# Patient Record
Sex: Female | Born: 1980
Health system: Southern US, Community
[De-identification: ages and names within clinical notes are randomized; demographics above are authoritative.]

## PROBLEM LIST (undated history)

## (undated) DIAGNOSIS — M549 Dorsalgia, unspecified: Secondary | ICD-10-CM

## (undated) DIAGNOSIS — Z87442 Personal history of urinary calculi: Secondary | ICD-10-CM

## (undated) DIAGNOSIS — Z789 Other specified health status: Secondary | ICD-10-CM

## (undated) DIAGNOSIS — F329 Major depressive disorder, single episode, unspecified: Secondary | ICD-10-CM

## (undated) DIAGNOSIS — D649 Anemia, unspecified: Secondary | ICD-10-CM

## (undated) DIAGNOSIS — F319 Bipolar disorder, unspecified: Secondary | ICD-10-CM

## (undated) DIAGNOSIS — D509 Iron deficiency anemia, unspecified: Secondary | ICD-10-CM

## (undated) DIAGNOSIS — K219 Gastro-esophageal reflux disease without esophagitis: Secondary | ICD-10-CM

## (undated) DIAGNOSIS — E282 Polycystic ovarian syndrome: Secondary | ICD-10-CM

## (undated) DIAGNOSIS — G8929 Other chronic pain: Secondary | ICD-10-CM

## (undated) DIAGNOSIS — T7840XA Allergy, unspecified, initial encounter: Secondary | ICD-10-CM

## (undated) DIAGNOSIS — M797 Fibromyalgia: Secondary | ICD-10-CM

## (undated) DIAGNOSIS — K56609 Unspecified intestinal obstruction, unspecified as to partial versus complete obstruction: Secondary | ICD-10-CM

## (undated) DIAGNOSIS — R51 Headache: Secondary | ICD-10-CM

## (undated) DIAGNOSIS — O24419 Gestational diabetes mellitus in pregnancy, unspecified control: Secondary | ICD-10-CM

## (undated) DIAGNOSIS — F32A Depression, unspecified: Secondary | ICD-10-CM

## (undated) DIAGNOSIS — E785 Hyperlipidemia, unspecified: Secondary | ICD-10-CM

## (undated) DIAGNOSIS — E079 Disorder of thyroid, unspecified: Secondary | ICD-10-CM

## (undated) DIAGNOSIS — E039 Hypothyroidism, unspecified: Secondary | ICD-10-CM

## (undated) DIAGNOSIS — E559 Vitamin D deficiency, unspecified: Secondary | ICD-10-CM

## (undated) DIAGNOSIS — R519 Headache, unspecified: Secondary | ICD-10-CM

## (undated) DIAGNOSIS — F909 Attention-deficit hyperactivity disorder, unspecified type: Secondary | ICD-10-CM

## (undated) DIAGNOSIS — M47816 Spondylosis without myelopathy or radiculopathy, lumbar region: Secondary | ICD-10-CM

## (undated) DIAGNOSIS — N3010 Interstitial cystitis (chronic) without hematuria: Secondary | ICD-10-CM

## (undated) DIAGNOSIS — F419 Anxiety disorder, unspecified: Secondary | ICD-10-CM

## (undated) DIAGNOSIS — Z9289 Personal history of other medical treatment: Secondary | ICD-10-CM

## (undated) DIAGNOSIS — K589 Irritable bowel syndrome without diarrhea: Secondary | ICD-10-CM

## (undated) HISTORY — DX: Polycystic ovarian syndrome: E28.2

## (undated) HISTORY — DX: Spondylosis without myelopathy or radiculopathy, lumbar region: M47.816

## (undated) HISTORY — DX: Vitamin D deficiency, unspecified: E55.9

## (undated) HISTORY — PX: SPINAL CORD STIMULATOR IMPLANT: SHX2422

## (undated) HISTORY — PX: COLONOSCOPY: SHX174

## (undated) HISTORY — PX: WISDOM TOOTH EXTRACTION: SHX21

## (undated) HISTORY — DX: Gastro-esophageal reflux disease without esophagitis: K21.9

## (undated) HISTORY — DX: Allergy, unspecified, initial encounter: T78.40XA

## (undated) HISTORY — PX: UPPER GI ENDOSCOPY: SHX6162

## (undated) HISTORY — DX: Interstitial cystitis (chronic) without hematuria: N30.10

## (undated) HISTORY — PX: SPINAL CORD STIMULATOR REMOVAL: SHX2423

## (undated) HISTORY — DX: Iron deficiency anemia, unspecified: D50.9

## (undated) HISTORY — PX: OTHER SURGICAL HISTORY: SHX169

## (undated) HISTORY — DX: Irritable bowel syndrome, unspecified: K58.9

---

## 2001-12-09 ENCOUNTER — Other Ambulatory Visit: Admission: RE | Admit: 2001-12-09 | Discharge: 2001-12-09 | Payer: Self-pay | Admitting: Obstetrics & Gynecology

## 2002-08-31 ENCOUNTER — Ambulatory Visit (HOSPITAL_BASED_OUTPATIENT_CLINIC_OR_DEPARTMENT_OTHER): Admission: RE | Admit: 2002-08-31 | Discharge: 2002-08-31 | Payer: Self-pay | Admitting: Urology

## 2002-12-25 ENCOUNTER — Ambulatory Visit (HOSPITAL_COMMUNITY): Admission: RE | Admit: 2002-12-25 | Discharge: 2002-12-25 | Payer: Self-pay | Admitting: Family Medicine

## 2002-12-25 ENCOUNTER — Other Ambulatory Visit: Admission: RE | Admit: 2002-12-25 | Discharge: 2002-12-25 | Payer: Self-pay | Admitting: Obstetrics & Gynecology

## 2002-12-25 ENCOUNTER — Encounter: Payer: Self-pay | Admitting: Family Medicine

## 2003-03-13 ENCOUNTER — Emergency Department (HOSPITAL_COMMUNITY): Admission: EM | Admit: 2003-03-13 | Discharge: 2003-03-13 | Payer: Self-pay | Admitting: Emergency Medicine

## 2003-06-21 ENCOUNTER — Ambulatory Visit (HOSPITAL_COMMUNITY): Admission: RE | Admit: 2003-06-21 | Discharge: 2003-06-21 | Payer: Self-pay | Admitting: Urology

## 2003-06-21 ENCOUNTER — Ambulatory Visit (HOSPITAL_BASED_OUTPATIENT_CLINIC_OR_DEPARTMENT_OTHER): Admission: RE | Admit: 2003-06-21 | Discharge: 2003-06-21 | Payer: Self-pay | Admitting: Urology

## 2003-12-21 ENCOUNTER — Other Ambulatory Visit: Admission: RE | Admit: 2003-12-21 | Discharge: 2003-12-21 | Payer: Self-pay | Admitting: Obstetrics & Gynecology

## 2004-01-19 ENCOUNTER — Ambulatory Visit (HOSPITAL_BASED_OUTPATIENT_CLINIC_OR_DEPARTMENT_OTHER): Admission: RE | Admit: 2004-01-19 | Discharge: 2004-01-19 | Payer: Self-pay | Admitting: Urology

## 2004-08-02 ENCOUNTER — Ambulatory Visit (HOSPITAL_COMMUNITY): Admission: RE | Admit: 2004-08-02 | Discharge: 2004-08-02 | Payer: Self-pay | Admitting: Urology

## 2004-08-02 ENCOUNTER — Ambulatory Visit (HOSPITAL_BASED_OUTPATIENT_CLINIC_OR_DEPARTMENT_OTHER): Admission: RE | Admit: 2004-08-02 | Discharge: 2004-08-02 | Payer: Self-pay | Admitting: Urology

## 2004-12-27 ENCOUNTER — Other Ambulatory Visit: Admission: RE | Admit: 2004-12-27 | Discharge: 2004-12-27 | Payer: Self-pay | Admitting: Obstetrics & Gynecology

## 2006-01-23 ENCOUNTER — Ambulatory Visit (HOSPITAL_COMMUNITY): Admission: RE | Admit: 2006-01-23 | Discharge: 2006-01-23 | Payer: Self-pay | Admitting: Obstetrics & Gynecology

## 2007-03-06 DIAGNOSIS — O1490 Unspecified pre-eclampsia, unspecified trimester: Secondary | ICD-10-CM

## 2007-03-06 HISTORY — DX: Unspecified pre-eclampsia, unspecified trimester: O14.90

## 2007-04-08 ENCOUNTER — Encounter: Admission: RE | Admit: 2007-04-08 | Discharge: 2007-04-14 | Payer: Self-pay | Admitting: Obstetrics and Gynecology

## 2007-04-16 ENCOUNTER — Inpatient Hospital Stay (HOSPITAL_COMMUNITY): Admission: AD | Admit: 2007-04-16 | Discharge: 2007-04-16 | Payer: Self-pay | Admitting: Obstetrics & Gynecology

## 2007-04-17 ENCOUNTER — Inpatient Hospital Stay (HOSPITAL_COMMUNITY): Admission: AD | Admit: 2007-04-17 | Discharge: 2007-04-17 | Payer: Self-pay | Admitting: Obstetrics & Gynecology

## 2007-04-18 ENCOUNTER — Inpatient Hospital Stay (HOSPITAL_COMMUNITY): Admission: AD | Admit: 2007-04-18 | Discharge: 2007-04-23 | Payer: Self-pay | Admitting: Obstetrics and Gynecology

## 2007-04-20 ENCOUNTER — Encounter (INDEPENDENT_AMBULATORY_CARE_PROVIDER_SITE_OTHER): Payer: Self-pay | Admitting: Obstetrics and Gynecology

## 2007-04-24 ENCOUNTER — Encounter: Admission: RE | Admit: 2007-04-24 | Discharge: 2007-05-21 | Payer: Self-pay | Admitting: Urology

## 2007-05-22 ENCOUNTER — Encounter: Admission: RE | Admit: 2007-05-22 | Discharge: 2007-06-21 | Payer: Self-pay | Admitting: Obstetrics & Gynecology

## 2007-07-17 ENCOUNTER — Encounter: Admission: RE | Admit: 2007-07-17 | Discharge: 2007-07-17 | Payer: Self-pay | Admitting: Rheumatology

## 2008-06-03 DIAGNOSIS — N3010 Interstitial cystitis (chronic) without hematuria: Secondary | ICD-10-CM

## 2008-06-03 HISTORY — DX: Interstitial cystitis (chronic) without hematuria: N30.10

## 2009-02-17 ENCOUNTER — Emergency Department (HOSPITAL_COMMUNITY): Admission: EM | Admit: 2009-02-17 | Discharge: 2009-02-17 | Payer: Self-pay | Admitting: Emergency Medicine

## 2010-02-02 DIAGNOSIS — Z9289 Personal history of other medical treatment: Secondary | ICD-10-CM

## 2010-02-02 HISTORY — PX: ABDOMINAL HYSTERECTOMY: SHX81

## 2010-02-02 HISTORY — DX: Personal history of other medical treatment: Z92.89

## 2010-02-02 HISTORY — PX: APPENDECTOMY: SHX54

## 2010-02-02 HISTORY — PX: BLADDER REMOVAL: SHX567

## 2010-03-26 ENCOUNTER — Encounter: Payer: Self-pay | Admitting: Internal Medicine

## 2010-06-05 LAB — CBC
HCT: 42 % (ref 36.0–46.0)
MCV: 84.1 fL (ref 78.0–100.0)
Platelets: 291 10*3/uL (ref 150–400)
RBC: 5 MIL/uL (ref 3.87–5.11)
WBC: 10.3 10*3/uL (ref 4.0–10.5)

## 2010-06-05 LAB — COMPREHENSIVE METABOLIC PANEL
ALT: 22 U/L (ref 0–35)
AST: 23 U/L (ref 0–37)
Albumin: 3.7 g/dL (ref 3.5–5.2)
Alkaline Phosphatase: 84 U/L (ref 39–117)
Chloride: 103 mEq/L (ref 96–112)
Potassium: 3.7 mEq/L (ref 3.5–5.1)
Sodium: 134 mEq/L — ABNORMAL LOW (ref 135–145)
Total Bilirubin: 0.3 mg/dL (ref 0.3–1.2)
Total Protein: 7.1 g/dL (ref 6.0–8.3)

## 2010-06-05 LAB — DIFFERENTIAL
Eosinophils Absolute: 0.2 10*3/uL (ref 0.0–0.7)
Eosinophils Relative: 2 % (ref 0–5)
Lymphocytes Relative: 37 % (ref 12–46)
Lymphs Abs: 3.8 10*3/uL (ref 0.7–4.0)
Monocytes Relative: 8 % (ref 3–12)
Neutrophils Relative %: 52 % (ref 43–77)

## 2010-06-05 LAB — URINALYSIS, ROUTINE W REFLEX MICROSCOPIC
Glucose, UA: NEGATIVE mg/dL
Nitrite: NEGATIVE
Specific Gravity, Urine: 1.035 — ABNORMAL HIGH (ref 1.005–1.030)
pH: 7.5 (ref 5.0–8.0)

## 2010-06-05 LAB — PREGNANCY, URINE: Preg Test, Ur: NEGATIVE

## 2010-07-18 NOTE — Discharge Summary (Signed)
NAMEJORDIE, [name]            ACCOUNT NO.:  [id number]   MEDICAL RECORD NO.:  [id number]          PATIENT TYPE:  INP   LOCATION:  9306                          FACILITY:  [facility]   PHYSICIAN:  Carrington Clamp, M.D. DATE OF BIRTH:  [birth year]   DATE OF ADMISSION:  04/18/2007  DATE OF DISCHARGE:  04/23/2007                               DISCHARGE SUMMARY   FINAL DIAGNOSES:  1. Intrauterine gestation at 30 weeks.  2. Severe preeclampsia.  3. Unfavorable cervix.   PROCEDURE:  Primary low transverse cesarean section.  Surgeon Dr. Malva Limes.   COMPLICATIONS:  None.   HISTORY OF PRESENT ILLNESS:  This 30 year old G2, P 0-0-1-0 presents at  [redacted] weeks gestation and is admitted for preeclampsia.  The patient had  some elevated blood pressures in the office and had a 24-hour urine with  over close to 1680 grams of protein, pH labs at this point were okay.  The patient does have a history of hypothyroidism, depression,  fibromyalgia and gestational diabetes which is diet controlled at this  point.  The patient was admitted, started on Procardia, bed rest and  betamethasone protocol and to be monitored closely.  Left fetal maternal  medicine consult was obtained and ultrasound was performed.  Checked  with an AFI.  The patient was started on mag sulfate for prophylaxis.  She did have some elevated uric acid starting the patient on her PIH  labs.  The patient now starts having symptoms of headache and visual  changes at this point.  Because of this, will proceed with a cesarean  section.  The patient was taken to the operating room on April 20, 2007 by Dr. Malva Limes where a primary low transverse cesarean  section was performed with the delivery of a 3 pounds 2 ounces female  infant with Apgars of 7 and 9.  The NICU was awaiting and took the baby.  The patient was in stable condition.  The patient's postoperative course  was complicated by continued lab changes and elevated  blood pressure.  The patient was kept on mag sulfate and by postoperative day #2 the  patient was feeling okay.  She had good urine output and blood pressures  looked stable.  Mag sulfate was stopped.  The patient was started on  oral Procardia XL 60 mg daily and transferred to the Mother/Baby unit.  Postoperative day #3, the patient was still voiding well.  Liver  function tests and uric acid had returned to normal.  The patient was  felt ready for discharge.  She was sent home on a regular diet, told to  decrease activities, told to continue her Procardia XL 60 mg to take  daily.  Was given Percocet 1-2 every 4-6 hours as needed for her pain.  Follow-up in our office in 1-2 weeks for blood pressure check.  Instructions and precautions were reviewed with the patient as well.   LABORATORIES ON DISCHARGE:  The patient had a hemoglobin of 11.9, white  blood cell count of 15.6, platelets 264,000.  Her final labs on February  17 showed continued normal liver function  tests, normal LDH as well as a  normalizing uric acid level.      Leilani Able, P.A.-C.      Carrington Clamp, M.D.  Electronically Signed    MB/MEDQ  D:  05/13/2007  T:  05/14/2007  Job:  [id number]

## 2010-07-18 NOTE — Op Note (Signed)
[name], [name]            ACCOUNT NO.:  [id number]   MEDICAL RECORD NO.:  [id number]          PATIENT TYPE:  INP   LOCATION:  9371                          FACILITY:  [facility]   PHYSICIAN:  Malva Limes, M.D.    DATE OF BIRTH:  1980/06/04   DATE OF PROCEDURE:  04/20/2007  DATE OF DISCHARGE:                               OPERATIVE REPORT   PREOPERATIVE DIAGNOSES:  1. Intrauterine pregnancy at 30 weeks estimated gestational age.  2. Severe pre-eclampsia.  3. Unfavorable cervix.   POSTOPERATIVE DIAGNOSES:  1. Intrauterine pregnancy at 30 weeks estimated gestational age.  2. Severe pre-eclampsia.  3. Unfavorable cervix.   PROCEDURE:  Primary low transverse Cesarean section.   SURGEON:  Malva Limes, MD   ANESTHESIA:  Spinal.   ANTIBIOTICS:  Clindamycin 900 mg IV x1.   DRAINS:  Foley to bedside drainage.   ESTIMATED BLOOD LOSS:  900 mL.   COMPLICATIONS:  None.   FINDINGS:  The patient had normal Fallopian tubes and ovaries  bilaterally.  The uterus appeared to be normal.  The patient delivered 1  live viable white female infant in the vertex presentation.  Amniotic  fluid was noted to be clear.   PROCEDURE IN DETAIL:  The patient was taken to the operating room where  a spinal anesthetic was administered without difficulty.  She was then  placed in dorsal supine position.  Once an adequate level was reached,  she was prepped and draped in the usual fashion for this procedure.  A  Foley catheter was placed.  A Pfannenstiel incision was then made.  On  entering the peritoneal cavity the bladder flap was taken down with  sharp dissection.  A low transverse uterine incision was made in the  midline and extended laterally with blunt dissection.  Amniotic fluid  was noted to be clear.  The infant was delivered in the vertex  presentation.  The cords doubly clamped and cut, and the infant handed  to the awaiting NICU team.   The placenta was then manually removed.  The  uterus was exteriorized.  The uterine cavity was cleaned with a wet lap.  The uterine incision was  closed in a single layer of 0 Monocryl suture in a running, locking  fashion.  The bladder flap was closed using 0 Monocryl suture in a  running fashion.  The uterus was placed back in the abdominal cavity.  Hemostasis was checked and felt to be adequate.  The parietoperitoneum  was then closed using 2-0 Monocryl in a running fashion.  The rectus  muscles were reapproximated in the midline using 2-0 Monocryl in a  running fashion.  The fascia was then closed using 0 Monocryl suture in  a running fashion.  The subcuticular tissue was made hemostatic with a  Bovie.  The subcuticular tissue was closed with interrupted 0 plain gut  suture.  Stainless steel clips were used to close the skin.  The patient  tolerated the procedure well.  She was taken to the recovery room in  stable condition.  Instrument and lap counts are correct x2.  ______________________________  Malva Limes, M.D.     MA/MEDQ  D:  04/20/2007  T:  04/22/2007  Job:  [id number]

## 2010-11-24 LAB — CBC
HCT: 37.4
HCT: 32.6 — ABNORMAL LOW
Hemoglobin: 12.7
MCHC: 33.9
MCHC: 34.2
MCHC: 34.6
MCHC: 34.7
MCHC: 35
MCV: 83.1
MCV: 82.6
MCV: 83
Platelets: 256
Platelets: 291
Platelets: 306
Platelets: 328
RBC: 4.53
RBC: 3.94
RBC: 4.53
RDW: 13.7
RDW: 13.7
RDW: 13.7
WBC: 20.6 — ABNORMAL HIGH

## 2010-11-24 LAB — COMPREHENSIVE METABOLIC PANEL
ALT: 23
ALT: 16
ALT: 19
ALT: 21
AST: 20
AST: 26
AST: 28
Albumin: 2.6 — ABNORMAL LOW
Albumin: 2.1 — ABNORMAL LOW
Albumin: 2.6 — ABNORMAL LOW
Alkaline Phosphatase: 67
Alkaline Phosphatase: 64
BUN: 17
CO2: 22
CO2: 24
CO2: 23
Calcium: 9.5
Calcium: 8.2 — ABNORMAL LOW
Calcium: 8.7
Chloride: 102
Chloride: 103
Creatinine, Ser: 0.71
Creatinine, Ser: 0.7
Creatinine, Ser: 0.82
GFR calc Af Amer: >60
GFR calc Af Amer: >60
GFR calc Af Amer: >60
GFR calc non Af Amer: >60
GFR calc non Af Amer: >60
GFR calc non Af Amer: >60
Glucose, Bld: 76
Potassium: 4
Potassium: 4.1
Sodium: 136
Sodium: 132 — ABNORMAL LOW
Sodium: 133 — ABNORMAL LOW
Sodium: 137
Total Bilirubin: 0.3
Total Protein: 5.7 — ABNORMAL LOW
Total Protein: 4.5 — ABNORMAL LOW
Total Protein: 5.5 — ABNORMAL LOW

## 2010-11-24 LAB — PROTEIN, URINE, 24 HOUR
Collection Interval-UPROT: 24
Collection Interval-UPROT: 24
Protein, Urine: 48
Protein, Urine: 223
Urine Total Volume-UPROT: 3500

## 2010-11-24 LAB — GLUCOSE, RANDOM: Glucose, Bld: 85

## 2010-11-24 LAB — LACTATE DEHYDROGENASE
LDH: 180
LDH: 148
LDH: 223

## 2010-11-24 LAB — MAGNESIUM: Magnesium: 5.7 — ABNORMAL HIGH

## 2010-11-24 LAB — URIC ACID: Uric Acid, Serum: 7.2 — ABNORMAL HIGH

## 2010-11-24 LAB — CREATININE CLEARANCE, URINE, 24 HOUR
Collection Interval-CRCL: 24
Creatinine Clearance: 137 — ABNORMAL HIGH
Creatinine Clearance: 141 — ABNORMAL HIGH
Creatinine, 24H Ur: 1404
Urine Total Volume-CRCL: 3500
Urine Total Volume-CRCL: 4300

## 2010-12-22 ENCOUNTER — Emergency Department (HOSPITAL_COMMUNITY)
Admission: EM | Admit: 2010-12-22 | Discharge: 2010-12-22 | Disposition: A | Discharge status: Home / Self Care | Payer: 59 | Attending: Emergency Medicine | Admitting: Emergency Medicine

## 2010-12-22 ENCOUNTER — Emergency Department (HOSPITAL_COMMUNITY): Payer: 59

## 2010-12-22 DIAGNOSIS — R109 Unspecified abdominal pain: Secondary | ICD-10-CM | POA: Insufficient documentation

## 2010-12-22 DIAGNOSIS — IMO0001 Reserved for inherently not codable concepts without codable children: Secondary | ICD-10-CM | POA: Insufficient documentation

## 2010-12-22 DIAGNOSIS — E039 Hypothyroidism, unspecified: Secondary | ICD-10-CM | POA: Insufficient documentation

## 2010-12-22 DIAGNOSIS — N760 Acute vaginitis: Secondary | ICD-10-CM | POA: Insufficient documentation

## 2010-12-22 DIAGNOSIS — F329 Major depressive disorder, single episode, unspecified: Secondary | ICD-10-CM | POA: Insufficient documentation

## 2010-12-22 DIAGNOSIS — R6889 Other general symptoms and signs: Secondary | ICD-10-CM | POA: Insufficient documentation

## 2010-12-22 DIAGNOSIS — F3289 Other specified depressive episodes: Secondary | ICD-10-CM | POA: Insufficient documentation

## 2010-12-22 DIAGNOSIS — Z79899 Other long term (current) drug therapy: Secondary | ICD-10-CM | POA: Insufficient documentation

## 2010-12-22 LAB — WET PREP, GENITAL: Trich, Wet Prep: NONE SEEN

## 2010-12-22 LAB — CBC
MCV: 85.4 fL (ref 78.0–100.0)
Platelets: 289 10*3/uL (ref 150–400)
RBC: 4.72 MIL/uL (ref 3.87–5.11)
WBC: 8.9 10*3/uL (ref 4.0–10.5)

## 2010-12-22 LAB — BASIC METABOLIC PANEL
CO2: 22 mEq/L (ref 19–32)
Chloride: 108 mEq/L (ref 96–112)
Creatinine, Ser: 0.8 mg/dL (ref 0.50–1.10)
Potassium: 4.2 mEq/L (ref 3.5–5.1)
Sodium: 138 mEq/L (ref 135–145)

## 2010-12-22 LAB — DIFFERENTIAL
Eosinophils Absolute: 0.1 10*3/uL (ref 0.0–0.7)
Lymphocytes Relative: 27 % (ref 12–46)
Lymphs Abs: 2.4 10*3/uL (ref 0.7–4.0)
Neutrophils Relative %: 65 % (ref 43–77)

## 2010-12-23 LAB — GC/CHLAMYDIA PROBE AMP, GENITAL: GC Probe Amp, Genital: NEGATIVE

## 2011-02-21 DIAGNOSIS — M791 Myalgia, unspecified site: Secondary | ICD-10-CM | POA: Insufficient documentation

## 2011-02-21 DIAGNOSIS — Z906 Acquired absence of other parts of urinary tract: Secondary | ICD-10-CM | POA: Insufficient documentation

## 2011-03-27 DIAGNOSIS — F319 Bipolar disorder, unspecified: Secondary | ICD-10-CM | POA: Insufficient documentation

## 2013-04-07 ENCOUNTER — Other Ambulatory Visit: Payer: Self-pay | Admitting: Dermatology

## 2013-10-06 DIAGNOSIS — M25511 Pain in right shoulder: Secondary | ICD-10-CM | POA: Insufficient documentation

## 2013-10-06 DIAGNOSIS — R1013 Epigastric pain: Secondary | ICD-10-CM | POA: Insufficient documentation

## 2014-02-16 ENCOUNTER — Emergency Department (HOSPITAL_COMMUNITY): Payer: No Typology Code available for payment source

## 2014-02-16 ENCOUNTER — Encounter (HOSPITAL_COMMUNITY): Payer: Self-pay

## 2014-02-16 ENCOUNTER — Emergency Department (HOSPITAL_COMMUNITY)
Admission: EM | Admit: 2014-02-16 | Discharge: 2014-02-16 | Disposition: A | Discharge status: Home / Self Care | Payer: No Typology Code available for payment source | Attending: Emergency Medicine | Admitting: Emergency Medicine

## 2014-02-16 DIAGNOSIS — R1031 Right lower quadrant pain: Secondary | ICD-10-CM

## 2014-02-16 DIAGNOSIS — Z8739 Personal history of other diseases of the musculoskeletal system and connective tissue: Secondary | ICD-10-CM | POA: Diagnosis not present

## 2014-02-16 DIAGNOSIS — Z880 Allergy status to penicillin: Secondary | ICD-10-CM | POA: Insufficient documentation

## 2014-02-16 DIAGNOSIS — R52 Pain, unspecified: Secondary | ICD-10-CM

## 2014-02-16 DIAGNOSIS — Y92410 Unspecified street and highway as the place of occurrence of the external cause: Secondary | ICD-10-CM | POA: Diagnosis not present

## 2014-02-16 DIAGNOSIS — Y9389 Activity, other specified: Secondary | ICD-10-CM | POA: Diagnosis not present

## 2014-02-16 DIAGNOSIS — S50811A Abrasion of right forearm, initial encounter: Secondary | ICD-10-CM | POA: Diagnosis not present

## 2014-02-16 DIAGNOSIS — S3991XA Unspecified injury of abdomen, initial encounter: Secondary | ICD-10-CM | POA: Diagnosis not present

## 2014-02-16 DIAGNOSIS — S299XXA Unspecified injury of thorax, initial encounter: Secondary | ICD-10-CM | POA: Diagnosis present

## 2014-02-16 DIAGNOSIS — S20211A Contusion of right front wall of thorax, initial encounter: Secondary | ICD-10-CM | POA: Diagnosis not present

## 2014-02-16 DIAGNOSIS — Y998 Other external cause status: Secondary | ICD-10-CM | POA: Insufficient documentation

## 2014-02-16 HISTORY — DX: Fibromyalgia: M79.7

## 2014-02-16 LAB — COMPREHENSIVE METABOLIC PANEL
ALK PHOS: 82 U/L (ref 39–117)
ALT: 39 U/L — AB (ref 0–35)
AST: 33 U/L (ref 0–37)
Albumin: 4.3 g/dL (ref 3.5–5.2)
Anion gap: 14 (ref 5–15)
BUN: 18 mg/dL (ref 6–23)
CO2: 19 mEq/L (ref 19–32)
Calcium: 9.3 mg/dL (ref 8.4–10.5)
Chloride: 104 mEq/L (ref 96–112)
Creatinine, Ser: 0.78 mg/dL (ref 0.50–1.10)
GFR calc Af Amer: >90 mL/min (ref >90)
GFR calc non Af Amer: >90 mL/min (ref >90)
Glucose, Bld: 101 mg/dL — ABNORMAL HIGH (ref 70–99)
Potassium: 4.4 mEq/L (ref 3.7–5.3)
SODIUM: 137 meq/L (ref 137–147)
TOTAL PROTEIN: 8.4 g/dL — AB (ref 6.0–8.3)
Total Bilirubin: <0.2 mg/dL — ABNORMAL LOW (ref 0.3–1.2)

## 2014-02-16 LAB — CBC WITH DIFFERENTIAL/PLATELET
Basophils Absolute: 0 10*3/uL (ref 0.0–0.1)
Basophils Relative: 0 % (ref 0–1)
EOS ABS: 0 10*3/uL (ref 0.0–0.7)
Eosinophils Relative: 0 % (ref 0–5)
HCT: 40.9 % (ref 36.0–46.0)
Hemoglobin: 13.2 g/dL (ref 12.0–15.0)
Lymphocytes Relative: 16 % (ref 12–46)
Lymphs Abs: 2 10*3/uL (ref 0.7–4.0)
MCH: 27.4 pg (ref 26.0–34.0)
MCHC: 32.3 g/dL (ref 30.0–36.0)
MCV: 85 fL (ref 78.0–100.0)
Monocytes Absolute: 1 10*3/uL (ref 0.1–1.0)
Monocytes Relative: 8 % (ref 3–12)
NEUTROS PCT: 76 % (ref 43–77)
Neutro Abs: 9.9 10*3/uL — ABNORMAL HIGH (ref 1.7–7.7)
PLATELETS: 358 10*3/uL (ref 150–400)
RBC: 4.81 MIL/uL (ref 3.87–5.11)
RDW: 13 % (ref 11.5–15.5)
WBC: 12.9 10*3/uL — ABNORMAL HIGH (ref 4.0–10.5)

## 2014-02-16 MED ORDER — IOHEXOL 300 MG/ML  SOLN
100.0000 mL | Freq: Once | INTRAMUSCULAR | Status: AC | PRN
  Administered 2014-02-16: 100 mL via INTRAVENOUS

## 2014-02-16 MED ORDER — OXYCODONE-ACETAMINOPHEN 5-325 MG PO TABS: 2.0000 | ORAL_TABLET | ORAL | Status: DC | PRN

## 2014-02-16 MED ORDER — HYDROMORPHONE HCL 1 MG/ML IJ SOLN
1.0000 mg | Freq: Once | INTRAMUSCULAR | Status: AC
  Administered 2014-02-16: 1 mg via INTRAVENOUS
  Filled 2014-02-16: qty 1
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
## 2014-02-16 NOTE — ED Notes (Signed)
CT notified pt had hysterectomy, will not need urine preg.
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
## 2014-02-16 NOTE — ED Notes (Signed)
Pt a/o x 4 on d/c with steady gait. 

## 2014-10-07 ENCOUNTER — Other Ambulatory Visit: Payer: Self-pay | Admitting: Obstetrics & Gynecology

## 2014-10-07 DIAGNOSIS — N644 Mastodynia: Secondary | ICD-10-CM

## 2014-10-18 DIAGNOSIS — R319 Hematuria, unspecified: Secondary | ICD-10-CM | POA: Insufficient documentation

## 2014-10-27 ENCOUNTER — Ambulatory Visit
Admission: RE | Admit: 2014-10-27 | Discharge: 2014-10-27 | Disposition: A | Discharge status: Home / Self Care | Payer: BLUE CROSS/BLUE SHIELD | Source: Ambulatory Visit | Attending: Obstetrics & Gynecology | Admitting: Obstetrics & Gynecology

## 2014-10-27 DIAGNOSIS — N644 Mastodynia: Secondary | ICD-10-CM

## 2015-03-06 HISTORY — PX: SMALL INTESTINE SURGERY: SHX150

## 2015-06-06 DIAGNOSIS — G8929 Other chronic pain: Secondary | ICD-10-CM | POA: Diagnosis not present

## 2015-06-06 DIAGNOSIS — G894 Chronic pain syndrome: Secondary | ICD-10-CM | POA: Diagnosis not present

## 2015-06-06 DIAGNOSIS — M25511 Pain in right shoulder: Secondary | ICD-10-CM | POA: Diagnosis not present

## 2015-06-06 DIAGNOSIS — R102 Pelvic and perineal pain: Secondary | ICD-10-CM | POA: Diagnosis not present

## 2015-06-24 DIAGNOSIS — E781 Pure hyperglyceridemia: Secondary | ICD-10-CM | POA: Diagnosis not present

## 2015-06-24 DIAGNOSIS — E039 Hypothyroidism, unspecified: Secondary | ICD-10-CM | POA: Diagnosis not present

## 2015-06-24 DIAGNOSIS — R339 Retention of urine, unspecified: Secondary | ICD-10-CM | POA: Diagnosis not present

## 2015-06-27 DIAGNOSIS — R945 Abnormal results of liver function studies: Secondary | ICD-10-CM | POA: Diagnosis not present

## 2015-06-27 DIAGNOSIS — E781 Pure hyperglyceridemia: Secondary | ICD-10-CM | POA: Diagnosis not present

## 2015-06-27 DIAGNOSIS — E039 Hypothyroidism, unspecified: Secondary | ICD-10-CM | POA: Diagnosis not present

## 2015-06-27 DIAGNOSIS — E119 Type 2 diabetes mellitus without complications: Secondary | ICD-10-CM | POA: Diagnosis not present

## 2015-06-30 DIAGNOSIS — Z5181 Encounter for therapeutic drug level monitoring: Secondary | ICD-10-CM | POA: Diagnosis not present

## 2015-06-30 DIAGNOSIS — G894 Chronic pain syndrome: Secondary | ICD-10-CM | POA: Diagnosis not present

## 2015-06-30 DIAGNOSIS — Z79899 Other long term (current) drug therapy: Secondary | ICD-10-CM | POA: Diagnosis not present

## 2015-06-30 DIAGNOSIS — M25511 Pain in right shoulder: Secondary | ICD-10-CM | POA: Diagnosis not present

## 2015-06-30 DIAGNOSIS — G8929 Other chronic pain: Secondary | ICD-10-CM | POA: Diagnosis not present

## 2015-06-30 DIAGNOSIS — R102 Pelvic and perineal pain: Secondary | ICD-10-CM | POA: Diagnosis not present

## 2015-07-25 DIAGNOSIS — R339 Retention of urine, unspecified: Secondary | ICD-10-CM | POA: Diagnosis not present

## 2015-07-28 DIAGNOSIS — G894 Chronic pain syndrome: Secondary | ICD-10-CM | POA: Diagnosis not present

## 2015-07-28 DIAGNOSIS — M25511 Pain in right shoulder: Secondary | ICD-10-CM | POA: Diagnosis not present

## 2015-07-28 DIAGNOSIS — G8929 Other chronic pain: Secondary | ICD-10-CM | POA: Diagnosis not present

## 2015-07-28 DIAGNOSIS — R102 Pelvic and perineal pain: Secondary | ICD-10-CM | POA: Diagnosis not present

## 2015-08-03 ENCOUNTER — Observation Stay (HOSPITAL_COMMUNITY): Payer: BLUE CROSS/BLUE SHIELD

## 2015-08-03 ENCOUNTER — Inpatient Hospital Stay (HOSPITAL_COMMUNITY)
Admission: EM | Admit: 2015-08-03 | Discharge: 2015-08-05 | DRG: 390 | Disposition: A | Discharge status: Home / Self Care | Payer: BLUE CROSS/BLUE SHIELD | Attending: Internal Medicine | Admitting: Internal Medicine

## 2015-08-03 ENCOUNTER — Encounter (HOSPITAL_COMMUNITY): Payer: Self-pay | Admitting: *Deleted

## 2015-08-03 ENCOUNTER — Emergency Department (HOSPITAL_COMMUNITY): Payer: BLUE CROSS/BLUE SHIELD

## 2015-08-03 DIAGNOSIS — Z906 Acquired absence of other parts of urinary tract: Secondary | ICD-10-CM | POA: Diagnosis not present

## 2015-08-03 DIAGNOSIS — F909 Attention-deficit hyperactivity disorder, unspecified type: Secondary | ICD-10-CM

## 2015-08-03 DIAGNOSIS — G4700 Insomnia, unspecified: Secondary | ICD-10-CM | POA: Diagnosis present

## 2015-08-03 DIAGNOSIS — F419 Anxiety disorder, unspecified: Secondary | ICD-10-CM | POA: Diagnosis present

## 2015-08-03 DIAGNOSIS — K56609 Unspecified intestinal obstruction, unspecified as to partial versus complete obstruction: Secondary | ICD-10-CM | POA: Diagnosis present

## 2015-08-03 DIAGNOSIS — E669 Obesity, unspecified: Secondary | ICD-10-CM | POA: Diagnosis present

## 2015-08-03 DIAGNOSIS — R109 Unspecified abdominal pain: Secondary | ICD-10-CM

## 2015-08-03 DIAGNOSIS — K5660 Unspecified intestinal obstruction: Secondary | ICD-10-CM | POA: Diagnosis not present

## 2015-08-03 DIAGNOSIS — E039 Hypothyroidism, unspecified: Secondary | ICD-10-CM

## 2015-08-03 DIAGNOSIS — R933 Abnormal findings on diagnostic imaging of other parts of digestive tract: Secondary | ICD-10-CM | POA: Diagnosis not present

## 2015-08-03 DIAGNOSIS — M797 Fibromyalgia: Secondary | ICD-10-CM | POA: Diagnosis not present

## 2015-08-03 DIAGNOSIS — Z0189 Encounter for other specified special examinations: Secondary | ICD-10-CM

## 2015-08-03 DIAGNOSIS — E785 Hyperlipidemia, unspecified: Secondary | ICD-10-CM | POA: Diagnosis present

## 2015-08-03 DIAGNOSIS — L680 Hirsutism: Secondary | ICD-10-CM | POA: Diagnosis not present

## 2015-08-03 DIAGNOSIS — F329 Major depressive disorder, single episode, unspecified: Secondary | ICD-10-CM | POA: Diagnosis present

## 2015-08-03 DIAGNOSIS — Z6837 Body mass index (BMI) 37.0-37.9, adult: Secondary | ICD-10-CM

## 2015-08-03 DIAGNOSIS — K5669 Other intestinal obstruction: Secondary | ICD-10-CM | POA: Diagnosis not present

## 2015-08-03 DIAGNOSIS — K567 Ileus, unspecified: Principal | ICD-10-CM | POA: Diagnosis present

## 2015-08-03 DIAGNOSIS — Z79899 Other long term (current) drug therapy: Secondary | ICD-10-CM

## 2015-08-03 DIAGNOSIS — R140 Abdominal distension (gaseous): Secondary | ICD-10-CM | POA: Diagnosis not present

## 2015-08-03 DIAGNOSIS — F32A Depression, unspecified: Secondary | ICD-10-CM

## 2015-08-03 DIAGNOSIS — K651 Peritoneal abscess: Secondary | ICD-10-CM | POA: Diagnosis not present

## 2015-08-03 DIAGNOSIS — R1084 Generalized abdominal pain: Secondary | ICD-10-CM | POA: Diagnosis not present

## 2015-08-03 DIAGNOSIS — K562 Volvulus: Secondary | ICD-10-CM | POA: Diagnosis not present

## 2015-08-03 HISTORY — DX: Unspecified intestinal obstruction, unspecified as to partial versus complete obstruction: K56.609

## 2015-08-03 HISTORY — DX: Depression, unspecified: F32.A

## 2015-08-03 HISTORY — DX: Anxiety disorder, unspecified: F41.9

## 2015-08-03 HISTORY — DX: Headache: R51

## 2015-08-03 HISTORY — DX: Disorder of thyroid, unspecified: E07.9

## 2015-08-03 HISTORY — DX: Attention-deficit hyperactivity disorder, unspecified type: F90.9

## 2015-08-03 HISTORY — DX: Major depressive disorder, single episode, unspecified: F32.9

## 2015-08-03 HISTORY — DX: Personal history of other medical treatment: Z92.89

## 2015-08-03 HISTORY — DX: Hypothyroidism, unspecified: E03.9

## 2015-08-03 HISTORY — DX: Bipolar disorder, unspecified: F31.9

## 2015-08-03 HISTORY — DX: Other chronic pain: G89.29

## 2015-08-03 HISTORY — DX: Interstitial cystitis (chronic) without hematuria: N30.10

## 2015-08-03 HISTORY — DX: Hyperlipidemia, unspecified: E78.5

## 2015-08-03 HISTORY — DX: Other specified health status: Z78.9

## 2015-08-03 HISTORY — DX: Dorsalgia, unspecified: M54.9

## 2015-08-03 HISTORY — DX: Headache, unspecified: R51.9

## 2015-08-03 LAB — COMPREHENSIVE METABOLIC PANEL
ALT: 26 U/L (ref 14–54)
AST: 22 U/L (ref 15–41)
Albumin: 3.1 g/dL — ABNORMAL LOW (ref 3.5–5.0)
Alkaline Phosphatase: 61 U/L (ref 38–126)
Anion gap: 7 (ref 5–15)
BUN: 9 mg/dL (ref 6–20)
CHLORIDE: 108 mmol/L (ref 101–111)
CO2: 19 mmol/L — AB (ref 22–32)
Calcium: 8.6 mg/dL — ABNORMAL LOW (ref 8.9–10.3)
Creatinine, Ser: 0.88 mg/dL (ref 0.44–1.00)
GFR calc Af Amer: >60 mL/min (ref >60)
Glucose, Bld: 122 mg/dL — ABNORMAL HIGH (ref 65–99)
POTASSIUM: 3.8 mmol/L (ref 3.5–5.1)
Sodium: 134 mmol/L — ABNORMAL LOW (ref 135–145)
Total Bilirubin: 0.5 mg/dL (ref 0.3–1.2)
Total Protein: 6.8 g/dL (ref 6.5–8.1)

## 2015-08-03 LAB — LIPASE, BLOOD: LIPASE: 18 U/L (ref 11–51)

## 2015-08-03 LAB — CBC WITH DIFFERENTIAL/PLATELET
Basophils Absolute: 0 10*3/uL (ref 0.0–0.1)
Basophils Relative: 0 %
Eosinophils Absolute: 0.1 10*3/uL (ref 0.0–0.7)
Eosinophils Relative: 1 %
HCT: 36 % (ref 36.0–46.0)
Hemoglobin: 11.3 g/dL — ABNORMAL LOW (ref 12.0–15.0)
Lymphocytes Relative: 13 %
Lymphs Abs: 1.8 10*3/uL (ref 0.7–4.0)
MCH: 27.6 pg (ref 26.0–34.0)
MCHC: 31.4 g/dL (ref 30.0–36.0)
MCV: 88 fL (ref 78.0–100.0)
Monocytes Absolute: 1.3 10*3/uL — ABNORMAL HIGH (ref 0.1–1.0)
Monocytes Relative: 9 %
Neutro Abs: 10.6 10*3/uL — ABNORMAL HIGH (ref 1.7–7.7)
Neutrophils Relative %: 77 %
Platelets: 292 10*3/uL (ref 150–400)
RBC: 4.09 MIL/uL (ref 3.87–5.11)
RDW: 13.2 % (ref 11.5–15.5)
WBC: 13.8 10*3/uL — ABNORMAL HIGH (ref 4.0–10.5)

## 2015-08-03 LAB — URINE MICROSCOPIC-ADD ON: Squamous Epithelial / LPF: NONE SEEN

## 2015-08-03 LAB — URINALYSIS, ROUTINE W REFLEX MICROSCOPIC
Bilirubin Urine: NEGATIVE
Glucose, UA: NEGATIVE mg/dL
Ketones, ur: NEGATIVE mg/dL
NITRITE: POSITIVE — AB
PH: 7.5 (ref 5.0–8.0)
Protein, ur: 30 mg/dL — AB
SPECIFIC GRAVITY, URINE: 1.011 (ref 1.005–1.030)

## 2015-08-03 MED ORDER — ONDANSETRON HCL 4 MG/2ML IJ SOLN: 4.0000 mg | Freq: Four times a day (QID) | INTRAMUSCULAR | Status: DC | PRN

## 2015-08-03 MED ORDER — CIPROFLOXACIN IN D5W 400 MG/200ML IV SOLN
400.0000 mg | Freq: Two times a day (BID) | INTRAVENOUS | Status: DC
  Administered 2015-08-03 – 2015-08-05 (×4): 400 mg via INTRAVENOUS
  Filled 2015-08-03 (×5): qty 200

## 2015-08-03 MED ORDER — CLONAZEPAM 1 MG PO TABS: 2.0000 mg | ORAL_TABLET | Freq: Every day | ORAL | Status: DC

## 2015-08-03 MED ORDER — TRAZODONE HCL 50 MG PO TABS
25.0000 mg | ORAL_TABLET | Freq: Every evening | ORAL | Status: DC | PRN
  Administered 2015-08-03: 25 mg via ORAL
  Filled 2015-08-03: qty 1

## 2015-08-03 MED ORDER — ACETAMINOPHEN 325 MG PO TABS
650.0000 mg | ORAL_TABLET | Freq: Four times a day (QID) | ORAL | Status: DC | PRN
  Administered 2015-08-03 – 2015-08-04 (×2): 650 mg via ORAL
  Filled 2015-08-03 (×2): qty 2

## 2015-08-03 MED ORDER — SODIUM CHLORIDE 0.9 % IV SOLN
INTRAVENOUS | Status: DC
  Administered 2015-08-03 – 2015-08-05 (×4): via INTRAVENOUS

## 2015-08-03 MED ORDER — MORPHINE SULFATE (PF) 4 MG/ML IV SOLN
4.0000 mg | Freq: Once | INTRAVENOUS | Status: AC
  Administered 2015-08-03: 4 mg via INTRAVENOUS
  Filled 2015-08-03: qty 1

## 2015-08-03 MED ORDER — FENTANYL CITRATE (PF) 100 MCG/2ML IJ SOLN
50.0000 ug | Freq: Once | INTRAMUSCULAR | Status: AC
  Administered 2015-08-03: 50 ug via INTRAVENOUS
  Filled 2015-08-03: qty 2

## 2015-08-03 MED ORDER — CIPROFLOXACIN IN D5W 400 MG/200ML IV SOLN
400.0000 mg | Freq: Once | INTRAVENOUS | Status: AC
  Administered 2015-08-03: 400 mg via INTRAVENOUS
  Filled 2015-08-03: qty 200

## 2015-08-03 MED ORDER — BISACODYL 10 MG RE SUPP: 10.0000 mg | Freq: Every day | RECTAL | Status: DC | PRN

## 2015-08-03 MED ORDER — ENOXAPARIN SODIUM 40 MG/0.4ML ~~LOC~~ SOLN
40.0000 mg | SUBCUTANEOUS | Status: DC
  Administered 2015-08-03 – 2015-08-04 (×2): 40 mg via SUBCUTANEOUS
  Filled 2015-08-03 (×2): qty 0.4

## 2015-08-03 MED ORDER — ONDANSETRON HCL 4 MG PO TABS: 4.0000 mg | ORAL_TABLET | Freq: Four times a day (QID) | ORAL | Status: DC | PRN

## 2015-08-03 MED ORDER — ACETAMINOPHEN 650 MG RE SUPP: 650.0000 mg | Freq: Four times a day (QID) | RECTAL | Status: DC | PRN

## 2015-08-03 MED ORDER — OLANZAPINE-FLUOXETINE HCL 12-50 MG PO CAPS: 1.0000 | ORAL_CAPSULE | Freq: Every day | ORAL | Status: DC

## 2015-08-03 MED ORDER — OLANZAPINE-FLUOXETINE HCL 12-50 MG PO CAPS
1.0000 | ORAL_CAPSULE | Freq: Every day | ORAL | Status: DC
  Administered 2015-08-03 – 2015-08-04 (×2): 1 via ORAL
  Filled 2015-08-03 (×2): qty 1

## 2015-08-03 MED ORDER — HYDROMORPHONE HCL 1 MG/ML IJ SOLN
1.0000 mg | INTRAMUSCULAR | Status: DC | PRN
  Administered 2015-08-03 – 2015-08-05 (×10): 1 mg via INTRAVENOUS
  Filled 2015-08-03 (×10): qty 1

## 2015-08-03 MED ORDER — DIATRIZOATE MEGLUMINE & SODIUM 66-10 % PO SOLN
90.0000 mL | Freq: Once | ORAL | Status: DC
  Filled 2015-08-03: qty 90

## 2015-08-03 MED ORDER — LEVOTHYROXINE SODIUM 100 MCG IV SOLR
68.5000 ug | Freq: Every day | INTRAVENOUS | Status: DC
  Administered 2015-08-04 – 2015-08-05 (×2): 68.5 ug via INTRAVENOUS
  Filled 2015-08-03 (×2): qty 5

## 2015-08-03 MED ORDER — SENNOSIDES-DOCUSATE SODIUM 8.6-50 MG PO TABS: 1.0000 | ORAL_TABLET | Freq: Every evening | ORAL | Status: DC | PRN

## 2015-08-03 MED ORDER — AMPHETAMINE-DEXTROAMPHETAMINE 10 MG PO TABS
30.0000 mg | ORAL_TABLET | Freq: Every day | ORAL | Status: DC
  Administered 2015-08-04 – 2015-08-05 (×2): 30 mg via ORAL
  Filled 2015-08-03 (×2): qty 3

## 2015-08-03 MED ORDER — IOPAMIDOL (ISOVUE-300) INJECTION 61%
INTRAVENOUS | Status: AC
  Administered 2015-08-03: 100 mL
  Filled 2015-08-03: qty 100

## 2015-08-03 MED ORDER — LORAZEPAM 2 MG/ML IJ SOLN: 0.5000 mg | Freq: Four times a day (QID) | INTRAMUSCULAR | Status: DC | PRN

## 2015-08-03 MED ORDER — HYDROMORPHONE HCL 1 MG/ML IJ SOLN
1.0000 mg | Freq: Once | INTRAMUSCULAR | Status: AC
  Administered 2015-08-03: 1 mg via INTRAVENOUS
  Filled 2015-08-03: qty 1

## 2015-08-03 MED ORDER — MAGNESIUM CITRATE PO SOLN
1.0000 | Freq: Once | ORAL | Status: AC | PRN
  Administered 2015-08-04: 1 via ORAL
  Filled 2015-08-03: qty 296

## 2015-08-03 NOTE — Progress Notes (Signed)
MEDICATION RELATED CONSULT NOTE - INITIAL   Pharmacy Consult for consult to transform po meds to IV  Indication:  as patient is NPO and has NGT due to SBO.   Allergies  Allergen Reactions  . Keflex [Cephalexin]   . Penicillins Hives    Has patient had a PCN reaction causing immediate rash, facial/tongue/throat swelling, SOB or lightheadedness with hypotension: Yes Has patient had a PCN reaction causing severe rash involving mucus membranes or skin necrosis: No Has patient had a PCN reaction that required hospitalization No Has patient had a PCN reaction occurring within the last 10 years: No If all of the above answers are "NO", then may proceed with Cephalosporin use.   . Sulfa Antibiotics     Patient Measurements: Height: 5\' 6"  (167.6 cm) Weight: 229 lb 8 oz (104.1 kg) IBW/kg (Calculated) : 59.3   Vital Signs: Temp: 98.5 F (36.9 C) (05/31 1550) Temp Source: Oral (05/31 1550) BP: 118/64 mmHg (05/31 1550) Pulse Rate: 100 (05/31 1550) Intake/Output from previous day:   Intake/Output from this shift:    Labs:  Recent Labs  08/03/15 0930  WBC 13.8*  HGB 11.3*  HCT 36.0  PLT 292  CREATININE 0.88  ALBUMIN 3.1*  PROT 6.8  AST 22  ALT 26  ALKPHOS 61  BILITOT 0.5   Estimated Creatinine Clearance: 108.7 mL/min (by C-G formula based on Cr of 0.88).   Microbiology: No results found for this or any previous visit (from the past 720 hour(s)).  Medical History: Past Medical History  Diagnosis Date  . Fibromyalgia   . Chronic interstitial cystitis     "removed my bladder when I went to end stage IC"  . Thyroid disease   . Hypothyroid   . Anxiety   . Depression   . Chronic headaches   . HLD (hyperlipidemia)   . ADHD (attention deficit hyperactivity disorder)   . History of blood transfusion 02/2010    "related to OR"  . Chronic back pain   . Bipolar disorder ([place])   . Self-catheterizes urinary bladder     "I have an [place] pouch; made a bladder using part  of my small intestines; cath out of stoma in my belly button" (08/02/2015)  . Small bowel obstruction ([place]) 08/03/2015    Medications:  Prescriptions prior to admission  Medication Sig Dispense Refill Last Dose  . amphetamine-dextroamphetamine (ADDERALL) 30 MG tablet Take 30 mg by mouth daily.   08/03/2015 at Unknown time  . Buprenorphine HCl (BELBUCA) 300 MCG FILM Place 300 mcg inside cheek 2 (two) times daily.   08/03/2015 at Unknown time  . cetirizine (ZYRTEC) 10 MG tablet Take 10 mg by mouth daily.   08/03/2015 at Unknown time  . clonazePAM (KLONOPIN) 1 MG tablet Take 2 tablets by mouth at bedtime.   08/02/2015 at Unknown time  . fenofibrate (TRICOR) 48 MG tablet Take 48 mg by mouth daily.   08/03/2015 at Unknown time  . ibuprofen (ADVIL,MOTRIN) 200 MG tablet Take 400 mg by mouth every 6 (six) hours as needed for mild pain.   08/02/2015 at Unknown time  . levothyroxine (SYNTHROID, LEVOTHROID) 137 MCG tablet Take 137 mcg by mouth daily.   08/03/2015 at Unknown time  . Multiple Vitamin (MULTIVITAMIN) capsule Take 1 capsule by mouth daily.   08/03/2015 at Unknown time  . nitrofurantoin (MACRODANTIN) 50 MG capsule Take 50 mg by mouth at bedtime.   08/02/2015 at Unknown time  . OLANZapine-FLUoxetine (SYMBYAX) 12-50 MG capsule Take 1 capsule  by mouth at bedtime.   08/02/2015 at Unknown time  . spironolactone (ALDACTONE) 100 MG tablet Take 100 mg by mouth at bedtime.   08/02/2015 at Unknown time  . tiZANidine (ZANAFLEX) 4 MG capsule Take 4 mg by mouth at bedtime.   08/02/2015 at Unknown time  . topiramate (TOPAMAX) 100 MG tablet Take 100 mg by mouth 2 (two) times daily.   08/03/2015 at Unknown time  . traZODone (DESYREL) 50 MG tablet Take 150 mg by mouth at bedtime.   08/02/2015 at Unknown time  . Vitamin D, Ergocalciferol, (DRISDOL) 50000 units CAPS capsule Take 50,000 Units by mouth 2 (two) times a week. Sunday and Wednesday   07/31/2015   Scheduled:  . [START ON 08/04/2015] amphetamine-dextroamphetamine  30 mg  Oral Daily  . diatrizoate meglumine-sodium  90 mL Per NG tube Once  . enoxaparin (LOVENOX) injection  40 mg Subcutaneous Q24H  . [START ON 08/04/2015] levothyroxine  68.5 mcg Intravenous QAC breakfast  . OLANZapine-FLUoxetine  1 capsule Oral QHS    Assessment:  35yo female with SBO.  Pharmacy consulted for  need to transform po to IV meds as patient is NPO and has NGT due to SBO.  The PA, Sharene Butters has already appropriately ordered medications IV that are available as IV formulation.  PTA po levothyroxine 137 mcg po daily has been changed to IV 1/2 the oral dose.  PA noted plan to convert oral Klonopin to IV ativan for insomnia/ anxiety (done) and plans to  change Zanaflex to IV Robaxin for fibromyalgia (not ordered yet).   Adderall, olanzapine-fluoxetine and trazodone are not available IV.   Plan:  Medications that can be given as IV have already been ordered appropriately as IV.  Other PO meds not available as IV.  Pharmacy will sign off.  Nicole Cella, RPh Clinical Pharmacist Pager: [phone] 08/03/2015,5:45 PM

## 2015-08-03 NOTE — ED Notes (Signed)
Admitting at bedside 

## 2015-08-03 NOTE — ED Notes (Signed)
MD Wilson at bedside.

## 2015-08-03 NOTE — ED Notes (Signed)
Attempted report 

## 2015-08-03 NOTE — H&P (Signed)
History and Physical    [name] [id number] DOB: Dec 09, 1980 DOA: 08/03/2015   PCP: Tivis Ringer, MD   Patient coming from:  Home  Chief Complaint: Lower abdominal pain   HPI: [name] is a 35 y.o. female with medical history significant for fibromyalgia, Hypothyroidism, depressiion, ADHD, HLDchronic interstitial cystitis s/p cystectomy and [place] pouch, presenting with abdominal pain. The symptoms began on Saturday evening, after arriving to [place]. During that time, she had been avoiding urinating in view of her long-distance trip. Usually she urinates via cath 3x daily, but she did so after 12 hrs. On her right bowel they are, she felt constant stabbing to her suprapubic area, with some diffuse abdominal pain. He had some lightheadedness, without nausea or vomiting, and she was a stable at the time. Thus, she did not seek medical attention, concerned about insurance issues. She returned yesterday from her trip, at which time, her pain was worse, 10 out of 10, with intermittent fever and chills, up to 100 F. She also has intermittent hematuria and decreased UO  since Saturday. She took that revealed for pain without relief.the patient feels weak, has not been able to take by mouth's due to these symptoms He will be admitted for observation and management of her symptoms.   ED Course:  BP 118/64 mmHg  Pulse 100  Temp(Src) 98.5 F (36.9 C) (Oral)  Resp 16  Ht 5\' 6"  (1.676 m)  Wt 104.1 kg (229 lb 8 oz)  BMI 37.06 kg/m2  SpO2 96% WBC 13.8. UA +nitrite and large WBC.    CT A/P Postsurgical changes compatible previous cystectomy with ureteral diversion and neobladder within the right lower quadrant. There are several dilated small bowel loops leading into the region of the neobladder in the right lower quadrant with mild adjacent inflammatory change and minimal associated free fluid. Findings likely represent early/ partial small bowel obstruction likely due to  adhesions. 1.5 x 1.9 cm oval hypodensity over the anterior pelvis just below the dilated small bowel loops. Cannot exclude a small abscess. Recommend attention on follow-up. Horseshoe kidney. She received MSO4  4 mg without significant control of her symptoms, now changed to Dilaudid IV.   Review of Systems: As per HPI otherwise 10 point review of systems negative.   Past Medical History  Diagnosis Date  . Fibromyalgia   . Cystitis   . Thyroid disease   . Hypothyroid   . Anxiety   . Depression   . Chronic headaches   . HLD (hyperlipidemia)   . ADHD (attention deficit hyperactivity disorder)     Past Surgical History  Procedure Laterality Date  . Appendectomy    . Bladder removal  2011  . Abdominal hysterectomy    . Spinal cord stimulator implant       reports that she has never smoked. She does not have any smokeless tobacco history on file. She reports that she does not drink alcohol or use illicit drugs. Walks unassisted  with cane  with walker Patient is on  wheelchair  Allergies  Allergen Reactions  . Keflex [Cephalexin]   . Penicillins Hives    Has patient had a PCN reaction causing immediate rash, facial/tongue/throat swelling, SOB or lightheadedness with hypotension: Yes Has patient had a PCN reaction causing severe rash involving mucus membranes or skin necrosis: No Has patient had a PCN reaction that required hospitalization No Has patient had a PCN reaction occurring within the last 10 years: No If all of the above  answers are "NO", then may proceed with Cephalosporin use.   . Sulfa Antibiotics     No family history on file.    Prior to Admission medications   Medication Sig Start Date End Date Taking? Authorizing Provider  amphetamine-dextroamphetamine (ADDERALL) 30 MG tablet Take 30 mg by mouth daily. 08/20/14  Yes Historical Provider, MD  Buprenorphine HCl (BELBUCA) 300 MCG FILM Place 300 mcg inside cheek 2 (two) times daily.   Yes Historical Provider, MD    cetirizine (ZYRTEC) 10 MG tablet Take 10 mg by mouth daily.   Yes Historical Provider, MD  clonazePAM (KLONOPIN) 1 MG tablet Take 2 tablets by mouth at bedtime. 08/20/14  Yes Historical Provider, MD  fenofibrate (TRICOR) 48 MG tablet Take 48 mg by mouth daily.   Yes Historical Provider, MD  ibuprofen (ADVIL,MOTRIN) 200 MG tablet Take 400 mg by mouth every 6 (six) hours as needed for mild pain.   Yes Historical Provider, MD  levothyroxine (SYNTHROID, LEVOTHROID) 137 MCG tablet Take 137 mcg by mouth daily. 12/07/14  Yes Historical Provider, MD  Multiple Vitamin (MULTIVITAMIN) capsule Take 1 capsule by mouth daily.   Yes Historical Provider, MD  nitrofurantoin (MACRODANTIN) 50 MG capsule Take 50 mg by mouth at bedtime. 09/01/14  Yes Historical Provider, MD  OLANZapine-FLUoxetine (SYMBYAX) 12-50 MG capsule Take 1 capsule by mouth at bedtime. 08/30/14  Yes Historical Provider, MD  spironolactone (ALDACTONE) 100 MG tablet Take 100 mg by mouth at bedtime. 08/30/14  Yes Historical Provider, MD  tiZANidine (ZANAFLEX) 4 MG capsule Take 4 mg by mouth at bedtime. 06/30/15  Yes Historical Provider, MD  topiramate (TOPAMAX) 100 MG tablet Take 100 mg by mouth 2 (two) times daily. 05/19/15  Yes Historical Provider, MD  traZODone (DESYREL) 50 MG tablet Take 150 mg by mouth at bedtime. 08/30/14  Yes Historical Provider, MD  Vitamin D, Ergocalciferol, (DRISDOL) 50000 units CAPS capsule Take 50,000 Units by mouth 2 (two) times a week. Sunday and Wednesday 08/18/14  Yes Historical Provider, MD    Physical Exam:    Filed Vitals:   08/03/15 1430 08/03/15 1445 08/03/15 1500 08/03/15 1550  BP: 116/81   118/64  Pulse:  100  100  Temp:    98.5 F (36.9 C)  TempSrc:    Oral  Resp:      Height:   5\' 6"  (1.676 m)   Weight:   104.1 kg (229 lb 8 oz)   SpO2: 100%   96%      Constitutional: uncomfortable, tearful due to the pain  Filed Vitals:   08/03/15 1430 08/03/15 1445 08/03/15 1500 08/03/15 1550  BP: 116/81    118/64  Pulse:  100  100  Temp:    98.5 F (36.9 C)  TempSrc:    Oral  Resp:      Height:   5\' 6"  (1.676 m)   Weight:   104.1 kg (229 lb 8 oz)   SpO2: 100%   96%   Eyes: PERRL, lids and conjunctivae normal ENMT: Mucous membranes are moist. Posterior pharynx clear of any exudate or lesions.Normal dentition.  Neck: normal, supple, no masses, no thyromegaly Respiratory: clear to auscultation bilaterally, no wheezing, no crackles. Normal respiratory effort. No accessory muscle use.  Cardiovascular: Regular rate and rhythm, no murmurs / rubs / gallops. No extremity edema. 2+ pedal pulses. No carotid bruits.  Abdomen: diffuse, exquisite  Tenderness with minimal palpation, some urine noted at the umbilicus, non odorous , no masses palpated. No hepatosplenomegaly. Bowel sounds  positive.  Musculoskeletal: no clubbing / cyanosis. No joint deformity upper and lower extremities. Good ROM, no contractures. Normal muscle tone.  Skin: no rashes, lesions, ulcers. No induration Neurologic: CN 2-12 grossly intact. Sensation intact, DTR normal. Strength 5/5 in all 4.  Psychiatric: Normal judgment and insight. Alert and oriented x 3. Tearful.     Labs on Admission: I have personally reviewed following labs and imaging studies  CBC:  Recent Labs Lab 08/03/15 0930  WBC 13.8*  NEUTROABS 10.6*  HGB 11.3*  HCT 36.0  MCV 88.0  PLT 123456    Basic Metabolic Panel:  Recent Labs Lab 08/03/15 0930  NA 134*  K 3.8  CL 108  CO2 19*  GLUCOSE 122*  BUN 9  CREATININE 0.88  CALCIUM 8.6*    GFR: Estimated Creatinine Clearance: 108.7 mL/min (by C-G formula based on Cr of 0.88).  Liver Function Tests:  Recent Labs Lab 08/03/15 0930  AST 22  ALT 26  ALKPHOS 61  BILITOT 0.5  PROT 6.8  ALBUMIN 3.1*    Recent Labs Lab 08/03/15 0930  LIPASE 18   No results for input(s): AMMONIA in the last 168 hours.  Coagulation Profile: No results for input(s): INR, PROTIME in the last 168  hours.  Cardiac Enzymes: No results for input(s): CKTOTAL, CKMB, CKMBINDEX, TROPONINI in the last 168 hours.  BNP (last 3 results) No results for input(s): PROBNP in the last 8760 hours.  HbA1C: No results for input(s): HGBA1C in the last 72 hours.  CBG: No results for input(s): GLUCAP in the last 168 hours.  Lipid Profile: No results for input(s): CHOL, HDL, LDLCALC, TRIG, CHOLHDL, LDLDIRECT in the last 72 hours.  Thyroid Function Tests: No results for input(s): TSH, T4TOTAL, FREET4, T3FREE, THYROIDAB in the last 72 hours.  Anemia Panel: No results for input(s): VITAMINB12, FOLATE, FERRITIN, TIBC, IRON, RETICCTPCT in the last 72 hours.  Urine analysis:    Component Value Date/Time   COLORURINE YELLOW 08/03/2015 1038   APPEARANCEUR CLOUDY* 08/03/2015 1038   LABSPEC 1.011 08/03/2015 1038   PHURINE 7.5 08/03/2015 1038   GLUCOSEU NEGATIVE 08/03/2015 1038   HGBUR TRACE* 08/03/2015 1038   [place] 08/03/2015 [place] 08/03/2015 1038   PROTEINUR 30* 08/03/2015 1038   UROBILINOGEN 0.2 02/17/2009 1445   NITRITE POSITIVE* 08/03/2015 1038   LEUKOCYTESUR LARGE* 08/03/2015 1038    Sepsis Labs: @LABRCNTIP (procalcitonin:4,lacticidven:4) )No results found for this or any previous visit (from the past 240 hour(s)).   Radiological Exams on Admission: Ct Abdomen Pelvis W Contrast  08/03/2015  CLINICAL DATA:  Lower abdominal pain five days. History of previous cystectomy with ileal diversion and hysterectomy. EXAM: CT ABDOMEN AND PELVIS WITH CONTRAST TECHNIQUE: Multidetector CT imaging of the abdomen and pelvis was performed using the standard protocol following bolus administration of intravenous contrast. CONTRAST:  172mL ISOVUE-300 IOPAMIDOL (ISOVUE-300) INJECTION 61% COMPARISON:  02/16/2014 and 12/22/2010 FINDINGS: Lung bases demonstrate mild right basilar atelectasis. Tiny amount of right pleural fluid. Abdominal images demonstrate a normal liver, spleen,  pancreas, gallbladder and adrenal glands. Horseshoe kidney is unchanged without hydronephrosis or nephrolithiasis. There are postsurgical changes compatible patient's prior cystectomy with ureteral diversion and creation of neobladder over the right lower quadrant. There are few mildly dilated small bowel loops just proximal to the neobladder in the right lower quadrant as this may be early/partial obstruction. There is mild stranding of the adjacent fat and minimal fluid in the right pericolic gutter. There is a 1.5 x 1.9 cm  rounded hypodensity over the anterior pelvis just below the dilated small bowel loops of uncertain clinical significance, although cannot exclude a small abscess. There is a surgical suture line over the colon in the right lower quadrant. Remaining pelvic images demonstrate a small amount free fluid as well as several surgical clips. Rectosigmoid colon is within normal. Neural stimulator device is present over the left gluteal region with leads extending into the mid thoracic canal at the T8 level. Minimal degenerative change of the hips. IMPRESSION: Postsurgical changes compatible previous cystectomy with ureteral diversion and neobladder within the right lower quadrant. There are several dilated small bowel loops leading into the region of the neobladder in the right lower quadrant with mild adjacent inflammatory change and minimal associated free fluid. Findings likely represent early/ partial small bowel obstruction likely due to adhesions. 1.5 x 1.9 cm oval hypodensity over the anterior pelvis just below the dilated small bowel loops. Cannot exclude a small abscess. Recommend attention on follow-up. Horseshoe kidney. Electronically Signed   By: Marin Olp M.D.   On: 08/03/2015 11:27    EKG: Independently reviewed.  Assessment/Plan Principal Problem:   SBO (small bowel obstruction) (HCC) Active Problems:   Hypothyroidism   ADHD (attention deficit hyperactivity disorder)    Depression   Fibromyalgia   Acute, diffuse pain with SBO in a patient with interstitial cystitis and s/p neobladder in 2011, and with +UA suspicious for UTI (+ Nitrites and WBC)  CT abdomen and pelvis possible early/ partial small bowel obstruction likely due to adhesions. 1.5 x 1.9 cm oval hypodensity suspicious small abscess. WBC 13.8  Afebrile. Cultures pending Admit for med surg obs. SBO protocol Currently patient able to manage oral secretions. No NGT is indicated unless clinical status worsens; at that  cnsider transfer to [facility] as discussed with Surgery Patient urological care has been at that facility. This was discussed with [facility]  bowel rest , IVF If symptoms not improving in am, will consult surgery  Pain control with IV Dilaudid  Abx with Cipro. If condition worsens consult IR for abscess drainage.   Hypothyroidism:  -Continue home Synthroid as IV  Hirsutism Will hold spironolactone (used for hair growth)  at this time  ADHD Continue Adderal   Depression Will continue meds after discussion with Pharmacy  Insomnia/ Anxiety Will convert oral Klonopin  to IV ativan    Fibromyalgia Will change Zanaflex to IV Robaxin   Deconditioning Consult PT/OT/Nutrition  DVT prophylaxis: Lovenox  Code Status:   Full     Family Communication:  Discussed with  husband  Disposition Plan: Expect patient to be discharged to home after condition improves Consults called:    None Admission status: Obs Medical floor    [facility] E, PA-C [facility]   If 7PM-7AM, please contact night-coverage [email or web address] Password [email or web address]  08/03/2015, 4:00 PM

## 2015-08-03 NOTE — ED Notes (Signed)
Pt presents via POV c/o lower abdominal pain since Saturday.  Pt has artificial bladder-[place] Pouch.  Reports long trips in the car this weekend without emptying her bladder, also possible stones?  Pt a x 4, NAD.  Reports abdomen also feels swollen.  Denies N/V, reports fever and chills.

## 2015-08-03 NOTE — Consult Note (Signed)
Reason for Consult:PSBO, questionable intra-abdominal abscess Referring Physician: Dr. Tyron Russell   HPI: [name] is a 35 year old female with interstitial cystitis s/p cystectomy, hysterectomy, appendectomy and creation of [place] Pouch in 2011 by Dr. Amalia Hailey at [facility], chronic pain s/p spinal cord stimulator, chronic narcotic and followed [place] at [facility].  She reports on Saturday she drove back from [place] and developed abdominal pain which is different from her usual chronic pain.  It was lower pelvic region which radiated throughout.  Symptoms were constant.  Denies fever, chills or sweats.  Denies flank pain.  Denies constipation, had a BM yesterday and is passing flatus.  She has intermittent hematuria and takes chronic antibiotics since her surgery in 2011.  She has also had a c section prior to surgery.  Denies a history of bowel obstructions.  ED work up reveals, WBC 13.8k.  Normal renal function, hepatic function, UA significant for leukocytes and nitrites.  CT of abdomen and pelvis significant for PSBO, also notes a hypodensity anterior pelvis 1.5x1.9cm, cannot rule out an abscess.  We have therefore been asked to evaluate.   Past Medical History  Diagnosis Date  . Fibromyalgia   . Cystitis   . Thyroid disease     Past Surgical History  Procedure Laterality Date  . Appendectomy    . Bladder removal  2011  . Abdominal hysterectomy    . Spinal cord stimulator implant      No family history on file.  Social History:  reports that she has never smoked. She does not have any smokeless tobacco history on file. She reports that she does not drink alcohol or use illicit drugs.  Allergies:  Allergies  Allergen Reactions  . Keflex [Cephalexin]   . Penicillins Hives    Has patient had a PCN reaction causing immediate rash, facial/tongue/throat swelling, SOB or lightheadedness with hypotension: Yes Has patient had a PCN reaction causing severe rash  involving mucus membranes or skin necrosis: No Has patient had a PCN reaction that required hospitalization No Has patient had a PCN reaction occurring within the last 10 years: No If all of the above answers are "NO", then may proceed with Cephalosporin use.   . Sulfa Antibiotics     Medications:  Scheduled Meds: .  HYDROmorphone (DILAUDID) injection  1 mg Intravenous Once   Continuous Infusions: . ciprofloxacin     PRN Meds:.   Results for orders placed or performed during the hospital encounter of 08/03/15 (from the past 48 hour(s))  Lipase, blood     Status: None   Collection Time: 08/03/15  9:30 AM  Result Value Ref Range   Lipase 18 11 - 51 U/L  Comprehensive metabolic panel     Status: Abnormal   Collection Time: 08/03/15  9:30 AM  Result Value Ref Range   Sodium 134 (L) 135 - 145 mmol/L   Potassium 3.8 3.5 - 5.1 mmol/L   Chloride 108 101 - 111 mmol/L   CO2 19 (L) 22 - 32 mmol/L   Glucose, Bld 122 (H) 65 - 99 mg/dL   BUN 9 6 - 20 mg/dL   Creatinine, Ser 0.88 0.44 - 1.00 mg/dL   Calcium 8.6 (L) 8.9 - 10.3 mg/dL   Total Protein 6.8 6.5 - 8.1 g/dL   Albumin 3.1 (L) 3.5 - 5.0 g/dL   AST 22 15 - 41 U/L   ALT 26 14 - 54 U/L   Alkaline Phosphatase 61 38 - 126 U/L  Total Bilirubin 0.5 0.3 - 1.2 mg/dL   GFR calc non Af Amer >60 >60 mL/min   GFR calc Af Amer >60 >60 mL/min    Comment: (NOTE) The eGFR has been calculated using the CKD EPI equation. This calculation has not been validated in all clinical situations. eGFR's persistently <60 mL/min signify possible Chronic Kidney Disease.    Anion gap 7 5 - 15  CBC with Differential     Status: Abnormal   Collection Time: 08/03/15  9:30 AM  Result Value Ref Range   WBC 13.8 (H) 4.0 - 10.5 K/uL   RBC 4.09 3.87 - 5.11 MIL/uL   Hemoglobin 11.3 (L) 12.0 - 15.0 g/dL   HCT 36.0 36.0 - 46.0 %   MCV 88.0 78.0 - 100.0 fL   MCH 27.6 26.0 - 34.0 pg   MCHC 31.4 30.0 - 36.0 g/dL   RDW 13.2 11.5 - 15.5 %   Platelets 292 150 -  400 K/uL   Neutrophils Relative % 77 %   Neutro Abs 10.6 (H) 1.7 - 7.7 K/uL   Lymphocytes Relative 13 %   Lymphs Abs 1.8 0.7 - 4.0 K/uL   Monocytes Relative 9 %   Monocytes Absolute 1.3 (H) 0.1 - 1.0 K/uL   Eosinophils Relative 1 %   Eosinophils Absolute 0.1 0.0 - 0.7 K/uL   Basophils Relative 0 %   Basophils Absolute 0.0 0.0 - 0.1 K/uL  Urinalysis, Routine w reflex microscopic     Status: Abnormal   Collection Time: 08/03/15 10:38 AM  Result Value Ref Range   Color, Urine YELLOW YELLOW   APPearance CLOUDY (A) CLEAR   Specific Gravity, Urine 1.011 1.005 - 1.030   pH 7.5 5.0 - 8.0   Glucose, UA NEGATIVE NEGATIVE mg/dL   Hgb urine dipstick TRACE (A) NEGATIVE   Bilirubin Urine NEGATIVE NEGATIVE   Ketones, ur NEGATIVE NEGATIVE mg/dL   Protein, ur 30 (A) NEGATIVE mg/dL   Nitrite POSITIVE (A) NEGATIVE   Leukocytes, UA LARGE (A) NEGATIVE  Urine microscopic-add on     Status: Abnormal   Collection Time: 08/03/15 10:38 AM  Result Value Ref Range   Squamous Epithelial / LPF NONE SEEN NONE SEEN   WBC, UA 6-30 0 - 5 WBC/hpf   RBC / HPF 0-5 0 - 5 RBC/hpf   Bacteria, UA FEW (A) NONE SEEN   Urine-Other AMORPHOUS URATES/PHOSPHATES     Ct Abdomen Pelvis W Contrast  08/03/2015  CLINICAL DATA:  Lower abdominal pain five days. History of previous cystectomy with ileal diversion and hysterectomy. EXAM: CT ABDOMEN AND PELVIS WITH CONTRAST TECHNIQUE: Multidetector CT imaging of the abdomen and pelvis was performed using the standard protocol following bolus administration of intravenous contrast. CONTRAST:  128m ISOVUE-300 IOPAMIDOL (ISOVUE-300) INJECTION 61% COMPARISON:  02/16/2014 and 12/22/2010 FINDINGS: Lung bases demonstrate mild right basilar atelectasis. Tiny amount of right pleural fluid. Abdominal images demonstrate a normal liver, spleen, pancreas, gallbladder and adrenal glands. Horseshoe kidney is unchanged without hydronephrosis or nephrolithiasis. There are postsurgical changes compatible  patient's prior cystectomy with ureteral diversion and creation of neobladder over the right lower quadrant. There are few mildly dilated small bowel loops just proximal to the neobladder in the right lower quadrant as this may be early/partial obstruction. There is mild stranding of the adjacent fat and minimal fluid in the right pericolic gutter. There is a 1.5 x 1.9 cm rounded hypodensity over the anterior pelvis just below the dilated small bowel loops of uncertain clinical significance, although  cannot exclude a small abscess. There is a surgical suture line over the colon in the right lower quadrant. Remaining pelvic images demonstrate a small amount free fluid as well as several surgical clips. Rectosigmoid colon is within normal. Neural stimulator device is present over the left gluteal region with leads extending into the mid thoracic canal at the T8 level. Minimal degenerative change of the hips. IMPRESSION: Postsurgical changes compatible previous cystectomy with ureteral diversion and neobladder within the right lower quadrant. There are several dilated small bowel loops leading into the region of the neobladder in the right lower quadrant with mild adjacent inflammatory change and minimal associated free fluid. Findings likely represent early/ partial small bowel obstruction likely due to adhesions. 1.5 x 1.9 cm oval hypodensity over the anterior pelvis just below the dilated small bowel loops. Cannot exclude a small abscess. Recommend attention on follow-up. Horseshoe kidney. Electronically Signed   By: Marin Olp M.D.   On: 08/03/2015 11:27    Review of Systems  Constitutional: Negative for fever, chills, malaise/fatigue and diaphoresis.  Respiratory: Negative for cough, hemoptysis, sputum production, shortness of breath and wheezing.   Cardiovascular: Negative for chest pain, palpitations, orthopnea, claudication, leg swelling and PND.  Gastrointestinal: Positive for abdominal pain.  Negative for nausea, vomiting, diarrhea, constipation, blood in stool and melena.  Genitourinary: Negative for hematuria and flank pain.  Neurological: Negative for dizziness, tingling, tremors, sensory change, speech change, focal weakness, seizures, loss of consciousness and weakness.   Blood pressure 111/70, pulse 87, temperature 98 F (36.7 C), temperature source Oral, resp. rate 16, SpO2 97 %. Physical Exam  Constitutional: She is oriented to person, place, and time. She appears well-developed and well-nourished. No distress.  Cardiovascular: Normal rate, normal heart sounds and intact distal pulses.  Exam reveals no gallop.   No murmur heard. Respiratory: Effort normal and breath sounds normal. No respiratory distress. She has no wheezes. She has no rales. She exhibits no tenderness.  GI: Soft. Bowel sounds are normal.  Diffuse tenderness with voluntary guarding, most appreciated suprapubic region.   Musculoskeletal: She exhibits no edema or tenderness.  Neurological: She is alert and oriented to person, place, and time.  Skin: Skin is warm and dry. No rash noted. She is not diaphoretic. No erythema. No pallor.  Psychiatric: She has a normal mood and affect. Her behavior is normal. Judgment and thought content normal.    Assessment/Plan: interstitial cystitis s/p cystectomy, hysterectomy, appendectomy and creation of [place] Pouch in 2011 by Dr. Amalia Hailey at [facility],  chronic pain s/p spinal cord stimulator, chronic narcotic and followed [place] at [facility].  PSBO-she is not vomiting, I think we can hold off on NGT placement until Dr. Redmond Pulling evaluates the patient.  Admit for IV hydration, bowel rest and pain control Questionable pelvic abscess-not quite sure whether its an abscess.  Was not seen on imaging from [facility] in 2014.  Will discuss with Dr. Redmond Pulling if antibiotics are indicated. UTI-send for culture    Buffey Zabinski ANP-BC 08/03/2015, 1:01 PM

## 2015-08-03 NOTE — Progress Notes (Signed)
Had a working theory that pt's neobladder may have become overdistended this weekend and had a small leak which would explain inflammation and fluid collection. The SB could be reactive ileus.   Discussed ct with Dr Lawerance Bach at [facility] who is on call dr Amalia Hailey this evening. Agreed with my assessment.  rec keeping neobladder decompressed with foley.  They would be happy to accept pt in transfer if felt needed.  Told him we would see how pt did overnight and talk with dr Amalia Hailey tomorrow and go from there.  Informed pt via phone Discussed with floor nurse  Leighton Ruff. Redmond Pulling, MD, FACS General, Bariatric, & Minimally Invasive Surgery [facility], [place]

## 2015-08-03 NOTE — ED Provider Notes (Signed)
CSN: [id number]     Arrival date & time 08/03/15  0820 History   First MD Initiated Contact with Patient 08/03/15 [phone]     Chief Complaint  Patient presents with  . Abdominal Pain     (Consider location/radiation/quality/duration/timing/severity/associated sxs/prior Treatment) HPI Comments: Patient is a 35 year old female with history of interstitial cystitis with cystectomy and [place] pouch who presents with abdominal pain. Patient reports her abdominal pain began Saturday evening while she was traveling. Patient describes her pain as a constant stabbing to her suprapubic area with some more mild abdominal pain throughout the rest of her abdomen. Patient states her pain is worse with walking. Patient has had some associated lightheadedness when she stands up. Patient also states she has had a tactile fever and chills, last occurring yesterday. Patient has been taking Advil for the pain without relief. Patient no longer menstruates, and denies vaginal discharge. Patient states she was in the car for 12 hours on Saturday began having pain Saturday evening. Patient states she normally empties her artificial bladder 3 times daily, however she did not empty it in 12 hours on Saturday. Patient states she is emptying a little less than normal now, but has not been drinking as much water. Patient has been seen some blood in her urine, which she does see occasionally over the past year. Patient also states she has baseline neuropathic abdominal pain, but this feels much worse. Patient sees Dr. Amalia Hailey at [facility] for following of her artificial bladder and interstitial cystitis. Patient denies chest pain, shortness of breath, nausea, vomiting.  Patient is a 35 y.o. female presenting with abdominal pain. The history is provided by the patient.  Abdominal Pain Associated symptoms: hematuria   Associated symptoms: no chest pain, no chills, no fever, no nausea, no shortness of breath, no sore throat and no  vomiting     Past Medical History  Diagnosis Date  . Fibromyalgia   . Cystitis   . Thyroid disease   . Hypothyroid   . Anxiety   . Depression   . Chronic headaches   . HLD (hyperlipidemia)   . ADHD (attention deficit hyperactivity disorder)    Past Surgical History  Procedure Laterality Date  . Appendectomy    . Bladder removal  2011  . Abdominal hysterectomy    . Spinal cord stimulator implant     No family history on file. Social History  Substance Use Topics  . Smoking status: Never Smoker   . Smokeless tobacco: None  . Alcohol Use: No   OB History    No data available     Review of Systems  Constitutional: Negative for fever, chills and appetite change.  HENT: Negative for facial swelling and sore throat.   Respiratory: Negative for shortness of breath.   Cardiovascular: Negative for chest pain.  Gastrointestinal: Positive for abdominal pain. Negative for nausea and vomiting.  Genitourinary: Positive for hematuria and decreased urine volume.  Musculoskeletal: Negative for back pain.  Skin: Negative for rash and wound.  Neurological: Negative for headaches.  Psychiatric/Behavioral: The patient is not nervous/anxious.       Allergies  Keflex; Penicillins; and Sulfa antibiotics  Home Medications   Prior to Admission medications   Medication Sig Start Date End Date Taking? Authorizing Provider  amphetamine-dextroamphetamine (ADDERALL) 30 MG tablet Take 30 mg by mouth daily. 08/20/14  Yes Historical Provider, MD  Buprenorphine HCl (BELBUCA) 300 MCG FILM Place 300 mcg inside cheek 2 (two) times daily.   Yes Historical  Provider, MD  cetirizine (ZYRTEC) 10 MG tablet Take 10 mg by mouth daily.   Yes Historical Provider, MD  clonazePAM (KLONOPIN) 1 MG tablet Take 2 tablets by mouth at bedtime. 08/20/14  Yes Historical Provider, MD  fenofibrate (TRICOR) 48 MG tablet Take 48 mg by mouth daily.   Yes Historical Provider, MD  ibuprofen (ADVIL,MOTRIN) 200 MG tablet Take  400 mg by mouth every 6 (six) hours as needed for mild pain.   Yes Historical Provider, MD  levothyroxine (SYNTHROID, LEVOTHROID) 137 MCG tablet Take 137 mcg by mouth daily. 12/07/14  Yes Historical Provider, MD  Multiple Vitamin (MULTIVITAMIN) capsule Take 1 capsule by mouth daily.   Yes Historical Provider, MD  nitrofurantoin (MACRODANTIN) 50 MG capsule Take 50 mg by mouth at bedtime. 09/01/14  Yes Historical Provider, MD  OLANZapine-FLUoxetine (SYMBYAX) 12-50 MG capsule Take 1 capsule by mouth at bedtime. 08/30/14  Yes Historical Provider, MD  spironolactone (ALDACTONE) 100 MG tablet Take 100 mg by mouth at bedtime. 08/30/14  Yes Historical Provider, MD  tiZANidine (ZANAFLEX) 4 MG capsule Take 4 mg by mouth at bedtime. 06/30/15  Yes Historical Provider, MD  topiramate (TOPAMAX) 100 MG tablet Take 100 mg by mouth 2 (two) times daily. 05/19/15  Yes Historical Provider, MD  traZODone (DESYREL) 50 MG tablet Take 150 mg by mouth at bedtime. 08/30/14  Yes Historical Provider, MD  Vitamin D, Ergocalciferol, (DRISDOL) 50000 units CAPS capsule Take 50,000 Units by mouth 2 (two) times a week. Sunday and Wednesday 08/18/14  Yes Historical Provider, MD   BP 114/76 mmHg  Pulse 93  Temp(Src) 98 F (36.7 C) (Oral)  Resp 16  SpO2 100% Physical Exam  Constitutional: She appears well-developed and well-nourished. No distress.  HENT:  Head: Normocephalic and atraumatic.  Mouth/Throat: Mucous membranes are dry. No oropharyngeal exudate.  Eyes: Conjunctivae are normal. Pupils are equal, round, and reactive to light. Right eye exhibits no discharge. Left eye exhibits no discharge. No scleral icterus.  Neck: Normal range of motion. Neck supple. No thyromegaly present.  Cardiovascular: Normal rate, regular rhythm, normal heart sounds and intact distal pulses.  Exam reveals no gallop and no friction rub.   No murmur heard. Pulmonary/Chest: Effort normal and breath sounds normal. No stridor. No respiratory distress. She  has no wheezes. She has no rales.  Abdominal: Soft. Bowel sounds are normal. She exhibits no distension. There is generalized tenderness. There is no rebound and no guarding.    Musculoskeletal: She exhibits no edema.  Lymphadenopathy:    She has no cervical adenopathy.  Neurological: She is alert. Coordination normal.  Skin: Skin is warm and dry. No rash noted. She is not diaphoretic. No pallor.  Psychiatric: She has a normal mood and affect.  Nursing note and vitals reviewed.   ED Course  Procedures (including critical care time) Labs Review Labs Reviewed  COMPREHENSIVE METABOLIC PANEL - Abnormal; Notable for the following:    Sodium 134 (*)    CO2 19 (*)    Glucose, Bld 122 (*)    Calcium 8.6 (*)    Albumin 3.1 (*)    All other components within normal limits  URINALYSIS, ROUTINE W REFLEX MICROSCOPIC (NOT AT [facility]) - Abnormal; Notable for the following:    APPearance CLOUDY (*)    Hgb urine dipstick TRACE (*)    Protein, ur 30 (*)    Nitrite POSITIVE (*)    Leukocytes, UA LARGE (*)    All other components within normal limits  CBC WITH  DIFFERENTIAL/PLATELET - Abnormal; Notable for the following:    WBC 13.8 (*)    Hemoglobin 11.3 (*)    Neutro Abs 10.6 (*)    Monocytes Absolute 1.3 (*)    All other components within normal limits  URINE MICROSCOPIC-ADD ON - Abnormal; Notable for the following:    Bacteria, UA FEW (*)    All other components within normal limits  URINE CULTURE  URINE CULTURE  LIPASE, BLOOD    Imaging Review Ct Abdomen Pelvis W Contrast  08/03/2015  CLINICAL DATA:  Lower abdominal pain five days. History of previous cystectomy with ileal diversion and hysterectomy. EXAM: CT ABDOMEN AND PELVIS WITH CONTRAST TECHNIQUE: Multidetector CT imaging of the abdomen and pelvis was performed using the standard protocol following bolus administration of intravenous contrast. CONTRAST:  185mL ISOVUE-300 IOPAMIDOL (ISOVUE-300) INJECTION 61% COMPARISON:  02/16/2014  and 12/22/2010 FINDINGS: Lung bases demonstrate mild right basilar atelectasis. Tiny amount of right pleural fluid. Abdominal images demonstrate a normal liver, spleen, pancreas, gallbladder and adrenal glands. Horseshoe kidney is unchanged without hydronephrosis or nephrolithiasis. There are postsurgical changes compatible patient's prior cystectomy with ureteral diversion and creation of neobladder over the right lower quadrant. There are few mildly dilated small bowel loops just proximal to the neobladder in the right lower quadrant as this may be early/partial obstruction. There is mild stranding of the adjacent fat and minimal fluid in the right pericolic gutter. There is a 1.5 x 1.9 cm rounded hypodensity over the anterior pelvis just below the dilated small bowel loops of uncertain clinical significance, although cannot exclude a small abscess. There is a surgical suture line over the colon in the right lower quadrant. Remaining pelvic images demonstrate a small amount free fluid as well as several surgical clips. Rectosigmoid colon is within normal. Neural stimulator device is present over the left gluteal region with leads extending into the mid thoracic canal at the T8 level. Minimal degenerative change of the hips. IMPRESSION: Postsurgical changes compatible previous cystectomy with ureteral diversion and neobladder within the right lower quadrant. There are several dilated small bowel loops leading into the region of the neobladder in the right lower quadrant with mild adjacent inflammatory change and minimal associated free fluid. Findings likely represent early/ partial small bowel obstruction likely due to adhesions. 1.5 x 1.9 cm oval hypodensity over the anterior pelvis just below the dilated small bowel loops. Cannot exclude a small abscess. Recommend attention on follow-up. Horseshoe kidney. Electronically Signed   By: Marin Olp M.D.   On: 08/03/2015 11:27   I have personally reviewed and  evaluated these images and lab results as part of my medical decision-making.   EKG Interpretation None      MDM   CBC shows WBC 13.8, hemoglobin 11.3. CMP shows sodium 134, CO2 19, glucose 122, calcium 8.6, albumin 3.1. Lipase 18. UA shows large leukocytes, positive nitrites, trace hematuria, few bacteria. CT abdomen pelvis shows early/partial small bowel obstruction; and oval hypodensity over the anterior pelvis just below the dilated small bowel loops, cannot exclude a small abscess; horseshoe kidney; post surgical changes compatible previous cystectomy with ureteral divergent and neobladder. Patient given Cipro in ED for treatment of UTI, pain medication.I consulted general surgery who would like medicine to admit the patient for further evaluation and treatment. Surgery will see the patient once admitted. Patient discussed with Dr. Regenia Skeeter who is in agreement with plan.  Final diagnoses:  Encounter for imaging study to confirm nasogastric (NG) tube placement  Small bowel obstruction (  [place])        Frederica Kuster, PA-C 08/03/15 1421  Sherwood Gambler, MD 08/04/15 [phone]

## 2015-08-03 NOTE — ED Notes (Signed)
Per MD Merrell-Hold NG tube at this time.  No N/V.

## 2015-08-03 NOTE — ED Notes (Signed)
Patient transported to CT 

## 2015-08-04 ENCOUNTER — Inpatient Hospital Stay (HOSPITAL_COMMUNITY): Payer: BLUE CROSS/BLUE SHIELD

## 2015-08-04 DIAGNOSIS — K5669 Other intestinal obstruction: Secondary | ICD-10-CM

## 2015-08-04 DIAGNOSIS — M797 Fibromyalgia: Secondary | ICD-10-CM

## 2015-08-04 DIAGNOSIS — E039 Hypothyroidism, unspecified: Secondary | ICD-10-CM

## 2015-08-04 DIAGNOSIS — F329 Major depressive disorder, single episode, unspecified: Secondary | ICD-10-CM

## 2015-08-04 DIAGNOSIS — K651 Peritoneal abscess: Secondary | ICD-10-CM

## 2015-08-04 DIAGNOSIS — R1084 Generalized abdominal pain: Secondary | ICD-10-CM | POA: Diagnosis not present

## 2015-08-04 DIAGNOSIS — F909 Attention-deficit hyperactivity disorder, unspecified type: Secondary | ICD-10-CM

## 2015-08-04 DIAGNOSIS — R933 Abnormal findings on diagnostic imaging of other parts of digestive tract: Secondary | ICD-10-CM | POA: Diagnosis not present

## 2015-08-04 LAB — COMPREHENSIVE METABOLIC PANEL
ALBUMIN: 2.8 g/dL — AB (ref 3.5–5.0)
ALT: 25 U/L (ref 14–54)
ANION GAP: 8 (ref 5–15)
AST: 19 U/L (ref 15–41)
Alkaline Phosphatase: 60 U/L (ref 38–126)
BUN: 9 mg/dL (ref 6–20)
CHLORIDE: 109 mmol/L (ref 101–111)
CO2: 20 mmol/L — AB (ref 22–32)
Calcium: 8.2 mg/dL — ABNORMAL LOW (ref 8.9–10.3)
Creatinine, Ser: 0.82 mg/dL (ref 0.44–1.00)
GFR calc non Af Amer: >60 mL/min (ref >60)
GLUCOSE: 113 mg/dL — AB (ref 65–99)
Potassium: 3.4 mmol/L — ABNORMAL LOW (ref 3.5–5.1)
SODIUM: 137 mmol/L (ref 135–145)
Total Bilirubin: 0.6 mg/dL (ref 0.3–1.2)
Total Protein: 6.3 g/dL — ABNORMAL LOW (ref 6.5–8.1)

## 2015-08-04 LAB — CBC
HEMATOCRIT: 36.6 % (ref 36.0–46.0)
HEMOGLOBIN: 11.5 g/dL — AB (ref 12.0–15.0)
MCH: 27.7 pg (ref 26.0–34.0)
MCHC: 31.4 g/dL (ref 30.0–36.0)
MCV: 88.2 fL (ref 78.0–100.0)
Platelets: 331 10*3/uL (ref 150–400)
RBC: 4.15 MIL/uL (ref 3.87–5.11)
RDW: 13.2 % (ref 11.5–15.5)
WBC: 12 10*3/uL — ABNORMAL HIGH (ref 4.0–10.5)

## 2015-08-04 LAB — URINE CULTURE

## 2015-08-04 MED ORDER — POTASSIUM CHLORIDE 10 MEQ/100ML IV SOLN
10.0000 meq | INTRAVENOUS | Status: AC
  Administered 2015-08-04 (×4): 10 meq via INTRAVENOUS
  Filled 2015-08-04 (×3): qty 100

## 2015-08-04 NOTE — Progress Notes (Signed)
PROGRESS NOTE    CLEDITH MARSICANO  [id number] DOB: Oct 20, 1980 DOA: 08/03/2015 PCP: Tivis Ringer, MD   Brief Narrative:  HPI on 08/03/2015 by Ms. Sharene Butters, PA DELINAH HUMBERT is a 35 y.o. female with medical history significant for fibromyalgia, Hypothyroidism, depressiion, ADHD, HLDchronic interstitial cystitis s/p cystectomy and [place] pouch, presenting with abdominal pain. The symptoms began on Saturday evening, after arriving to [place]. During that time, she had been avoiding urinating in view of her long-distance trip. Usually she urinates via cath 3x daily, but she did so after 12 hrs. On her right bowel they are, she felt constant stabbing to her suprapubic area, with some diffuse abdominal pain. He had some lightheadedness, without nausea or vomiting, and she was a stable at the time. Thus, she did not seek medical attention, concerned about insurance issues. She returned yesterday from her trip, at which time, her pain was worse, 10 out of 10, with intermittent fever and chills, up to 100 F. She also has intermittent hematuria and decreased UO since Saturday. She took that revealed for pain without relief.the patient feels weak, has not been able to take by mouth's due to these symptoms He will be admitted for observation and management of her symptoms.   Assessment & Plan   Acute abdominal pain/?SBO with interstitial cystitis, neobladder -UA suspicious for UTI, urine culture pending -Gen. surgery consulted and appreciated -CT abdomen pelvis shows early/partial small bowel obstruction likely due to adhesions, 1.5 x 1.9 oval hypodensity suspicious small abscess -Patient currently has no nausea or vomiting, NG tube not place -Gen. surgery did speak with surgery at [facility], continued to decompress bladder. Patient has been self cathing.  -Continue pain control -Continue ciprofloxacin -It does not seem in patient's symptoms have worsened since yesterday. Awaiting  further recommendations from general surgery. Patient may need IR consult for questionable abscess  Hypothyroidism:  -Continue home Synthroid as IV  Hirsutism -hold spironolactone (used for hair growth)  ADHD -Continue Adderal   Depression -Continue Symbyax  Insomnia/ Anxiety -continue PRN ativan  Fibromyalgia -Continue IV Robaxin  DVT Prophylaxis  Lovenox  Code Status: Full  Family Communication: None at bedside  Disposition Plan: Admitted  Consultants General surgery  Procedures  None  Antibiotics   Anti-infectives    Start     Dose/Rate Route Frequency Ordered Stop   08/03/15 2300  ciprofloxacin (CIPRO) IVPB 400 mg     400 mg 200 mL/hr over 60 Minutes Intravenous Every 12 hours 08/03/15 1859     08/03/15 1215  ciprofloxacin (CIPRO) IVPB 400 mg     400 mg 200 mL/hr over 60 Minutes Intravenous  Once 08/03/15 1212 08/03/15 1421      Subjective:   Sanjuana Mae seen and examined today. Continues complaining of abdominal pain. Denies any nausea or vomiting. Patient states her pain has not worsened since admission. She does state that she has been self cathing herself is unable to pass Foley catheter yesterday evening. Patient denies any chest pain, shortness of breath, dizziness or headache at this time.  Objective:   Filed Vitals:   08/03/15 1500 08/03/15 1550 08/03/15 2208 08/04/15 0458  BP:  118/64 111/63 131/70  Pulse:  100 93 85  Temp:  98.5 F (36.9 C) 98.8 F (37.1 C) 98.8 F (37.1 C)  TempSrc:  Oral Axillary Oral  Resp:   18 18  Height: 5\' 6"  (1.676 m)     Weight: 104.1 kg (229 lb 8 oz)  SpO2:  96% 97% 98%    Intake/Output Summary (Last 24 hours) at 08/04/15 1244 Last data filed at 08/04/15 0901  Gross per 24 hour  Intake   1795 ml  Output   1685 ml  Net    110 ml   Filed Weights   08/03/15 1500  Weight: 104.1 kg (229 lb 8 oz)    Exam  General: Well developed, well nourished, NAD, appears stated age  HEENT: NCAT,  mucous  membranes moist.   Cardiovascular: S1 S2 auscultated, no rubs, murmurs or gallops. Regular rate and rhythm.  Respiratory: Clear to auscultation bilaterally   Abdomen: Soft, Obese, diffuse tenderness more so in the suprapubic region, small ostomy noted at the umbilicus.  Extremities: warm dry without cyanosis clubbing or edema  Neuro: AAOx3, nonfocal  Psych: Normal affect and demeanor with intact judgement and insight   Data Reviewed: I have personally reviewed following labs and imaging studies  CBC:  Recent Labs Lab 08/03/15 0930 08/04/15 0320  WBC 13.8* 12.0*  NEUTROABS 10.6*  --   HGB 11.3* 11.5*  HCT 36.0 36.6  MCV 88.0 88.2  PLT 292 [id number]   Basic Metabolic Panel:  Recent Labs Lab 08/03/15 0930 08/04/15 0320  NA 134* 137  K 3.8 3.4*  CL 108 109  CO2 19* 20*  GLUCOSE 122* 113*  BUN 9 9  CREATININE 0.88 0.82  CALCIUM 8.6* 8.2*   GFR: Estimated Creatinine Clearance: 116.7 mL/min (by C-G formula based on Cr of 0.82). Liver Function Tests:  Recent Labs Lab 08/03/15 0930 08/04/15 0320  AST 22 19  ALT 26 25  ALKPHOS 61 60  BILITOT 0.5 0.6  PROT 6.8 6.3*  ALBUMIN 3.1* 2.8*    Recent Labs Lab 08/03/15 0930  LIPASE 18   No results for input(s): AMMONIA in the last 168 hours. Coagulation Profile: No results for input(s): INR, PROTIME in the last 168 hours. Cardiac Enzymes: No results for input(s): CKTOTAL, CKMB, CKMBINDEX, TROPONINI in the last 168 hours. BNP (last 3 results) No results for input(s): PROBNP in the last 8760 hours. HbA1C: No results for input(s): HGBA1C in the last 72 hours. CBG: No results for input(s): GLUCAP in the last 168 hours. Lipid Profile: No results for input(s): CHOL, HDL, LDLCALC, TRIG, CHOLHDL, LDLDIRECT in the last 72 hours. Thyroid Function Tests: No results for input(s): TSH, T4TOTAL, FREET4, T3FREE, THYROIDAB in the last 72 hours. Anemia Panel: No results for input(s): VITAMINB12, FOLATE, FERRITIN, TIBC, IRON,  RETICCTPCT in the last 72 hours. Urine analysis:    Component Value Date/Time   COLORURINE YELLOW 08/03/2015 1038   APPEARANCEUR CLOUDY* 08/03/2015 1038   LABSPEC 1.011 08/03/2015 1038   PHURINE 7.5 08/03/2015 1038   GLUCOSEU NEGATIVE 08/03/2015 1038   HGBUR TRACE* 08/03/2015 1038   [place] 08/03/2015 [place] 08/03/2015 1038   PROTEINUR 30* 08/03/2015 1038   UROBILINOGEN 0.2 02/17/2009 1445   NITRITE POSITIVE* 08/03/2015 1038   LEUKOCYTESUR LARGE* 08/03/2015 1038   Sepsis Labs: @LABRCNTIP (procalcitonin:4,lacticidven:4)  )No results found for this or any previous visit (from the past 240 hour(s)).    Radiology Studies: Ct Abdomen Pelvis W Contrast  08/03/2015  CLINICAL DATA:  Lower abdominal pain five days. History of previous cystectomy with ileal diversion and hysterectomy. EXAM: CT ABDOMEN AND PELVIS WITH CONTRAST TECHNIQUE: Multidetector CT imaging of the abdomen and pelvis was performed using the standard protocol following bolus administration of intravenous contrast. CONTRAST:  160mL ISOVUE-300 IOPAMIDOL (ISOVUE-300) INJECTION 61% COMPARISON:  02/16/2014 and 12/22/2010 FINDINGS: Lung bases demonstrate mild right basilar atelectasis. Tiny amount of right pleural fluid. Abdominal images demonstrate a normal liver, spleen, pancreas, gallbladder and adrenal glands. Horseshoe kidney is unchanged without hydronephrosis or nephrolithiasis. There are postsurgical changes compatible patient's prior cystectomy with ureteral diversion and creation of neobladder over the right lower quadrant. There are few mildly dilated small bowel loops just proximal to the neobladder in the right lower quadrant as this may be early/partial obstruction. There is mild stranding of the adjacent fat and minimal fluid in the right pericolic gutter. There is a 1.5 x 1.9 cm rounded hypodensity over the anterior pelvis just below the dilated small bowel loops of uncertain clinical  significance, although cannot exclude a small abscess. There is a surgical suture line over the colon in the right lower quadrant. Remaining pelvic images demonstrate a small amount free fluid as well as several surgical clips. Rectosigmoid colon is within normal. Neural stimulator device is present over the left gluteal region with leads extending into the mid thoracic canal at the T8 level. Minimal degenerative change of the hips. IMPRESSION: Postsurgical changes compatible previous cystectomy with ureteral diversion and neobladder within the right lower quadrant. There are several dilated small bowel loops leading into the region of the neobladder in the right lower quadrant with mild adjacent inflammatory change and minimal associated free fluid. Findings likely represent early/ partial small bowel obstruction likely due to adhesions. 1.5 x 1.9 cm oval hypodensity over the anterior pelvis just below the dilated small bowel loops. Cannot exclude a small abscess. Recommend attention on follow-up. Horseshoe kidney. Electronically Signed   By: Marin Olp M.D.   On: 08/03/2015 11:27     Scheduled Meds: . amphetamine-dextroamphetamine  30 mg Oral Daily  . ciprofloxacin  400 mg Intravenous Q12H  . diatrizoate meglumine-sodium  90 mL Per NG tube Once  . enoxaparin (LOVENOX) injection  40 mg Subcutaneous Q24H  . levothyroxine  68.5 mcg Intravenous QAC breakfast  . OLANZapine-FLUoxetine  1 capsule Oral QHS   Continuous Infusions: . sodium chloride 100 mL/hr at 08/04/15 0239     LOS: 1 day   Time Spent in minutes   30 minutes  Jazmeen Axtell D.O. on 08/04/2015 at 12:44 PM  Between 7am to 7pm - Pager - [phone]  After 7pm go to [email or web address] - password [email or web address]  And look for the night coverage person covering for me after hours  [facility] Office  [phone]

## 2015-08-04 NOTE — Progress Notes (Signed)
Pt. Washed her arms and ace pt. Stated she will be taking a shower when she gets home tomorrow

## 2015-08-04 NOTE — Progress Notes (Addendum)
Subjective: Still with abd pain, not worse than yesterday but not significantly better. No n/v. No burping. No flatus. No BM. Foley in urostomy didn't drain initially last night so pt has been self cath frequently  Objective: Vital signs in last 24 hours: Temp:  [98.5 F (36.9 C)-98.8 F (37.1 C)] 98.8 F (37.1 C) (06/01 0458) Pulse Rate:  [85-109] 85 (06/01 0458) Resp:  [18] 18 (06/01 0458) BP: (111-131)/(63-85) 131/70 mmHg (06/01 0458) SpO2:  [96 %-100 %] 98 % (06/01 0458) Weight:  [104.1 kg (229 lb 8 oz)] 104.1 kg (229 lb 8 oz) (05/31 1500) Last BM Date: 08/02/15  Intake/Output from previous day: 05/31 0701 - 06/01 0700 In: 1795 [P.O.:180; I.V.:1415; IV Piggyback:200] Out: 1410 [Urine:1410] Intake/Output this shift: Total I/O In: -  Out: 275 [Urine:275]  Alert, nontoxic Obese, soft, mild TTP throughout more in Rt mid/LQ.  Patent [place] pouch ostomy  Lab Results:   Recent Labs  08/03/15 0930 08/04/15 0320  WBC 13.8* 12.0*  HGB 11.3* 11.5*  HCT 36.0 36.6  PLT 292 331   BMET  Recent Labs  08/03/15 0930 08/04/15 0320  NA 134* 137  K 3.8 3.4*  CL 108 109  CO2 19* 20*  GLUCOSE 122* 113*  BUN 9 9  CREATININE 0.88 0.82  CALCIUM 8.6* 8.2*   PT/INR No results for input(s): LABPROT, INR in the last 72 hours. ABG No results for input(s): PHART, HCO3 in the last 72 hours.  Invalid input(s): PCO2, PO2  Studies/Results: Ct Abdomen Pelvis W Contrast  08/03/2015  CLINICAL DATA:  Lower abdominal pain five days. History of previous cystectomy with ileal diversion and hysterectomy. EXAM: CT ABDOMEN AND PELVIS WITH CONTRAST TECHNIQUE: Multidetector CT imaging of the abdomen and pelvis was performed using the standard protocol following bolus administration of intravenous contrast. CONTRAST:  139mL ISOVUE-300 IOPAMIDOL (ISOVUE-300) INJECTION 61% COMPARISON:  02/16/2014 and 12/22/2010 FINDINGS: Lung bases demonstrate mild right basilar atelectasis. Tiny amount of  right pleural fluid. Abdominal images demonstrate a normal liver, spleen, pancreas, gallbladder and adrenal glands. Horseshoe kidney is unchanged without hydronephrosis or nephrolithiasis. There are postsurgical changes compatible patient's prior cystectomy with ureteral diversion and creation of neobladder over the right lower quadrant. There are few mildly dilated small bowel loops just proximal to the neobladder in the right lower quadrant as this may be early/partial obstruction. There is mild stranding of the adjacent fat and minimal fluid in the right pericolic gutter. There is a 1.5 x 1.9 cm rounded hypodensity over the anterior pelvis just below the dilated small bowel loops of uncertain clinical significance, although cannot exclude a small abscess. There is a surgical suture line over the colon in the right lower quadrant. Remaining pelvic images demonstrate a small amount free fluid as well as several surgical clips. Rectosigmoid colon is within normal. Neural stimulator device is present over the left gluteal region with leads extending into the mid thoracic canal at the T8 level. Minimal degenerative change of the hips. IMPRESSION: Postsurgical changes compatible previous cystectomy with ureteral diversion and neobladder within the right lower quadrant. There are several dilated small bowel loops leading into the region of the neobladder in the right lower quadrant with mild adjacent inflammatory change and minimal associated free fluid. Findings likely represent early/ partial small bowel obstruction likely due to adhesions. 1.5 x 1.9 cm oval hypodensity over the anterior pelvis just below the dilated small bowel loops. Cannot exclude a small abscess. Recommend attention on follow-up. Horseshoe kidney. Electronically Signed  By: Marin Olp M.D.   On: 08/03/2015 11:27    Anti-infectives: Anti-infectives    Start     Dose/Rate Route Frequency Ordered Stop   08/03/15 2300  ciprofloxacin (CIPRO)  IVPB 400 mg     400 mg 200 mL/hr over 60 Minutes Intravenous Every 12 hours 08/03/15 1859     08/03/15 1215  ciprofloxacin (CIPRO) IVPB 400 mg     400 mg 200 mL/hr over 60 Minutes Intravenous  Once 08/03/15 1212 08/03/15 1421      Assessment/Plan: H/o IC s/p cystectomy with [place] pouch creation several yrs ago [facility] by Dr Amalia Hailey Mesenteric inflammation Small Intra-abdominal fluid collection, possible abscess Ileus Possible UTI  No fever. No tachycardia. Wbc down some. No peritonitis.  Cont IV abx Pt to self cath very frequently to prevent distension of neobladder Check abd plain films.  Unclear etiology as to current abd issues; current thinking is neobladder got overdistended over weekend from not self cath regularly and developed microleak causing surrounding mesenteric inflammation, SB dilation, and fluid collection Follow urine culture Pt may have some sips of liquids  Trying to reach Dr Amalia Hailey at [facility] to discuss case.   Addendum-spoke with Dr. Amalia Hailey at [facility]. In describing patient's history as well as CT findings he completely agrees that she probably had a small leak from her neobladder. Since there is no signs of massive free air or systemic toxicity he recommended discharging the patient with outpatient follow-up at [facility]. I believe we should at least give the patient a diet and make sure she can tolerate it before we discharge her. He did notthink the patient needed to be on antibiotics per se. I talked with the patient via phone and relayed the recommendations from [facility] to her. Hopefully we can discharge her tomorrow as long as she tolerates liquids this evening.   Leighton Ruff. Redmond Pulling, MD, FACS General, Bariatric, & Minimally Invasive Surgery [facility] Surgery, [place]     LOS: 1 day    Gayland Curry 08/04/2015

## 2015-08-04 NOTE — Care Management Note (Signed)
Case Management Note  Patient Details  Name: [name] MRN: [id number] Date of Birth: January 06, 1981  Subjective/Objective:                    Action/Plan:   Expected Discharge Date:                  Expected Discharge Plan:  Home/Self Care  In-House Referral:     Discharge planning Services     Post Acute Care Choice:    Choice offered to:     DME Arranged:    DME Agency:     HH Arranged:    [place] Agency:     Status of Service:  In process, will continue to follow  Medicare Important Message Given:    Date Medicare IM Given:    Medicare IM give by:    Date Additional Medicare IM Given:    Additional Medicare Important Message give by:     If discussed at [place] of Stay Meetings, dates discussed:    Additional Comments:  Marilu Favre, RN 08/04/2015, 11:14 AM

## 2015-08-04 NOTE — Progress Notes (Signed)
Ice chips 

## 2015-08-04 NOTE — Progress Notes (Signed)
Pt's foley thru [place] pouch is not draining; Dc'd foley and put new one in but still no urine return.Pt stated that she feels full and needed to void. Made on call aware. Order for straight cath was put in and executed. Got 870ml out. Will continue to monitor.

## 2015-08-05 DIAGNOSIS — R933 Abnormal findings on diagnostic imaging of other parts of digestive tract: Secondary | ICD-10-CM | POA: Diagnosis not present

## 2015-08-05 DIAGNOSIS — K567 Ileus, unspecified: Principal | ICD-10-CM

## 2015-08-05 DIAGNOSIS — R1084 Generalized abdominal pain: Secondary | ICD-10-CM | POA: Diagnosis not present

## 2015-08-05 LAB — BASIC METABOLIC PANEL
Anion gap: 7 (ref 5–15)
BUN: 5 mg/dL — AB (ref 6–20)
CHLORIDE: 107 mmol/L (ref 101–111)
CO2: 23 mmol/L (ref 22–32)
Calcium: 8.4 mg/dL — ABNORMAL LOW (ref 8.9–10.3)
Creatinine, Ser: 0.93 mg/dL (ref 0.44–1.00)
GFR calc Af Amer: >60 mL/min (ref >60)
GLUCOSE: 119 mg/dL — AB (ref 65–99)
POTASSIUM: 3.5 mmol/L (ref 3.5–5.1)
Sodium: 137 mmol/L (ref 135–145)

## 2015-08-05 LAB — CBC
HCT: 35.4 % — ABNORMAL LOW (ref 36.0–46.0)
Hemoglobin: 11 g/dL — ABNORMAL LOW (ref 12.0–15.0)
MCH: 27 pg (ref 26.0–34.0)
MCHC: 31.1 g/dL (ref 30.0–36.0)
MCV: 86.8 fL (ref 78.0–100.0)
PLATELETS: 361 10*3/uL (ref 150–400)
RBC: 4.08 MIL/uL (ref 3.87–5.11)
RDW: 13 % (ref 11.5–15.5)
WBC: 14.2 10*3/uL — ABNORMAL HIGH (ref 4.0–10.5)

## 2015-08-05 MED ORDER — SENNOSIDES-DOCUSATE SODIUM 8.6-50 MG PO TABS: 1.0000 | ORAL_TABLET | Freq: Every evening | ORAL | Status: DC | PRN

## 2015-08-05 MED ORDER — BISACODYL 10 MG RE SUPP: 10.0000 mg | Freq: Every day | RECTAL | Status: DC | PRN

## 2015-08-05 MED ORDER — CIPROFLOXACIN HCL 500 MG PO TABS: 500.0000 mg | ORAL_TABLET | Freq: Two times a day (BID) | ORAL | Status: DC

## 2015-08-05 NOTE — Progress Notes (Signed)
Subjective: Pain is still there but better. No n/v. +flatus/bm. Tolerated liquids  Objective: Vital signs in last 24 hours: Temp:  [98.5 F (36.9 C)-99.4 F (37.4 C)] 98.5 F (36.9 C) (06/02 0427) Pulse Rate:  [90-100] 90 (06/02 0427) Resp:  [16-18] 16 (06/02 0427) BP: (124-130)/(68-78) 126/68 mmHg (06/02 0427) SpO2:  [97 %-98 %] 98 % (06/02 0427) Last BM Date: 08/05/15  Intake/Output from previous day: 06/01 0701 - 06/02 0700 In: 3206.7 [P.O.:900; I.V.:2106.7; IV Piggyback:200] Out: [id number] [Urine:3175] Intake/Output this shift:    Alert, not toxic Obese, soft, mild TTp  Lab Results:   Recent Labs  08/04/15 0320 08/05/15 0537  WBC 12.0* 14.2*  HGB 11.5* 11.0*  HCT 36.6 35.4*  PLT 331 361   BMET  Recent Labs  08/04/15 0320 08/05/15 0537  NA 137 137  K 3.4* 3.5  CL 109 107  CO2 20* 23  GLUCOSE 113* 119*  BUN 9 5*  CREATININE 0.82 0.93  CALCIUM 8.2* 8.4*   PT/INR No results for input(s): LABPROT, INR in the last 72 hours. ABG No results for input(s): PHART, HCO3 in the last 72 hours.  Invalid input(s): PCO2, PO2  Studies/Results: Ct Abdomen Pelvis W Contrast  08/03/2015  CLINICAL DATA:  Lower abdominal pain five days. History of previous cystectomy with ileal diversion and hysterectomy. EXAM: CT ABDOMEN AND PELVIS WITH CONTRAST TECHNIQUE: Multidetector CT imaging of the abdomen and pelvis was performed using the standard protocol following bolus administration of intravenous contrast. CONTRAST:  154mL ISOVUE-300 IOPAMIDOL (ISOVUE-300) INJECTION 61% COMPARISON:  02/16/2014 and 12/22/2010 FINDINGS: Lung bases demonstrate mild right basilar atelectasis. Tiny amount of right pleural fluid. Abdominal images demonstrate a normal liver, spleen, pancreas, gallbladder and adrenal glands. Horseshoe kidney is unchanged without hydronephrosis or nephrolithiasis. There are postsurgical changes compatible patient's prior cystectomy with ureteral diversion and creation of  neobladder over the right lower quadrant. There are few mildly dilated small bowel loops just proximal to the neobladder in the right lower quadrant as this may be early/partial obstruction. There is mild stranding of the adjacent fat and minimal fluid in the right pericolic gutter. There is a 1.5 x 1.9 cm rounded hypodensity over the anterior pelvis just below the dilated small bowel loops of uncertain clinical significance, although cannot exclude a small abscess. There is a surgical suture line over the colon in the right lower quadrant. Remaining pelvic images demonstrate a small amount free fluid as well as several surgical clips. Rectosigmoid colon is within normal. Neural stimulator device is present over the left gluteal region with leads extending into the mid thoracic canal at the T8 level. Minimal degenerative change of the hips. IMPRESSION: Postsurgical changes compatible previous cystectomy with ureteral diversion and neobladder within the right lower quadrant. There are several dilated small bowel loops leading into the region of the neobladder in the right lower quadrant with mild adjacent inflammatory change and minimal associated free fluid. Findings likely represent early/ partial small bowel obstruction likely due to adhesions. 1.5 x 1.9 cm oval hypodensity over the anterior pelvis just below the dilated small bowel loops. Cannot exclude a small abscess. Recommend attention on follow-up. Horseshoe kidney. Electronically Signed   By: Marin Olp M.D.   On: 08/03/2015 11:27   Dg Abd 2 Views  08/04/2015  CLINICAL DATA:  Abdominal pain.  History of small-bowel obstruction. EXAM: ABDOMEN - 2 VIEW COMPARISON:  CT of the abdomen 08/03/2015 FINDINGS: There is sub pathologic gaseous distention of small-bowel loops of up to 3  cm. There is no evidence of free air. No radio-opaque calculi or other significant radiographic abnormality is seen. Postsurgical changes are noted in the lower abdomen. Spinal  stimulator is in place. IMPRESSION: Mild sub pathologic gaseous small bowel distention. Electronically Signed   By: Fidela Salisbury M.D.   On: 08/04/2015 13:20    Anti-infectives: Anti-infectives    Start     Dose/Rate Route Frequency Ordered Stop   08/03/15 2300  ciprofloxacin (CIPRO) IVPB 400 mg     400 mg 200 mL/hr over 60 Minutes Intravenous Every 12 hours 08/03/15 1859     08/03/15 1215  ciprofloxacin (CIPRO) IVPB 400 mg     400 mg 200 mL/hr over 60 Minutes Intravenous  Once 08/03/15 1212 08/03/15 1421      Assessment/Plan: H/o IC s/p cystectomy with [place] pouch creation several yrs ago [facility] by Dr Amalia Hailey Mesenteric inflammation Small Intra-abdominal fluid collection, possible abscess - to small to drain.  Ileus -resolved  Possible UTI - cx essentially negative  Believe she is safe for dc. No fever/tachycardia. Tolerating diet. Having bowel function. Would send out on abx. Discussed bowel regimen with pt. Needs outpt follow up with Dr Amalia Hailey at [facility]. If has worsening pain, issues needs to go to [facility]. Discussed with [facility]  [name]. Redmond Pulling, MD, FACS General, Bariatric, & Minimally Invasive Surgery [facility] Surgery, [place]   LOS: 2 days    [name] 08/05/2015

## 2015-08-05 NOTE — Discharge Instructions (Signed)
Abdominal Pain, Adult Many things can cause abdominal pain. Usually, abdominal pain is not caused by a disease and will improve without treatment. It can often be observed and treated at home. Your health care provider will do a physical exam and possibly order blood tests and X-rays to help determine the seriousness of your pain. However, in many cases, more time must pass before a clear cause of the pain can be found. Before that point, your health care provider may not know if you need more testing or further treatment. HOME CARE INSTRUCTIONS Monitor your abdominal pain for any changes. The following actions may help to alleviate any discomfort you are experiencing:  Only take over-the-counter or prescription medicines as directed by your health care provider.  Do not take laxatives unless directed to do so by your health care provider.  Try a clear liquid diet (broth, tea, or water) as directed by your health care provider. Slowly move to a bland diet as tolerated. SEEK MEDICAL CARE IF:  You have unexplained abdominal pain.  You have abdominal pain associated with nausea or diarrhea.  You have pain when you urinate or have a bowel movement.  You experience abdominal pain that wakes you in the night.  You have abdominal pain that is worsened or improved by eating food.  You have abdominal pain that is worsened with eating fatty foods.  You have a fever. SEEK IMMEDIATE MEDICAL CARE IF:  Your pain does not go away within 2 hours.  You keep throwing up (vomiting).  Your pain is felt only in portions of the abdomen, such as the right side or the left lower portion of the abdomen.  You pass bloody or black tarry stools. MAKE SURE YOU:  Understand these instructions.  Will watch your condition.  Will get help right away if you are not doing well or get worse.   This information is not intended to replace advice given to you by your health care provider. Make sure you discuss  any questions you have with your health care provider.   Document Released: 11/29/2004 Document Revised: 11/10/2014 Document Reviewed: 10/29/2012 Elsevier Interactive Patient Education [employer]. Ileus  Ileus is a condition in which the intestines, also called the bowels, stop working and moving correctly. If the intestines stop working, food cannot pass through to get digested. The intestines are hollow organs that digest food after the food leaves the stomach. These organs are long, muscular tubes that connect the stomach to the rectum. When ileus occurs, the muscular contractions that cause food to move through the intestines stop happening as they normally would. Ileus can occur for various reasons. This condition is a serious problem that usually requires hospitalization. It can cause symptoms such as nausea, abdominal pain, and bloating. Ileus can last from a few hours to a few days. If the intestines stop working because of a blockage, that is a different condition that is called a bowel obstruction. CAUSES This condition may be caused by:  Surgery on the abdomen.  An infection or inflammation in the abdomen. This includes inflammation of the lining of the abdomen (peritonitis).  Infection or inflammation in other parts of the body, such as pneumonia or pancreatitis.  Passage of gallstones or kidney stones.  Damage to the nerves or blood vessels that go to the intestines.  A collection of blood within the abdominal cavity.  Imbalance in the salts in the blood (electrolytes).  Injury to the brain or spinal cord.  Medicines.  Many medicines, including strong pain medicines, can cause ileus or make it worse. SYMPTOMS Symptoms of this condition include:  Bloating of the abdomen.  Pain or discomfort in the abdomen.  Poor appetite.  Nausea and vomiting.  Lack of normal bowel sounds, such as "growling" in the stomach. DIAGNOSIS This condition may be diagnosed  with:  A physical exam and medical history.  X-rays or a CT scan of the abdomen. You may also have other tests to help find the cause of the condition. TREATMENT Treatment for this condition may include:  Resting the intestines until they start to work again. This is often done by:  Stopping oral intake of food and drink. You will be given fluid through an IV tube to prevent dehydration.  Placing a small tube (nasogastric tube or NG tube) that is passed through your nose and into your stomach. The tube is attached to a suction device and keeps the stomach emptied out. This allows the bowels to rest and also helps to reduce nausea and vomiting.  Correcting any electrolyte imbalance by giving supplements in the IV fluid.  Stopping any medicines that might make ileus worse.  Treating any condition that may have caused ileus. HOME CARE INSTRUCTIONS  Follow instructions from your health care provider about diet and fluid intake. Usually, you will be told to:  Drink plenty of clear fluids.  Avoid alcohol.  Avoid caffeine.  Eat a bland diet.  Get plenty of rest. Return to your normal activities as told by your health care provider.  Take over-the-counter and prescription medicines only as told by your health care provider.  Keep all follow-up visits as told by your health care provider. This is important. SEEK MEDICAL CARE IF:  You have nausea, vomiting, or abdominal discomfort.  You have a fever. SEEK IMMEDIATE MEDICAL CARE IF:  You have severe abdominal pain or bloating.  You cannot eat or drink without vomiting.   This information is not intended to replace advice given to you by your health care provider. Make sure you discuss any questions you have with your health care provider.   Document Released: 02/22/2003 Document Revised: 11/10/2014 Document Reviewed: 04/15/2014 Elsevier Interactive Patient Education [employer].

## 2015-08-05 NOTE — Discharge Summary (Addendum)
Physician Discharge Summary  [name] [id number] DOB: [birth year] DOA: 08/03/2015  PCP: Tivis Ringer, MD  Admit date: 08/03/2015 Discharge date: 08/05/2015  Time spent: 45 minutes  Recommendations for Outpatient Follow-up:  Patient will be discharged to home.  Patient will need to follow up with primary care provider within one week of discharge.  Follow up with Dr. Amalia Hailey at [facility].   Patient should continue medications as prescribed.  Patient should follow a liquid diet and advance to soft diet as tolerated.  Discharge Diagnoses:  Acute abdominal pain/?SBO vs ileus with interstitial cystitis, neobladder Hypothyroidism Hercules some ADHD Depression Insomnia/anxiety Fibromyalgia  Discharge Condition: Stable  Diet recommendation: Advance to soft diet as tolerated  Filed Weights   08/03/15 1500  Weight: 104.1 kg (229 lb 8 oz)    History of present illness:  on 08/03/2015 by Ms. Sharene Butters, PA [name] is a 35 y.o. female with medical history significant for fibromyalgia, Hypothyroidism, depressiion, ADHD, HLDchronic interstitial cystitis s/p cystectomy and [place] pouch, presenting with abdominal pain. The symptoms began on Saturday evening, after arriving to [place]. During that time, she had been avoiding urinating in view of her long-distance trip. Usually she urinates via cath 3x daily, but she did so after 12 hrs. On her right bowel they are, she felt constant stabbing to her suprapubic area, with some diffuse abdominal pain. He had some lightheadedness, without nausea or vomiting, and she was a stable at the time. Thus, she did not seek medical attention, concerned about insurance issues. She returned yesterday from her trip, at which time, her pain was worse, 10 out of 10, with intermittent fever and chills, up to 100 F. She also has intermittent hematuria and decreased UO since Saturday. She took that revealed for pain without relief.the patient feels  weak, has not been able to take by mouth's due to these symptoms He will be admitted for observation and management of her symptoms.   Hospital Course:  Acute abdominal pain/?SBO vs ileus with interstitial cystitis, neobladder -UA suspicious for UTI, urine culture showed multiple species -CT abdomen pelvis shows early/partial small bowel obstruction likely due to adhesions, 1.5 x 1.9 oval hypodensity suspicious small abscess -Patient currently has no nausea or vomiting, NG tube not place -Gen. surgery consulted and appreciated- spoke with Dr. Amalia Hailey at [facility] to discharge and follow up as an outpatient, patient needs to continue self cath. Feels abscess is too small for drainage by IR -Continue pain control -Continue ciprofloxacin -symptoms mildly improved.  -Placed on liquid diet, and tolerated well.  Positive bowel sounds, +flatus and bowel movement -Continue bowel regimen  Hypothyroidism:  -Continue synthroid  Hirsutism -hold spironolactone (used for hair growth)- continue at discharge  ADHD -Continue Adderal   Depression -Continue Symbyax  Insomnia/ Anxiety -continue home medication  Fibromyalgia -Continue home medication  Consultants General surgery  Procedures  None  Discharge Exam: Filed Vitals:   08/04/15 2030 08/05/15 0427  BP: 130/78 126/68  Pulse: 100 90  Temp: 99.4 F (37.4 C) 98.5 F (36.9 C)  Resp: 16 16    Exam  General: Well developed, well nourished, NAD  HEENT: NCAT, mucous membranes moist.   Cardiovascular: S1 S2 auscultated, no murmurs, RRR  Respiratory: Clear to auscultation bilaterally  Abdomen: Soft, Obese, mild diffuse TTP, nondistended, +BS  Extremities: warm dry without cyanosis clubbing or edema  Neuro: AAOx3, nonfocal  Psych: Normal affect and demeanor, pleasant  Discharge Instructions      Discharge Instructions  Discharge instructions    Complete by:  As directed   Patient will be discharged to home.   Patient will need to follow up with primary care provider within one week of discharge.  Follow up with Dr. Amalia Hailey at [facility].   Patient should continue medications as prescribed.  Patient should follow a liquid diet and advance to soft diet as tolerated.            Medication List    STOP taking these medications        nitrofurantoin 50 MG capsule  Commonly known as:  MACRODANTIN      TAKE these medications        amphetamine-dextroamphetamine 30 MG tablet  Commonly known as:  ADDERALL  Take 30 mg by mouth daily.     BELBUCA 300 MCG Film  Generic drug:  Buprenorphine HCl  Place 300 mcg inside cheek 2 (two) times daily.     bisacodyl 10 MG suppository  Commonly known as:  DULCOLAX  Place 1 suppository (10 mg total) rectally daily as needed for moderate constipation.     cetirizine 10 MG tablet  Commonly known as:  ZYRTEC  Take 10 mg by mouth daily.     ciprofloxacin 500 MG tablet  Commonly known as:  CIPRO  Take 1 tablet (500 mg total) by mouth 2 (two) times daily.     clonazePAM 1 MG tablet  Commonly known as:  KLONOPIN  Take 2 tablets by mouth at bedtime.     fenofibrate 48 MG tablet  Commonly known as:  TRICOR  Take 48 mg by mouth daily.     ibuprofen 200 MG tablet  Commonly known as:  ADVIL,MOTRIN  Take 400 mg by mouth every 6 (six) hours as needed for mild pain.     levothyroxine 137 MCG tablet  Commonly known as:  SYNTHROID, LEVOTHROID  Take 137 mcg by mouth daily.     multivitamin capsule  Take 1 capsule by mouth daily.     OLANZapine-FLUoxetine 12-50 MG capsule  Commonly known as:  SYMBYAX  Take 1 capsule by mouth at bedtime.     senna-docusate 8.6-50 MG tablet  Commonly known as:  Senokot-S  Take 1 tablet by mouth at bedtime as needed for mild constipation.     spironolactone 100 MG tablet  Commonly known as:  ALDACTONE  Take 100 mg by mouth at bedtime.     tiZANidine 4 MG capsule  Commonly known as:  ZANAFLEX  Take 4 mg by mouth at  bedtime.     topiramate 100 MG tablet  Commonly known as:  TOPAMAX  Take 100 mg by mouth 2 (two) times daily.     traZODone 50 MG tablet  Commonly known as:  DESYREL  Take 150 mg by mouth at bedtime.     Vitamin D (Ergocalciferol) 50000 units Caps capsule  Commonly known as:  DRISDOL  Take 50,000 Units by mouth 2 (two) times a week. Sunday and Wednesday       Allergies  Allergen Reactions  . Keflex [Cephalexin]   . Penicillins Hives    Has patient had a PCN reaction causing immediate rash, facial/tongue/throat swelling, SOB or lightheadedness with hypotension: Yes Has patient had a PCN reaction causing severe rash involving mucus membranes or skin necrosis: No Has patient had a PCN reaction that required hospitalization No Has patient had a PCN reaction occurring within the last 10 years: No If all of the above answers are "NO", then may  proceed with Cephalosporin use.   . Sulfa Antibiotics    Follow-up Information    Follow up with Tivis Ringer, MD. Schedule an appointment as soon as possible for a visit in 1 week.   Specialty:  Internal Medicine   Why:  Hospital follow up   Contact information:   [address] [place] [address] [phone]       Follow up with Domingo Pulse, MD. Schedule an appointment as soon as possible for a visit in 1 week.   Specialty:  Urology   Why:  Hospital follow up   Contact information:   [place] [id number] [phone]        The results of significant diagnostics from this hospitalization (including imaging, microbiology, ancillary and laboratory) are listed below for reference.    Significant Diagnostic Studies: Ct Abdomen Pelvis W Contrast  08/03/2015  CLINICAL DATA:  Lower abdominal pain five days. History of previous cystectomy with ileal diversion and hysterectomy. EXAM: CT ABDOMEN AND PELVIS WITH CONTRAST TECHNIQUE: Multidetector CT imaging of the abdomen and pelvis was performed using the  standard protocol following bolus administration of intravenous contrast. CONTRAST:  146mL ISOVUE-300 IOPAMIDOL (ISOVUE-300) INJECTION 61% COMPARISON:  02/16/2014 and 12/22/2010 FINDINGS: Lung bases demonstrate mild right basilar atelectasis. Tiny amount of right pleural fluid. Abdominal images demonstrate a normal liver, spleen, pancreas, gallbladder and adrenal glands. Horseshoe kidney is unchanged without hydronephrosis or nephrolithiasis. There are postsurgical changes compatible patient's prior cystectomy with ureteral diversion and creation of neobladder over the right lower quadrant. There are few mildly dilated small bowel loops just proximal to the neobladder in the right lower quadrant as this may be early/partial obstruction. There is mild stranding of the adjacent fat and minimal fluid in the right pericolic gutter. There is a 1.5 x 1.9 cm rounded hypodensity over the anterior pelvis just below the dilated small bowel loops of uncertain clinical significance, although cannot exclude a small abscess. There is a surgical suture line over the colon in the right lower quadrant. Remaining pelvic images demonstrate a small amount free fluid as well as several surgical clips. Rectosigmoid colon is within normal. Neural stimulator device is present over the left gluteal region with leads extending into the mid thoracic canal at the T8 level. Minimal degenerative change of the hips. IMPRESSION: Postsurgical changes compatible previous cystectomy with ureteral diversion and neobladder within the right lower quadrant. There are several dilated small bowel loops leading into the region of the neobladder in the right lower quadrant with mild adjacent inflammatory change and minimal associated free fluid. Findings likely represent early/ partial small bowel obstruction likely due to adhesions. 1.5 x 1.9 cm oval hypodensity over the anterior pelvis just below the dilated small bowel loops. Cannot exclude a small  abscess. Recommend attention on follow-up. Horseshoe kidney. Electronically Signed   By: Marin Olp M.D.   On: 08/03/2015 11:27   Dg Abd 2 Views  08/04/2015  CLINICAL DATA:  Abdominal pain.  History of small-bowel obstruction. EXAM: ABDOMEN - 2 VIEW COMPARISON:  CT of the abdomen 08/03/2015 FINDINGS: There is sub pathologic gaseous distention of small-bowel loops of up to 3 cm. There is no evidence of free air. No radio-opaque calculi or other significant radiographic abnormality is seen. Postsurgical changes are noted in the lower abdomen. Spinal stimulator is in place. IMPRESSION: Mild sub pathologic gaseous small bowel distention. Electronically Signed   By: Fidela Salisbury M.D.   On: 08/04/2015 13:20  Microbiology: Recent Results (from the past 240 hour(s))  Culture, Urine     Status: Abnormal   Collection Time: 08/03/15 10:38 AM  Result Value Ref Range Status   Specimen Description URINE, RANDOM  Final   Special Requests NONE  Final   Culture MULTIPLE SPECIES PRESENT, SUGGEST RECOLLECTION (A)  Final   Report Status 08/04/2015 FINAL  Final     Labs: Basic Metabolic Panel:  Recent Labs Lab 08/03/15 0930 08/04/15 0320 08/05/15 0537  NA 134* 137 137  K 3.8 3.4* 3.5  CL 108 109 107  CO2 19* 20* 23  GLUCOSE 122* 113* 119*  BUN 9 9 5*  CREATININE 0.88 0.82 0.93  CALCIUM 8.6* 8.2* 8.4*   Liver Function Tests:  Recent Labs Lab 08/03/15 0930 08/04/15 0320  AST 22 19  ALT 26 25  ALKPHOS 61 60  BILITOT 0.5 0.6  PROT 6.8 6.3*  ALBUMIN 3.1* 2.8*    Recent Labs Lab 08/03/15 0930  LIPASE 18   No results for input(s): AMMONIA in the last 168 hours. CBC:  Recent Labs Lab 08/03/15 0930 08/04/15 0320 08/05/15 0537  WBC 13.8* 12.0* 14.2*  NEUTROABS 10.6*  --   --   HGB 11.3* 11.5* 11.0*  HCT 36.0 36.6 35.4*  MCV 88.0 88.2 86.8  PLT 292 331 361   Cardiac Enzymes: No results for input(s): CKTOTAL, CKMB, CKMBINDEX, TROPONINI in the last 168 hours. BNP: BNP  (last 3 results) No results for input(s): BNP in the last 8760 hours.  ProBNP (last 3 results) No results for input(s): PROBNP in the last 8760 hours.  CBG: No results for input(s): GLUCAP in the last 168 hours.     SignedCristal Ford  [facility] 08/05/2015, 10:28 AM

## 2015-08-05 NOTE — Progress Notes (Signed)
Patient discharged to home with instructions. 

## 2015-08-09 DIAGNOSIS — R1033 Periumbilical pain: Secondary | ICD-10-CM | POA: Diagnosis not present

## 2015-08-09 DIAGNOSIS — Z9889 Other specified postprocedural states: Secondary | ICD-10-CM | POA: Diagnosis not present

## 2015-08-11 DIAGNOSIS — R1084 Generalized abdominal pain: Secondary | ICD-10-CM | POA: Diagnosis not present

## 2015-08-11 DIAGNOSIS — R0682 Tachypnea, not elsewhere classified: Secondary | ICD-10-CM | POA: Diagnosis not present

## 2015-08-11 DIAGNOSIS — R509 Fever, unspecified: Secondary | ICD-10-CM | POA: Diagnosis not present

## 2015-08-11 DIAGNOSIS — E872 Acidosis: Secondary | ICD-10-CM | POA: Diagnosis not present

## 2015-08-11 DIAGNOSIS — R000 Tachycardia, unspecified: Secondary | ICD-10-CM | POA: Diagnosis not present

## 2015-08-11 DIAGNOSIS — R188 Other ascites: Secondary | ICD-10-CM | POA: Diagnosis not present

## 2015-08-11 DIAGNOSIS — R198 Other specified symptoms and signs involving the digestive system and abdomen: Secondary | ICD-10-CM | POA: Diagnosis not present

## 2015-08-11 DIAGNOSIS — N289 Disorder of kidney and ureter, unspecified: Secondary | ICD-10-CM | POA: Diagnosis not present

## 2015-08-11 DIAGNOSIS — R109 Unspecified abdominal pain: Secondary | ICD-10-CM | POA: Diagnosis not present

## 2015-08-11 DIAGNOSIS — D72828 Other elevated white blood cell count: Secondary | ICD-10-CM | POA: Diagnosis not present

## 2015-08-11 DIAGNOSIS — Z936 Other artificial openings of urinary tract status: Secondary | ICD-10-CM | POA: Diagnosis not present

## 2015-08-12 DIAGNOSIS — R000 Tachycardia, unspecified: Secondary | ICD-10-CM | POA: Diagnosis not present

## 2015-08-12 DIAGNOSIS — R188 Other ascites: Secondary | ICD-10-CM | POA: Diagnosis not present

## 2015-08-12 DIAGNOSIS — R198 Other specified symptoms and signs involving the digestive system and abdomen: Secondary | ICD-10-CM | POA: Diagnosis not present

## 2015-08-12 DIAGNOSIS — M797 Fibromyalgia: Secondary | ICD-10-CM | POA: Diagnosis not present

## 2015-08-12 DIAGNOSIS — G8929 Other chronic pain: Secondary | ICD-10-CM | POA: Diagnosis not present

## 2015-08-12 DIAGNOSIS — E872 Acidosis: Secondary | ICD-10-CM | POA: Diagnosis not present

## 2015-08-12 DIAGNOSIS — G894 Chronic pain syndrome: Secondary | ICD-10-CM | POA: Diagnosis not present

## 2015-08-12 DIAGNOSIS — R109 Unspecified abdominal pain: Secondary | ICD-10-CM | POA: Diagnosis not present

## 2015-08-12 DIAGNOSIS — R509 Fever, unspecified: Secondary | ICD-10-CM | POA: Diagnosis not present

## 2015-08-12 DIAGNOSIS — N390 Urinary tract infection, site not specified: Secondary | ICD-10-CM | POA: Diagnosis not present

## 2015-08-13 DIAGNOSIS — R0989 Other specified symptoms and signs involving the circulatory and respiratory systems: Secondary | ICD-10-CM | POA: Diagnosis not present

## 2015-08-13 DIAGNOSIS — R198 Other specified symptoms and signs involving the digestive system and abdomen: Secondary | ICD-10-CM | POA: Diagnosis not present

## 2015-08-13 DIAGNOSIS — N390 Urinary tract infection, site not specified: Secondary | ICD-10-CM | POA: Diagnosis not present

## 2015-08-13 DIAGNOSIS — R102 Pelvic and perineal pain: Secondary | ICD-10-CM | POA: Diagnosis not present

## 2015-08-13 DIAGNOSIS — G894 Chronic pain syndrome: Secondary | ICD-10-CM | POA: Diagnosis not present

## 2015-08-13 DIAGNOSIS — R918 Other nonspecific abnormal finding of lung field: Secondary | ICD-10-CM | POA: Diagnosis not present

## 2015-08-14 DIAGNOSIS — R102 Pelvic and perineal pain: Secondary | ICD-10-CM | POA: Diagnosis not present

## 2015-08-14 DIAGNOSIS — E872 Acidosis: Secondary | ICD-10-CM | POA: Diagnosis not present

## 2015-08-14 DIAGNOSIS — K668 Other specified disorders of peritoneum: Secondary | ICD-10-CM | POA: Diagnosis not present

## 2015-08-14 DIAGNOSIS — N99538 Other complication of other stoma of urinary tract: Secondary | ICD-10-CM | POA: Diagnosis not present

## 2015-08-15 DIAGNOSIS — J9811 Atelectasis: Secondary | ICD-10-CM | POA: Diagnosis not present

## 2015-08-15 DIAGNOSIS — R109 Unspecified abdominal pain: Secondary | ICD-10-CM | POA: Diagnosis not present

## 2015-08-15 DIAGNOSIS — K659 Peritonitis, unspecified: Secondary | ICD-10-CM | POA: Diagnosis not present

## 2015-08-15 DIAGNOSIS — F909 Attention-deficit hyperactivity disorder, unspecified type: Secondary | ICD-10-CM | POA: Diagnosis not present

## 2015-08-15 DIAGNOSIS — R000 Tachycardia, unspecified: Secondary | ICD-10-CM | POA: Diagnosis not present

## 2015-08-15 DIAGNOSIS — F329 Major depressive disorder, single episode, unspecified: Secondary | ICD-10-CM | POA: Diagnosis not present

## 2015-08-15 DIAGNOSIS — R509 Fever, unspecified: Secondary | ICD-10-CM | POA: Diagnosis not present

## 2015-08-16 DIAGNOSIS — R1084 Generalized abdominal pain: Secondary | ICD-10-CM | POA: Diagnosis not present

## 2015-08-16 DIAGNOSIS — R109 Unspecified abdominal pain: Secondary | ICD-10-CM | POA: Diagnosis not present

## 2015-08-16 DIAGNOSIS — F909 Attention-deficit hyperactivity disorder, unspecified type: Secondary | ICD-10-CM | POA: Diagnosis not present

## 2015-08-16 DIAGNOSIS — R000 Tachycardia, unspecified: Secondary | ICD-10-CM | POA: Diagnosis not present

## 2015-08-16 DIAGNOSIS — K659 Peritonitis, unspecified: Secondary | ICD-10-CM | POA: Diagnosis not present

## 2015-08-16 DIAGNOSIS — F329 Major depressive disorder, single episode, unspecified: Secondary | ICD-10-CM | POA: Diagnosis not present

## 2015-08-17 DIAGNOSIS — F329 Major depressive disorder, single episode, unspecified: Secondary | ICD-10-CM | POA: Diagnosis not present

## 2015-08-17 DIAGNOSIS — R000 Tachycardia, unspecified: Secondary | ICD-10-CM | POA: Diagnosis not present

## 2015-08-17 DIAGNOSIS — K651 Peritoneal abscess: Secondary | ICD-10-CM | POA: Diagnosis not present

## 2015-08-17 DIAGNOSIS — K659 Peritonitis, unspecified: Secondary | ICD-10-CM | POA: Diagnosis not present

## 2015-08-17 DIAGNOSIS — F909 Attention-deficit hyperactivity disorder, unspecified type: Secondary | ICD-10-CM | POA: Diagnosis not present

## 2015-08-17 DIAGNOSIS — R109 Unspecified abdominal pain: Secondary | ICD-10-CM | POA: Diagnosis not present

## 2015-08-18 DIAGNOSIS — R109 Unspecified abdominal pain: Secondary | ICD-10-CM | POA: Diagnosis not present

## 2015-08-18 DIAGNOSIS — K659 Peritonitis, unspecified: Secondary | ICD-10-CM | POA: Diagnosis not present

## 2015-08-18 DIAGNOSIS — F329 Major depressive disorder, single episode, unspecified: Secondary | ICD-10-CM | POA: Diagnosis not present

## 2015-08-18 DIAGNOSIS — F909 Attention-deficit hyperactivity disorder, unspecified type: Secondary | ICD-10-CM | POA: Diagnosis not present

## 2015-08-18 DIAGNOSIS — R000 Tachycardia, unspecified: Secondary | ICD-10-CM | POA: Diagnosis not present

## 2015-08-19 DIAGNOSIS — K630 Abscess of intestine: Secondary | ICD-10-CM | POA: Diagnosis not present

## 2015-08-19 DIAGNOSIS — K651 Peritoneal abscess: Secondary | ICD-10-CM | POA: Diagnosis not present

## 2015-08-19 DIAGNOSIS — G8918 Other acute postprocedural pain: Secondary | ICD-10-CM | POA: Diagnosis not present

## 2015-08-24 DIAGNOSIS — T8189XA Other complications of procedures, not elsewhere classified, initial encounter: Secondary | ICD-10-CM | POA: Diagnosis not present

## 2015-08-25 DIAGNOSIS — R102 Pelvic and perineal pain: Secondary | ICD-10-CM | POA: Diagnosis not present

## 2015-08-25 DIAGNOSIS — G894 Chronic pain syndrome: Secondary | ICD-10-CM | POA: Diagnosis not present

## 2015-08-25 DIAGNOSIS — R198 Other specified symptoms and signs involving the digestive system and abdomen: Secondary | ICD-10-CM | POA: Diagnosis not present

## 2015-08-25 DIAGNOSIS — T83198A Other mechanical complication of other urinary devices and implants, initial encounter: Secondary | ICD-10-CM | POA: Diagnosis not present

## 2015-08-25 DIAGNOSIS — N390 Urinary tract infection, site not specified: Secondary | ICD-10-CM | POA: Diagnosis not present

## 2015-08-25 DIAGNOSIS — R339 Retention of urine, unspecified: Secondary | ICD-10-CM | POA: Diagnosis not present

## 2015-08-29 DIAGNOSIS — T83198A Other mechanical complication of other urinary devices and implants, initial encounter: Secondary | ICD-10-CM | POA: Diagnosis not present

## 2015-08-31 DIAGNOSIS — M791 Myalgia: Secondary | ICD-10-CM | POA: Diagnosis not present

## 2015-08-31 DIAGNOSIS — Z452 Encounter for adjustment and management of vascular access device: Secondary | ICD-10-CM | POA: Diagnosis not present

## 2015-08-31 DIAGNOSIS — T83198A Other mechanical complication of other urinary devices and implants, initial encounter: Secondary | ICD-10-CM | POA: Diagnosis not present

## 2015-08-31 DIAGNOSIS — M797 Fibromyalgia: Secondary | ICD-10-CM | POA: Diagnosis not present

## 2015-08-31 DIAGNOSIS — A419 Sepsis, unspecified organism: Secondary | ICD-10-CM | POA: Diagnosis not present

## 2015-08-31 DIAGNOSIS — G894 Chronic pain syndrome: Secondary | ICD-10-CM | POA: Diagnosis not present

## 2015-08-31 DIAGNOSIS — K659 Peritonitis, unspecified: Secondary | ICD-10-CM | POA: Diagnosis not present

## 2015-08-31 DIAGNOSIS — E039 Hypothyroidism, unspecified: Secondary | ICD-10-CM | POA: Diagnosis not present

## 2015-08-31 DIAGNOSIS — D638 Anemia in other chronic diseases classified elsewhere: Secondary | ICD-10-CM | POA: Diagnosis not present

## 2015-09-02 DIAGNOSIS — A419 Sepsis, unspecified organism: Secondary | ICD-10-CM | POA: Diagnosis not present

## 2015-09-02 DIAGNOSIS — M797 Fibromyalgia: Secondary | ICD-10-CM | POA: Diagnosis not present

## 2015-09-02 DIAGNOSIS — D638 Anemia in other chronic diseases classified elsewhere: Secondary | ICD-10-CM | POA: Diagnosis not present

## 2015-09-02 DIAGNOSIS — Z452 Encounter for adjustment and management of vascular access device: Secondary | ICD-10-CM | POA: Diagnosis not present

## 2015-09-02 DIAGNOSIS — T83198A Other mechanical complication of other urinary devices and implants, initial encounter: Secondary | ICD-10-CM | POA: Diagnosis not present

## 2015-09-02 DIAGNOSIS — M791 Myalgia: Secondary | ICD-10-CM | POA: Diagnosis not present

## 2015-09-02 DIAGNOSIS — E039 Hypothyroidism, unspecified: Secondary | ICD-10-CM | POA: Diagnosis not present

## 2015-09-02 DIAGNOSIS — G894 Chronic pain syndrome: Secondary | ICD-10-CM | POA: Diagnosis not present

## 2015-09-02 DIAGNOSIS — K659 Peritonitis, unspecified: Secondary | ICD-10-CM | POA: Diagnosis not present

## 2015-09-05 DIAGNOSIS — T83198A Other mechanical complication of other urinary devices and implants, initial encounter: Secondary | ICD-10-CM | POA: Diagnosis not present

## 2015-09-07 DIAGNOSIS — Z452 Encounter for adjustment and management of vascular access device: Secondary | ICD-10-CM | POA: Diagnosis not present

## 2015-09-07 DIAGNOSIS — E039 Hypothyroidism, unspecified: Secondary | ICD-10-CM | POA: Diagnosis not present

## 2015-09-07 DIAGNOSIS — D638 Anemia in other chronic diseases classified elsewhere: Secondary | ICD-10-CM | POA: Diagnosis not present

## 2015-09-07 DIAGNOSIS — G894 Chronic pain syndrome: Secondary | ICD-10-CM | POA: Diagnosis not present

## 2015-09-07 DIAGNOSIS — A419 Sepsis, unspecified organism: Secondary | ICD-10-CM | POA: Diagnosis not present

## 2015-09-07 DIAGNOSIS — M791 Myalgia: Secondary | ICD-10-CM | POA: Diagnosis not present

## 2015-09-07 DIAGNOSIS — K659 Peritonitis, unspecified: Secondary | ICD-10-CM | POA: Diagnosis not present

## 2015-09-07 DIAGNOSIS — T83198A Other mechanical complication of other urinary devices and implants, initial encounter: Secondary | ICD-10-CM | POA: Diagnosis not present

## 2015-09-07 DIAGNOSIS — M797 Fibromyalgia: Secondary | ICD-10-CM | POA: Diagnosis not present

## 2015-09-09 DIAGNOSIS — D638 Anemia in other chronic diseases classified elsewhere: Secondary | ICD-10-CM | POA: Diagnosis not present

## 2015-09-09 DIAGNOSIS — G894 Chronic pain syndrome: Secondary | ICD-10-CM | POA: Diagnosis not present

## 2015-09-09 DIAGNOSIS — E039 Hypothyroidism, unspecified: Secondary | ICD-10-CM | POA: Diagnosis not present

## 2015-09-09 DIAGNOSIS — A419 Sepsis, unspecified organism: Secondary | ICD-10-CM | POA: Diagnosis not present

## 2015-09-09 DIAGNOSIS — T83198A Other mechanical complication of other urinary devices and implants, initial encounter: Secondary | ICD-10-CM | POA: Diagnosis not present

## 2015-09-09 DIAGNOSIS — M797 Fibromyalgia: Secondary | ICD-10-CM | POA: Diagnosis not present

## 2015-09-09 DIAGNOSIS — K659 Peritonitis, unspecified: Secondary | ICD-10-CM | POA: Diagnosis not present

## 2015-09-09 DIAGNOSIS — M791 Myalgia: Secondary | ICD-10-CM | POA: Diagnosis not present

## 2015-09-09 DIAGNOSIS — Z452 Encounter for adjustment and management of vascular access device: Secondary | ICD-10-CM | POA: Diagnosis not present

## 2015-09-12 DIAGNOSIS — Z452 Encounter for adjustment and management of vascular access device: Secondary | ICD-10-CM | POA: Diagnosis not present

## 2015-09-12 DIAGNOSIS — A419 Sepsis, unspecified organism: Secondary | ICD-10-CM | POA: Diagnosis not present

## 2015-09-12 DIAGNOSIS — M797 Fibromyalgia: Secondary | ICD-10-CM | POA: Diagnosis not present

## 2015-09-12 DIAGNOSIS — M791 Myalgia: Secondary | ICD-10-CM | POA: Diagnosis not present

## 2015-09-12 DIAGNOSIS — G894 Chronic pain syndrome: Secondary | ICD-10-CM | POA: Diagnosis not present

## 2015-09-12 DIAGNOSIS — T83198A Other mechanical complication of other urinary devices and implants, initial encounter: Secondary | ICD-10-CM | POA: Diagnosis not present

## 2015-09-12 DIAGNOSIS — K659 Peritonitis, unspecified: Secondary | ICD-10-CM | POA: Diagnosis not present

## 2015-09-12 DIAGNOSIS — E039 Hypothyroidism, unspecified: Secondary | ICD-10-CM | POA: Diagnosis not present

## 2015-09-12 DIAGNOSIS — D638 Anemia in other chronic diseases classified elsewhere: Secondary | ICD-10-CM | POA: Diagnosis not present

## 2015-09-14 DIAGNOSIS — K659 Peritonitis, unspecified: Secondary | ICD-10-CM | POA: Diagnosis not present

## 2015-09-14 DIAGNOSIS — M797 Fibromyalgia: Secondary | ICD-10-CM | POA: Diagnosis not present

## 2015-09-14 DIAGNOSIS — Z452 Encounter for adjustment and management of vascular access device: Secondary | ICD-10-CM | POA: Diagnosis not present

## 2015-09-14 DIAGNOSIS — A419 Sepsis, unspecified organism: Secondary | ICD-10-CM | POA: Diagnosis not present

## 2015-09-14 DIAGNOSIS — E039 Hypothyroidism, unspecified: Secondary | ICD-10-CM | POA: Diagnosis not present

## 2015-09-14 DIAGNOSIS — G894 Chronic pain syndrome: Secondary | ICD-10-CM | POA: Diagnosis not present

## 2015-09-14 DIAGNOSIS — M791 Myalgia: Secondary | ICD-10-CM | POA: Diagnosis not present

## 2015-09-14 DIAGNOSIS — T83198A Other mechanical complication of other urinary devices and implants, initial encounter: Secondary | ICD-10-CM | POA: Diagnosis not present

## 2015-09-14 DIAGNOSIS — D638 Anemia in other chronic diseases classified elsewhere: Secondary | ICD-10-CM | POA: Diagnosis not present

## 2015-09-16 DIAGNOSIS — T83198A Other mechanical complication of other urinary devices and implants, initial encounter: Secondary | ICD-10-CM | POA: Diagnosis not present

## 2015-09-16 DIAGNOSIS — G894 Chronic pain syndrome: Secondary | ICD-10-CM | POA: Diagnosis not present

## 2015-09-16 DIAGNOSIS — M797 Fibromyalgia: Secondary | ICD-10-CM | POA: Diagnosis not present

## 2015-09-16 DIAGNOSIS — E039 Hypothyroidism, unspecified: Secondary | ICD-10-CM | POA: Diagnosis not present

## 2015-09-16 DIAGNOSIS — A419 Sepsis, unspecified organism: Secondary | ICD-10-CM | POA: Diagnosis not present

## 2015-09-16 DIAGNOSIS — M791 Myalgia: Secondary | ICD-10-CM | POA: Diagnosis not present

## 2015-09-16 DIAGNOSIS — Z452 Encounter for adjustment and management of vascular access device: Secondary | ICD-10-CM | POA: Diagnosis not present

## 2015-09-16 DIAGNOSIS — K659 Peritonitis, unspecified: Secondary | ICD-10-CM | POA: Diagnosis not present

## 2015-09-16 DIAGNOSIS — D638 Anemia in other chronic diseases classified elsewhere: Secondary | ICD-10-CM | POA: Diagnosis not present

## 2015-09-19 DIAGNOSIS — K659 Peritonitis, unspecified: Secondary | ICD-10-CM | POA: Diagnosis not present

## 2015-09-19 DIAGNOSIS — T8189XA Other complications of procedures, not elsewhere classified, initial encounter: Secondary | ICD-10-CM | POA: Diagnosis not present

## 2015-09-19 DIAGNOSIS — G894 Chronic pain syndrome: Secondary | ICD-10-CM | POA: Diagnosis not present

## 2015-09-19 DIAGNOSIS — E039 Hypothyroidism, unspecified: Secondary | ICD-10-CM | POA: Diagnosis not present

## 2015-09-19 DIAGNOSIS — M797 Fibromyalgia: Secondary | ICD-10-CM | POA: Diagnosis not present

## 2015-09-19 DIAGNOSIS — D638 Anemia in other chronic diseases classified elsewhere: Secondary | ICD-10-CM | POA: Diagnosis not present

## 2015-09-19 DIAGNOSIS — Z452 Encounter for adjustment and management of vascular access device: Secondary | ICD-10-CM | POA: Diagnosis not present

## 2015-09-19 DIAGNOSIS — M791 Myalgia: Secondary | ICD-10-CM | POA: Diagnosis not present

## 2015-09-19 DIAGNOSIS — T83198A Other mechanical complication of other urinary devices and implants, initial encounter: Secondary | ICD-10-CM | POA: Diagnosis not present

## 2015-09-19 DIAGNOSIS — A419 Sepsis, unspecified organism: Secondary | ICD-10-CM | POA: Diagnosis not present

## 2015-09-21 DIAGNOSIS — A419 Sepsis, unspecified organism: Secondary | ICD-10-CM | POA: Diagnosis not present

## 2015-09-21 DIAGNOSIS — K659 Peritonitis, unspecified: Secondary | ICD-10-CM | POA: Diagnosis not present

## 2015-09-21 DIAGNOSIS — Z452 Encounter for adjustment and management of vascular access device: Secondary | ICD-10-CM | POA: Diagnosis not present

## 2015-09-21 DIAGNOSIS — M791 Myalgia: Secondary | ICD-10-CM | POA: Diagnosis not present

## 2015-09-21 DIAGNOSIS — M797 Fibromyalgia: Secondary | ICD-10-CM | POA: Diagnosis not present

## 2015-09-21 DIAGNOSIS — G894 Chronic pain syndrome: Secondary | ICD-10-CM | POA: Diagnosis not present

## 2015-09-21 DIAGNOSIS — T83198A Other mechanical complication of other urinary devices and implants, initial encounter: Secondary | ICD-10-CM | POA: Diagnosis not present

## 2015-09-21 DIAGNOSIS — D638 Anemia in other chronic diseases classified elsewhere: Secondary | ICD-10-CM | POA: Diagnosis not present

## 2015-09-21 DIAGNOSIS — E039 Hypothyroidism, unspecified: Secondary | ICD-10-CM | POA: Diagnosis not present

## 2015-09-22 DIAGNOSIS — M797 Fibromyalgia: Secondary | ICD-10-CM | POA: Diagnosis not present

## 2015-09-22 DIAGNOSIS — D638 Anemia in other chronic diseases classified elsewhere: Secondary | ICD-10-CM | POA: Diagnosis not present

## 2015-09-22 DIAGNOSIS — G894 Chronic pain syndrome: Secondary | ICD-10-CM | POA: Diagnosis not present

## 2015-09-22 DIAGNOSIS — Z452 Encounter for adjustment and management of vascular access device: Secondary | ICD-10-CM | POA: Diagnosis not present

## 2015-09-22 DIAGNOSIS — A419 Sepsis, unspecified organism: Secondary | ICD-10-CM | POA: Diagnosis not present

## 2015-09-22 DIAGNOSIS — K659 Peritonitis, unspecified: Secondary | ICD-10-CM | POA: Diagnosis not present

## 2015-09-22 DIAGNOSIS — M791 Myalgia: Secondary | ICD-10-CM | POA: Diagnosis not present

## 2015-09-22 DIAGNOSIS — T83198A Other mechanical complication of other urinary devices and implants, initial encounter: Secondary | ICD-10-CM | POA: Diagnosis not present

## 2015-09-22 DIAGNOSIS — E039 Hypothyroidism, unspecified: Secondary | ICD-10-CM | POA: Diagnosis not present

## 2015-09-23 DIAGNOSIS — K659 Peritonitis, unspecified: Secondary | ICD-10-CM | POA: Diagnosis not present

## 2015-09-23 DIAGNOSIS — Z452 Encounter for adjustment and management of vascular access device: Secondary | ICD-10-CM | POA: Diagnosis not present

## 2015-09-23 DIAGNOSIS — T8189XA Other complications of procedures, not elsewhere classified, initial encounter: Secondary | ICD-10-CM | POA: Diagnosis not present

## 2015-09-23 DIAGNOSIS — E039 Hypothyroidism, unspecified: Secondary | ICD-10-CM | POA: Diagnosis not present

## 2015-09-23 DIAGNOSIS — G894 Chronic pain syndrome: Secondary | ICD-10-CM | POA: Diagnosis not present

## 2015-09-23 DIAGNOSIS — A419 Sepsis, unspecified organism: Secondary | ICD-10-CM | POA: Diagnosis not present

## 2015-09-23 DIAGNOSIS — M797 Fibromyalgia: Secondary | ICD-10-CM | POA: Diagnosis not present

## 2015-09-23 DIAGNOSIS — T83198A Other mechanical complication of other urinary devices and implants, initial encounter: Secondary | ICD-10-CM | POA: Diagnosis not present

## 2015-09-23 DIAGNOSIS — D638 Anemia in other chronic diseases classified elsewhere: Secondary | ICD-10-CM | POA: Diagnosis not present

## 2015-09-23 DIAGNOSIS — M791 Myalgia: Secondary | ICD-10-CM | POA: Diagnosis not present

## 2015-09-26 DIAGNOSIS — R339 Retention of urine, unspecified: Secondary | ICD-10-CM | POA: Diagnosis not present

## 2015-09-26 DIAGNOSIS — T83198A Other mechanical complication of other urinary devices and implants, initial encounter: Secondary | ICD-10-CM | POA: Diagnosis not present

## 2015-09-28 DIAGNOSIS — D638 Anemia in other chronic diseases classified elsewhere: Secondary | ICD-10-CM | POA: Diagnosis not present

## 2015-09-28 DIAGNOSIS — K659 Peritonitis, unspecified: Secondary | ICD-10-CM | POA: Diagnosis not present

## 2015-09-28 DIAGNOSIS — M791 Myalgia: Secondary | ICD-10-CM | POA: Diagnosis not present

## 2015-09-28 DIAGNOSIS — A419 Sepsis, unspecified organism: Secondary | ICD-10-CM | POA: Diagnosis not present

## 2015-09-28 DIAGNOSIS — T83198A Other mechanical complication of other urinary devices and implants, initial encounter: Secondary | ICD-10-CM | POA: Diagnosis not present

## 2015-09-28 DIAGNOSIS — Z452 Encounter for adjustment and management of vascular access device: Secondary | ICD-10-CM | POA: Diagnosis not present

## 2015-09-28 DIAGNOSIS — G894 Chronic pain syndrome: Secondary | ICD-10-CM | POA: Diagnosis not present

## 2015-09-28 DIAGNOSIS — E039 Hypothyroidism, unspecified: Secondary | ICD-10-CM | POA: Diagnosis not present

## 2015-09-28 DIAGNOSIS — M797 Fibromyalgia: Secondary | ICD-10-CM | POA: Diagnosis not present

## 2015-09-30 DIAGNOSIS — M791 Myalgia: Secondary | ICD-10-CM | POA: Diagnosis not present

## 2015-09-30 DIAGNOSIS — K659 Peritonitis, unspecified: Secondary | ICD-10-CM | POA: Diagnosis not present

## 2015-09-30 DIAGNOSIS — E039 Hypothyroidism, unspecified: Secondary | ICD-10-CM | POA: Diagnosis not present

## 2015-09-30 DIAGNOSIS — A419 Sepsis, unspecified organism: Secondary | ICD-10-CM | POA: Diagnosis not present

## 2015-09-30 DIAGNOSIS — G894 Chronic pain syndrome: Secondary | ICD-10-CM | POA: Diagnosis not present

## 2015-09-30 DIAGNOSIS — D638 Anemia in other chronic diseases classified elsewhere: Secondary | ICD-10-CM | POA: Diagnosis not present

## 2015-09-30 DIAGNOSIS — Z452 Encounter for adjustment and management of vascular access device: Secondary | ICD-10-CM | POA: Diagnosis not present

## 2015-09-30 DIAGNOSIS — T83198A Other mechanical complication of other urinary devices and implants, initial encounter: Secondary | ICD-10-CM | POA: Diagnosis not present

## 2015-09-30 DIAGNOSIS — M797 Fibromyalgia: Secondary | ICD-10-CM | POA: Diagnosis not present

## 2015-10-03 DIAGNOSIS — R102 Pelvic and perineal pain: Secondary | ICD-10-CM | POA: Diagnosis not present

## 2015-10-03 DIAGNOSIS — M791 Myalgia: Secondary | ICD-10-CM | POA: Diagnosis not present

## 2015-10-03 DIAGNOSIS — M25511 Pain in right shoulder: Secondary | ICD-10-CM | POA: Diagnosis not present

## 2015-10-03 DIAGNOSIS — G894 Chronic pain syndrome: Secondary | ICD-10-CM | POA: Diagnosis not present

## 2015-10-05 DIAGNOSIS — M797 Fibromyalgia: Secondary | ICD-10-CM | POA: Diagnosis not present

## 2015-10-05 DIAGNOSIS — E039 Hypothyroidism, unspecified: Secondary | ICD-10-CM | POA: Diagnosis not present

## 2015-10-05 DIAGNOSIS — K659 Peritonitis, unspecified: Secondary | ICD-10-CM | POA: Diagnosis not present

## 2015-10-05 DIAGNOSIS — G894 Chronic pain syndrome: Secondary | ICD-10-CM | POA: Diagnosis not present

## 2015-10-05 DIAGNOSIS — A419 Sepsis, unspecified organism: Secondary | ICD-10-CM | POA: Diagnosis not present

## 2015-10-05 DIAGNOSIS — Z452 Encounter for adjustment and management of vascular access device: Secondary | ICD-10-CM | POA: Diagnosis not present

## 2015-10-05 DIAGNOSIS — M791 Myalgia: Secondary | ICD-10-CM | POA: Diagnosis not present

## 2015-10-05 DIAGNOSIS — T83198A Other mechanical complication of other urinary devices and implants, initial encounter: Secondary | ICD-10-CM | POA: Diagnosis not present

## 2015-10-05 DIAGNOSIS — D638 Anemia in other chronic diseases classified elsewhere: Secondary | ICD-10-CM | POA: Diagnosis not present

## 2015-10-06 DIAGNOSIS — T83198A Other mechanical complication of other urinary devices and implants, initial encounter: Secondary | ICD-10-CM | POA: Diagnosis not present

## 2015-10-12 DIAGNOSIS — E039 Hypothyroidism, unspecified: Secondary | ICD-10-CM | POA: Diagnosis not present

## 2015-10-12 DIAGNOSIS — M797 Fibromyalgia: Secondary | ICD-10-CM | POA: Diagnosis not present

## 2015-10-12 DIAGNOSIS — K659 Peritonitis, unspecified: Secondary | ICD-10-CM | POA: Diagnosis not present

## 2015-10-12 DIAGNOSIS — T83198A Other mechanical complication of other urinary devices and implants, initial encounter: Secondary | ICD-10-CM | POA: Diagnosis not present

## 2015-10-12 DIAGNOSIS — G894 Chronic pain syndrome: Secondary | ICD-10-CM | POA: Diagnosis not present

## 2015-10-12 DIAGNOSIS — M791 Myalgia: Secondary | ICD-10-CM | POA: Diagnosis not present

## 2015-10-12 DIAGNOSIS — Z452 Encounter for adjustment and management of vascular access device: Secondary | ICD-10-CM | POA: Diagnosis not present

## 2015-10-12 DIAGNOSIS — A419 Sepsis, unspecified organism: Secondary | ICD-10-CM | POA: Diagnosis not present

## 2015-10-12 DIAGNOSIS — D638 Anemia in other chronic diseases classified elsewhere: Secondary | ICD-10-CM | POA: Diagnosis not present

## 2015-10-19 DIAGNOSIS — A419 Sepsis, unspecified organism: Secondary | ICD-10-CM | POA: Diagnosis not present

## 2015-10-19 DIAGNOSIS — Z452 Encounter for adjustment and management of vascular access device: Secondary | ICD-10-CM | POA: Diagnosis not present

## 2015-10-19 DIAGNOSIS — D638 Anemia in other chronic diseases classified elsewhere: Secondary | ICD-10-CM | POA: Diagnosis not present

## 2015-10-19 DIAGNOSIS — M791 Myalgia: Secondary | ICD-10-CM | POA: Diagnosis not present

## 2015-10-19 DIAGNOSIS — T83198A Other mechanical complication of other urinary devices and implants, initial encounter: Secondary | ICD-10-CM | POA: Diagnosis not present

## 2015-10-19 DIAGNOSIS — K659 Peritonitis, unspecified: Secondary | ICD-10-CM | POA: Diagnosis not present

## 2015-10-19 DIAGNOSIS — G894 Chronic pain syndrome: Secondary | ICD-10-CM | POA: Diagnosis not present

## 2015-10-19 DIAGNOSIS — M797 Fibromyalgia: Secondary | ICD-10-CM | POA: Diagnosis not present

## 2015-10-19 DIAGNOSIS — E039 Hypothyroidism, unspecified: Secondary | ICD-10-CM | POA: Diagnosis not present

## 2015-10-25 DIAGNOSIS — R339 Retention of urine, unspecified: Secondary | ICD-10-CM | POA: Diagnosis not present

## 2015-11-02 DIAGNOSIS — Z79899 Other long term (current) drug therapy: Secondary | ICD-10-CM | POA: Diagnosis not present

## 2015-11-02 DIAGNOSIS — R102 Pelvic and perineal pain: Secondary | ICD-10-CM | POA: Diagnosis not present

## 2015-11-02 DIAGNOSIS — R109 Unspecified abdominal pain: Secondary | ICD-10-CM | POA: Diagnosis not present

## 2015-11-02 DIAGNOSIS — G894 Chronic pain syndrome: Secondary | ICD-10-CM | POA: Diagnosis not present

## 2015-11-02 DIAGNOSIS — M545 Low back pain: Secondary | ICD-10-CM | POA: Diagnosis not present

## 2015-11-02 DIAGNOSIS — Z5181 Encounter for therapeutic drug level monitoring: Secondary | ICD-10-CM | POA: Diagnosis not present

## 2015-11-15 DIAGNOSIS — R339 Retention of urine, unspecified: Secondary | ICD-10-CM | POA: Diagnosis not present

## 2015-11-22 DIAGNOSIS — N390 Urinary tract infection, site not specified: Secondary | ICD-10-CM | POA: Diagnosis not present

## 2015-11-22 DIAGNOSIS — Z9889 Other specified postprocedural states: Secondary | ICD-10-CM | POA: Diagnosis not present

## 2015-11-23 DIAGNOSIS — R339 Retention of urine, unspecified: Secondary | ICD-10-CM | POA: Diagnosis not present

## 2015-12-13 DIAGNOSIS — R339 Retention of urine, unspecified: Secondary | ICD-10-CM | POA: Diagnosis not present

## 2015-12-15 DIAGNOSIS — F3174 Bipolar disorder, in full remission, most recent episode manic: Secondary | ICD-10-CM | POA: Diagnosis not present

## 2015-12-15 DIAGNOSIS — F3132 Bipolar disorder, current episode depressed, moderate: Secondary | ICD-10-CM | POA: Diagnosis not present

## 2015-12-15 DIAGNOSIS — F900 Attention-deficit hyperactivity disorder, predominantly inattentive type: Secondary | ICD-10-CM | POA: Diagnosis not present

## 2015-12-20 DIAGNOSIS — R102 Pelvic and perineal pain: Secondary | ICD-10-CM | POA: Diagnosis not present

## 2015-12-20 DIAGNOSIS — E559 Vitamin D deficiency, unspecified: Secondary | ICD-10-CM | POA: Diagnosis not present

## 2015-12-20 DIAGNOSIS — E038 Other specified hypothyroidism: Secondary | ICD-10-CM | POA: Diagnosis not present

## 2015-12-20 DIAGNOSIS — G8929 Other chronic pain: Secondary | ICD-10-CM | POA: Diagnosis not present

## 2015-12-20 DIAGNOSIS — Z0000 Encounter for general adult medical examination without abnormal findings: Secondary | ICD-10-CM | POA: Diagnosis not present

## 2015-12-20 DIAGNOSIS — E119 Type 2 diabetes mellitus without complications: Secondary | ICD-10-CM | POA: Diagnosis not present

## 2015-12-20 DIAGNOSIS — G894 Chronic pain syndrome: Secondary | ICD-10-CM | POA: Diagnosis not present

## 2015-12-20 DIAGNOSIS — M545 Low back pain: Secondary | ICD-10-CM | POA: Diagnosis not present

## 2016-01-12 DIAGNOSIS — R339 Retention of urine, unspecified: Secondary | ICD-10-CM | POA: Diagnosis not present

## 2016-01-17 DIAGNOSIS — E038 Other specified hypothyroidism: Secondary | ICD-10-CM | POA: Diagnosis not present

## 2016-01-17 DIAGNOSIS — Z0000 Encounter for general adult medical examination without abnormal findings: Secondary | ICD-10-CM | POA: Diagnosis not present

## 2016-01-17 DIAGNOSIS — Z1389 Encounter for screening for other disorder: Secondary | ICD-10-CM | POA: Diagnosis not present

## 2016-01-17 DIAGNOSIS — E119 Type 2 diabetes mellitus without complications: Secondary | ICD-10-CM | POA: Diagnosis not present

## 2016-01-17 DIAGNOSIS — E781 Pure hyperglyceridemia: Secondary | ICD-10-CM | POA: Diagnosis not present

## 2016-01-17 DIAGNOSIS — F329 Major depressive disorder, single episode, unspecified: Secondary | ICD-10-CM | POA: Diagnosis not present

## 2016-01-17 DIAGNOSIS — Z23 Encounter for immunization: Secondary | ICD-10-CM | POA: Diagnosis not present

## 2016-01-24 DIAGNOSIS — F3111 Bipolar disorder, current episode manic without psychotic features, mild: Secondary | ICD-10-CM | POA: Diagnosis not present

## 2016-01-24 DIAGNOSIS — F3131 Bipolar disorder, current episode depressed, mild: Secondary | ICD-10-CM | POA: Diagnosis not present

## 2016-01-24 DIAGNOSIS — F900 Attention-deficit hyperactivity disorder, predominantly inattentive type: Secondary | ICD-10-CM | POA: Diagnosis not present

## 2016-01-24 DIAGNOSIS — F410 Panic disorder [episodic paroxysmal anxiety] without agoraphobia: Secondary | ICD-10-CM | POA: Diagnosis not present

## 2016-02-02 DIAGNOSIS — Z5181 Encounter for therapeutic drug level monitoring: Secondary | ICD-10-CM | POA: Diagnosis not present

## 2016-02-02 DIAGNOSIS — G894 Chronic pain syndrome: Secondary | ICD-10-CM | POA: Diagnosis not present

## 2016-02-02 DIAGNOSIS — Z79899 Other long term (current) drug therapy: Secondary | ICD-10-CM | POA: Diagnosis not present

## 2016-02-02 DIAGNOSIS — G8929 Other chronic pain: Secondary | ICD-10-CM | POA: Diagnosis not present

## 2016-02-02 DIAGNOSIS — R102 Pelvic and perineal pain: Secondary | ICD-10-CM | POA: Diagnosis not present

## 2016-02-10 DIAGNOSIS — R339 Retention of urine, unspecified: Secondary | ICD-10-CM | POA: Diagnosis not present

## 2016-03-19 DIAGNOSIS — F3176 Bipolar disorder, in full remission, most recent episode depressed: Secondary | ICD-10-CM | POA: Diagnosis not present

## 2016-03-19 DIAGNOSIS — F3174 Bipolar disorder, in full remission, most recent episode manic: Secondary | ICD-10-CM | POA: Diagnosis not present

## 2016-03-28 DIAGNOSIS — R102 Pelvic and perineal pain: Secondary | ICD-10-CM | POA: Diagnosis not present

## 2016-03-28 DIAGNOSIS — M545 Low back pain: Secondary | ICD-10-CM | POA: Diagnosis not present

## 2016-03-28 DIAGNOSIS — G894 Chronic pain syndrome: Secondary | ICD-10-CM | POA: Diagnosis not present

## 2016-03-28 DIAGNOSIS — G8929 Other chronic pain: Secondary | ICD-10-CM | POA: Diagnosis not present

## 2016-03-28 DIAGNOSIS — M791 Myalgia: Secondary | ICD-10-CM | POA: Diagnosis not present

## 2016-04-19 DIAGNOSIS — M797 Fibromyalgia: Secondary | ICD-10-CM | POA: Diagnosis not present

## 2016-04-19 DIAGNOSIS — T8131XA Disruption of external operation (surgical) wound, not elsewhere classified, initial encounter: Secondary | ICD-10-CM | POA: Diagnosis not present

## 2016-05-12 ENCOUNTER — Emergency Department (HOSPITAL_COMMUNITY)
Admission: EM | Admit: 2016-05-12 | Discharge: 2016-05-12 | Disposition: A | Discharge status: Other Acute Inpatient Hospital | Payer: BLUE CROSS/BLUE SHIELD | Attending: Emergency Medicine | Admitting: Emergency Medicine

## 2016-05-12 ENCOUNTER — Emergency Department (HOSPITAL_COMMUNITY): Payer: BLUE CROSS/BLUE SHIELD

## 2016-05-12 ENCOUNTER — Encounter (HOSPITAL_COMMUNITY): Payer: Self-pay | Admitting: Emergency Medicine

## 2016-05-12 DIAGNOSIS — Z79899 Other long term (current) drug therapy: Secondary | ICD-10-CM | POA: Insufficient documentation

## 2016-05-12 DIAGNOSIS — N390 Urinary tract infection, site not specified: Secondary | ICD-10-CM

## 2016-05-12 DIAGNOSIS — R188 Other ascites: Secondary | ICD-10-CM

## 2016-05-12 DIAGNOSIS — R1031 Right lower quadrant pain: Secondary | ICD-10-CM | POA: Diagnosis present

## 2016-05-12 DIAGNOSIS — A419 Sepsis, unspecified organism: Secondary | ICD-10-CM

## 2016-05-12 DIAGNOSIS — R509 Fever, unspecified: Secondary | ICD-10-CM | POA: Diagnosis not present

## 2016-05-12 DIAGNOSIS — E039 Hypothyroidism, unspecified: Secondary | ICD-10-CM | POA: Diagnosis not present

## 2016-05-12 DIAGNOSIS — F909 Attention-deficit hyperactivity disorder, unspecified type: Secondary | ICD-10-CM | POA: Diagnosis not present

## 2016-05-12 DIAGNOSIS — R1909 Other intra-abdominal and pelvic swelling, mass and lump: Secondary | ICD-10-CM | POA: Insufficient documentation

## 2016-05-12 DIAGNOSIS — D72829 Elevated white blood cell count, unspecified: Secondary | ICD-10-CM | POA: Diagnosis not present

## 2016-05-12 LAB — COMPREHENSIVE METABOLIC PANEL
ALBUMIN: 4.4 g/dL (ref 3.5–5.0)
ALT: 16 U/L (ref 14–54)
ANION GAP: 10 (ref 5–15)
AST: 17 U/L (ref 15–41)
Alkaline Phosphatase: 38 U/L (ref 38–126)
BILIRUBIN TOTAL: 0.7 mg/dL (ref 0.3–1.2)
BUN: 20 mg/dL (ref 6–20)
CO2: 21 mmol/L — ABNORMAL LOW (ref 22–32)
Calcium: 9 mg/dL (ref 8.9–10.3)
Chloride: 104 mmol/L (ref 101–111)
Creatinine, Ser: 1.21 mg/dL — ABNORMAL HIGH (ref 0.44–1.00)
GFR calc Af Amer: >60 mL/min (ref >60)
GFR calc non Af Amer: 57 mL/min — ABNORMAL LOW (ref >60)
GLUCOSE: 124 mg/dL — AB (ref 65–99)
POTASSIUM: 3.1 mmol/L — AB (ref 3.5–5.1)
Sodium: 135 mmol/L (ref 135–145)
TOTAL PROTEIN: 7.9 g/dL (ref 6.5–8.1)

## 2016-05-12 LAB — CBC
HEMATOCRIT: 41.2 % (ref 36.0–46.0)
HEMOGLOBIN: 13.7 g/dL (ref 12.0–15.0)
MCH: 27.6 pg (ref 26.0–34.0)
MCHC: 33.3 g/dL (ref 30.0–36.0)
MCV: 82.9 fL (ref 78.0–100.0)
Platelets: 376 10*3/uL (ref 150–400)
RBC: 4.97 MIL/uL (ref 3.87–5.11)
RDW: 13.3 % (ref 11.5–15.5)
WBC: 19.9 10*3/uL — AB (ref 4.0–10.5)

## 2016-05-12 LAB — URINALYSIS, ROUTINE W REFLEX MICROSCOPIC
BILIRUBIN URINE: NEGATIVE
Glucose, UA: NEGATIVE mg/dL
Ketones, ur: NEGATIVE mg/dL
NITRITE: NEGATIVE
Protein, ur: 100 mg/dL — AB
Specific Gravity, Urine: 1.009 (ref 1.005–1.030)
Squamous Epithelial / LPF: NONE SEEN
pH: 7 (ref 5.0–8.0)

## 2016-05-12 LAB — LIPASE, BLOOD: Lipase: 23 U/L (ref 11–51)

## 2016-05-12 MED ORDER — POTASSIUM CHLORIDE CRYS ER 20 MEQ PO TBCR
40.0000 meq | EXTENDED_RELEASE_TABLET | Freq: Once | ORAL | Status: AC
  Administered 2016-05-12: 40 meq via ORAL
  Filled 2016-05-12: qty 2

## 2016-05-12 MED ORDER — CIPROFLOXACIN IN D5W 400 MG/200ML IV SOLN
400.0000 mg | Freq: Once | INTRAVENOUS | Status: AC
  Administered 2016-05-12: 400 mg via INTRAVENOUS
  Filled 2016-05-12: qty 200

## 2016-05-12 MED ORDER — SODIUM CHLORIDE 0.9 % IV BOLUS (SEPSIS)
1000.0000 mL | Freq: Once | INTRAVENOUS | Status: AC
  Administered 2016-05-12: 1000 mL via INTRAVENOUS

## 2016-05-12 MED ORDER — METRONIDAZOLE IN NACL 5-0.79 MG/ML-% IV SOLN
500.0000 mg | Freq: Once | INTRAVENOUS | Status: AC
  Administered 2016-05-12: 500 mg via INTRAVENOUS
  Filled 2016-05-12: qty 100

## 2016-05-12 MED ORDER — HYDROMORPHONE HCL 1 MG/ML IJ SOLN
1.0000 mg | Freq: Once | INTRAMUSCULAR | Status: AC
  Administered 2016-05-12: 1 mg via INTRAVENOUS
  Filled 2016-05-12: qty 1

## 2016-05-12 MED ORDER — HYDROMORPHONE HCL 1 MG/ML IJ SOLN
2.0000 mg | Freq: Once | INTRAMUSCULAR | Status: AC
  Administered 2016-05-12: 2 mg via INTRAVENOUS
  Filled 2016-05-12: qty 2

## 2016-05-12 MED ORDER — IOPAMIDOL (ISOVUE-300) INJECTION 61%
30.0000 mL | Freq: Once | INTRAVENOUS | Status: AC | PRN
  Administered 2016-05-12: 30 mL via ORAL

## 2016-05-12 MED ORDER — ONDANSETRON HCL 4 MG/2ML IJ SOLN
4.0000 mg | Freq: Once | INTRAMUSCULAR | Status: AC
  Administered 2016-05-12: 4 mg via INTRAVENOUS
  Filled 2016-05-12: qty 2

## 2016-05-12 MED ORDER — IOPAMIDOL (ISOVUE-300) INJECTION 61%
100.0000 mL | Freq: Once | INTRAVENOUS | Status: AC | PRN
  Administered 2016-05-12: 100 mL via INTRAVENOUS

## 2016-05-12 NOTE — ED Provider Notes (Signed)
[facility] DEPT Provider Note   CSN: [id number] Arrival date & time: 05/12/16  1408     History   Chief Complaint Chief Complaint  Patient presents with  . Abdominal Pain    HPI [name] is a 36 y.o. female.   Abdominal Pain   This is a new problem. The current episode started 12 to 24 hours ago. The problem occurs constantly. The problem has not changed since onset.The pain is associated with an unknown factor. The pain is located in the RLQ. The quality of the pain is sharp. The pain is mild. Associated symptoms include anorexia, fever and nausea. Pertinent negatives include melena, vomiting, constipation, dysuria and frequency. The symptoms are aggravated by certain positions. The symptoms are relieved by certain positions.    Past Medical History:  Diagnosis Date  . ADHD (attention deficit hyperactivity disorder)   . Anxiety   . Bipolar disorder ([place])   . Chronic back pain   . Chronic headaches   . Chronic interstitial cystitis    "removed my bladder when I went to end stage IC"  . Depression   . Fibromyalgia   . History of blood transfusion 02/2010   "related to OR"  . HLD (hyperlipidemia)   . Hypothyroid   . Self-catheterizes urinary bladder    "I have an [place] pouch; made a bladder using part of my small intestines; cath out of stoma in my belly button" (08/02/2015)  . Small bowel obstruction 08/03/2015  . Thyroid disease     Patient Active Problem List   Diagnosis Date Noted  . SBO (small bowel obstruction) 08/03/2015  . Hypothyroidism 08/03/2015  . ADHD (attention deficit hyperactivity disorder) 08/03/2015  . Depression 08/03/2015  . Fibromyalgia 08/03/2015  . Intra-abdominal abscess [facility])     Past Surgical History:  Procedure Laterality Date  . ABDOMINAL HYSTERECTOMY  02/2010   "left my ovaries"  . APPENDECTOMY  02/2010  . BLADDER REMOVAL  02/2010   and [place]  . CESAREAN SECTION  2009  . SPINAL CORD STIMULATOR IMPLANT   09/2009; ~ 04/2015  . SPINAL CORD STIMULATOR REMOVAL  ~ 04/2015    OB History    No data available       Home Medications    Prior to Admission medications   Medication Sig Start Date End Date Taking? Authorizing Provider  amphetamine-dextroamphetamine (ADDERALL) 30 MG tablet Take 30 mg by mouth daily. 08/20/14  Yes Historical Provider, MD  ARIPiprazole (ABILIFY) 10 MG tablet Take 10 mg by mouth daily.  04/18/16  Yes Historical Provider, MD  Buprenorphine HCl (BELBUCA) 300 MCG FILM Place 300 mcg inside cheek 2 (two) times daily.   Yes Historical Provider, MD  busPIRone (BUSPAR) 15 MG tablet Take 15 mg by mouth 2 (two) times daily.  04/14/16  Yes Historical Provider, MD  cetirizine (ZYRTEC) 10 MG tablet Take 10 mg by mouth daily.   Yes Historical Provider, MD  clonazePAM (KLONOPIN) 1 MG tablet Take 0.5 tablets by mouth at bedtime.  08/20/14  Yes Historical Provider, MD  fenofibrate (TRICOR) 48 MG tablet Take 48 mg by mouth daily.   Yes Historical Provider, MD  ibuprofen (ADVIL,MOTRIN) 200 MG tablet Take 800 mg by mouth every 6 (six) hours as needed for mild pain.    Yes Historical Provider, MD  lamoTRIgine (LAMICTAL) 200 MG tablet Take 200 mg by mouth daily.  04/18/16  Yes Historical Provider, MD  levothyroxine (SYNTHROID, LEVOTHROID) 137 MCG tablet Take 137 mcg by mouth daily.  12/07/14  Yes Historical Provider, MD  Multiple Vitamin (MULTIVITAMIN WITH MINERALS) TABS tablet Take 1 tablet by mouth daily.   Yes Historical Provider, MD  tiZANidine (ZANAFLEX) 4 MG capsule Take 4 mg by mouth at bedtime. 06/30/15  Yes Historical Provider, MD  topiramate (TOPAMAX) 100 MG tablet Take 100 mg by mouth 2 (two) times daily. 05/19/15  Yes Historical Provider, MD  traZODone (DESYREL) 50 MG tablet Take 100 mg by mouth at bedtime.  08/30/14  Yes Historical Provider, MD  triamterene-hydrochlorothiazide (MAXZIDE) 75-50 MG tablet Take 1 tablet by mouth daily.  04/18/16  Yes Historical Provider, MD  Vitamin D,  Ergocalciferol, (DRISDOL) 50000 units CAPS capsule Take 50,000 Units by mouth 2 (two) times a week. Sundays, Tuesdays and Wednesdays 08/18/14  Yes Historical Provider, MD    Family History History reviewed. No pertinent family history.  Social History Social History  Substance Use Topics  . Smoking status: Never Smoker  . Smokeless tobacco: Never Used  . Alcohol use Yes     Comment: 08/03/2015 "glass of wine maybe twice/month"     Allergies   Keflex [cephalexin]; Penicillins; and Sulfa antibiotics   Review of Systems Review of Systems  Constitutional: Positive for fever.  Gastrointestinal: Positive for abdominal pain, anorexia and nausea. Negative for constipation, melena and vomiting.  Genitourinary: Negative for dysuria and frequency.  All other systems reviewed and are negative.    Physical Exam Updated Vital Signs BP 109/76 (BP Location: Left Arm)   Pulse 95   Temp 99.6 F (37.6 C) (Oral)   Resp 18   SpO2 99%   Physical Exam  Constitutional: She is oriented to person, place, and time. She appears well-developed and well-nourished.  HENT:  Head: Normocephalic and atraumatic.  Eyes: Conjunctivae and EOM are normal.  Neck: Normal range of motion.  Cardiovascular: Normal rate and regular rhythm.   Pulmonary/Chest: Effort normal. No stridor. No respiratory distress.  Abdominal: Soft. She exhibits no distension. There is tenderness. There is rebound and guarding.  Musculoskeletal: Normal range of motion. She exhibits no edema or deformity.  Neurological: She is alert and oriented to person, place, and time.  Skin: Skin is warm and dry.  Nursing note and vitals reviewed.    ED Treatments / Results  Labs (all labs ordered are listed, but only abnormal results are displayed) Labs Reviewed  COMPREHENSIVE METABOLIC PANEL - Abnormal; Notable for the following:       Result Value   Potassium 3.1 (*)    CO2 21 (*)    Glucose, Bld 124 (*)    Creatinine, Ser 1.21 (*)     GFR calc non Af Amer 57 (*)    All other components within normal limits  CBC - Abnormal; Notable for the following:    WBC 19.9 (*)    All other components within normal limits  URINALYSIS, ROUTINE W REFLEX MICROSCOPIC - Abnormal; Notable for the following:    Color, Urine YELLOW (*)    APPearance HAZY (*)    Hgb urine dipstick SMALL (*)    Protein, ur 100 (*)    Leukocytes, UA MODERATE (*)    Bacteria, UA MANY (*)    All other components within normal limits  LIPASE, BLOOD    EKG  EKG Interpretation None       Radiology Ct Abdomen Pelvis W Contrast  Addendum Date: 05/12/2016   ADDENDUM REPORT: 05/12/2016 19:07 ADDENDUM: For the sake of clarity, the right lower quadrant fluid collection measuring up to  5.4 cm on today's study and compared back to a collection of 7.2 cm on the previous study represents a different collection than the one that was reported on the prior exam at 1.5 x 1.9 cm. The 1.5 x 1.9 cm rounded hypodensity described on the prior study has resolved completely in the interval. For comparison sake today, I measured the fluid collection on today's exam and then went back and carefully remeasured it at the same levels and in the same axes on the prior studies, with direct comparative measurements of 5.0 x 4.0 x 5.4 cm today versus 6.1 x 5.5 x 7.2 cm on the prior study. Electronically Signed   By: Misty Stanley M.D.   On: 05/12/2016 19:07   Result Date: 05/12/2016 CLINICAL DATA:  Right lower quadrant abdominal pain EXAM: CT ABDOMEN AND PELVIS WITH CONTRAST TECHNIQUE: Multidetector CT imaging of the abdomen and pelvis was performed using the standard protocol following bolus administration of intravenous contrast. CONTRAST:  141mL ISOVUE-300 IOPAMIDOL (ISOVUE-300) INJECTION 61%, 76mL ISOVUE-300 IOPAMIDOL (ISOVUE-300) INJECTION 61% COMPARISON:  08/03/2015 FINDINGS: Lower chest:  Unremarkable Hepatobiliary: No focal abnormality within the liver parenchyma. There is no  evidence for gallstones, gallbladder wall thickening, or pericholecystic fluid. No intrahepatic or extrahepatic biliary dilation. Pancreas: No focal mass lesion. No dilatation of the main duct. No intraparenchymal cyst. No peripancreatic edema. Spleen: No splenomegaly. No focal mass lesion. Adrenals/Urinary Tract: No adrenal nodule or mass. Horseshoe kidney again identified with slight fullness of the intrarenal collecting system of each moiety. No evidence for hydroureter. Patient is status post cystectomy with [place]. Along the inferior margin of the pouch is a rim enhancing fluid collection measuring 5.0 x 4.0 x 5.4 cm. This probably represents the same collection identified on the previous study from 10 months ago measuring 6.1 x 5.5 x 7.2 cm. There is some persistent edema/ inflammation in the right lower quadrant on today's study. Stomach/Bowel: Stomach is nondistended. No gastric wall thickening. No evidence of outlet obstruction. Duodenum is normally positioned as is the ligament of Treitz. No small bowel wall thickening. No small bowel dilatation. Cecal isolation for [place] pouch formation. Ileocolic anastomosis unremarkable. Remaining segments of the colon are unremarkable. Vascular/Lymphatic: No abdominal aortic aneurysm. Portal vein and superior mesenteric vein are patent. There is no gastrohepatic or hepatoduodenal ligament lymphadenopathy. No intraperitoneal or retroperitoneal lymphadenopathy. Insert no pelvic sidewall lymphadenopathy. Reproductive: Uterus is surgically absent. There is no adnexal mass. Other: Small volume intraperitoneal free fluid noted in the cul-de-sac. Musculoskeletal: Bone windows reveal no worrisome lytic or sclerotic osseous lesions. Thoracic spinal stimulator device incompletely visualized. IMPRESSION: 1. Similar to the study from 10 months ago, the patient has edema/inflammatory change in the mesentery of the right lower quadrant, in the region of the urinary  pouch. Associated with these changes and immediately caudal to the [place] pouch, a 5.0 x 4.0 x 5.4 cm rim enhancing fluid collection is identified. This does not appear to represent a bowel loop and was present previously although it has decreased slightly in size in the interval. Given the chronicity, this may represent a postoperative seroma/hematoma or potential urinoma. There is no gas or thick wall associated with this lesion, but superinfection of this collection cannot be excluded by imaging. Electronically Signed: By: Misty Stanley M.D. On: 05/12/2016 18:14    Procedures Procedures (including critical care time)  CRITICAL CARE Performed by: Merrily Pew Total critical care time: 35 minutes Critical care time was exclusive of separately billable procedures and treating  other patients. Critical care was necessary to treat or prevent imminent or life-threatening deterioration. Critical care was time spent personally by me on the following activities: development of treatment plan with patient and/or surrogate as well as nursing, discussions with consultants, evaluation of patient's response to treatment, examination of patient, obtaining history from patient or surrogate, ordering and performing treatments and interventions, ordering and review of laboratory studies, ordering and review of radiographic studies, pulse oximetry and re-evaluation of patient's condition.   Medications Ordered in ED Medications  ciprofloxacin (CIPRO) IVPB 400 mg (not administered)  metroNIDAZOLE (FLAGYL) IVPB 500 mg (not administered)  HYDROmorphone (DILAUDID) injection 1 mg (1 mg Intravenous Given 05/12/16 1626)  ondansetron (ZOFRAN) injection 4 mg (4 mg Intravenous Given 05/12/16 1626)  sodium chloride 0.9 % bolus 1,000 mL (0 mLs Intravenous Stopped 05/12/16 1711)  potassium chloride SA (K-DUR,KLOR-CON) CR tablet 40 mEq (40 mEq Oral Given 05/12/16 1711)  iopamidol (ISOVUE-300) 61 % injection 100 mL (100 mLs  Intravenous Contrast Given 05/12/16 1723)  iopamidol (ISOVUE-300) 61 % injection 30 mL (30 mLs Oral Contrast Given 05/12/16 1726)  HYDROmorphone (DILAUDID) injection 1 mg (1 mg Intravenous Given 05/12/16 1811)  HYDROmorphone (DILAUDID) injection 2 mg (2 mg Intravenous Given 05/12/16 1957)     Initial Impression / Assessment and Plan / ED Course  I have reviewed the triage vital signs and the nursing notes.  Pertinent labs & imaging results that were available during my care of the patient were reviewed by me and considered in my medical decision making (see chart for details).    Long abdominal surgical history so we'll evaluate for abscess, obstruction, colitis, enteritis pyelonephritis, kidney stone with CT scan. If These are absolute negative and pain is not controlled with consider ultrasound looking ovaries however think this is less likely. CT scan with a fluid collection in similar position as it was before when she had a bladder perforation. This in combination with her fever at home, elevated white blood cell count and significant right lower quadrant abdominal pain with guarding/rebound concerns me for possible abscess versus another urinary perforation. I discussed the case with radiology and the urologist on call over at [facility] (her primary urologist is Dr. Amalia Hailey from there). The urologist on-call, Dr. Livia Snellen, suggested given broad-spectrum antibiotics and transferred her there for further evaluation and management. She also stated that this has to be a new fluid collection that she had an exploratory laparoscopy done at [facility] back in June which should've taking care of the fluid collection that was there in May. The patient has allergy to sulfa sports and penicillin so is given Cipro Flagyl after consultation with the pharmacist. Still slightly tachycardic but blood pressure normal. Her vital signs also normal. Will transfer to [facility] ED.   Final Clinical  Impressions(s) / ED Diagnoses   Final diagnoses:  Lower urinary tract infectious disease  Pelvic fluid collection  Leukocytosis, unspecified type  Fever, unspecified fever cause      Merrily Pew, MD 05/12/16 2023

## 2016-05-12 NOTE — ED Notes (Signed)
Pt ambulatory to restroom

## 2016-05-12 NOTE — ED Triage Notes (Addendum)
Pt c/o constant sharp well-localized RLQ abdominal pain onset last night at 1900, nausea. No emesis or diarrhea. Worse with certain movements, ambulation. Still has ovaries, unsure if still has appendix, chart says 2011 appendectomy. Marked rebound tenderness. Pain worsened with flexion and internal rotation of right hip.

## 2016-05-15 DIAGNOSIS — Z9889 Other specified postprocedural states: Secondary | ICD-10-CM | POA: Insufficient documentation

## 2016-09-17 DIAGNOSIS — G8929 Other chronic pain: Secondary | ICD-10-CM | POA: Diagnosis not present

## 2016-09-17 DIAGNOSIS — R102 Pelvic and perineal pain: Secondary | ICD-10-CM | POA: Diagnosis not present

## 2016-09-17 DIAGNOSIS — Z5181 Encounter for therapeutic drug level monitoring: Secondary | ICD-10-CM | POA: Diagnosis not present

## 2016-09-17 DIAGNOSIS — G894 Chronic pain syndrome: Secondary | ICD-10-CM | POA: Diagnosis not present

## 2016-09-17 DIAGNOSIS — Z79899 Other long term (current) drug therapy: Secondary | ICD-10-CM | POA: Diagnosis not present

## 2016-09-17 DIAGNOSIS — M545 Low back pain: Secondary | ICD-10-CM | POA: Diagnosis not present

## 2016-10-30 DIAGNOSIS — Z6832 Body mass index (BMI) 32.0-32.9, adult: Secondary | ICD-10-CM | POA: Diagnosis not present

## 2016-10-30 DIAGNOSIS — R102 Pelvic and perineal pain: Secondary | ICD-10-CM | POA: Diagnosis not present

## 2016-11-01 DIAGNOSIS — R102 Pelvic and perineal pain: Secondary | ICD-10-CM | POA: Diagnosis not present

## 2016-11-01 DIAGNOSIS — G894 Chronic pain syndrome: Secondary | ICD-10-CM | POA: Diagnosis not present

## 2016-11-01 DIAGNOSIS — G8929 Other chronic pain: Secondary | ICD-10-CM | POA: Diagnosis not present

## 2016-11-01 DIAGNOSIS — M25511 Pain in right shoulder: Secondary | ICD-10-CM | POA: Diagnosis not present

## 2016-11-01 DIAGNOSIS — M545 Low back pain: Secondary | ICD-10-CM | POA: Diagnosis not present

## 2016-11-19 DIAGNOSIS — G8929 Other chronic pain: Secondary | ICD-10-CM | POA: Diagnosis not present

## 2016-11-19 DIAGNOSIS — R102 Pelvic and perineal pain: Secondary | ICD-10-CM | POA: Diagnosis not present

## 2016-11-19 DIAGNOSIS — G894 Chronic pain syndrome: Secondary | ICD-10-CM | POA: Diagnosis not present

## 2016-11-19 DIAGNOSIS — M545 Low back pain: Secondary | ICD-10-CM | POA: Diagnosis not present

## 2016-11-19 DIAGNOSIS — M25511 Pain in right shoulder: Secondary | ICD-10-CM | POA: Diagnosis not present

## 2016-11-26 DIAGNOSIS — R339 Retention of urine, unspecified: Secondary | ICD-10-CM | POA: Diagnosis not present

## 2016-12-04 ENCOUNTER — Encounter: Payer: Self-pay | Admitting: Nurse Practitioner

## 2016-12-04 ENCOUNTER — Other Ambulatory Visit: Payer: Self-pay | Admitting: Internal Medicine

## 2016-12-04 DIAGNOSIS — R112 Nausea with vomiting, unspecified: Secondary | ICD-10-CM

## 2016-12-04 DIAGNOSIS — R109 Unspecified abdominal pain: Secondary | ICD-10-CM | POA: Diagnosis not present

## 2016-12-04 DIAGNOSIS — G8929 Other chronic pain: Secondary | ICD-10-CM | POA: Diagnosis not present

## 2016-12-04 DIAGNOSIS — R1084 Generalized abdominal pain: Secondary | ICD-10-CM

## 2016-12-04 DIAGNOSIS — E119 Type 2 diabetes mellitus without complications: Secondary | ICD-10-CM | POA: Diagnosis not present

## 2016-12-07 ENCOUNTER — Ambulatory Visit
Admission: RE | Admit: 2016-12-07 | Discharge: 2016-12-07 | Disposition: A | Discharge status: Home / Self Care | Payer: BLUE CROSS/BLUE SHIELD | Source: Ambulatory Visit | Attending: Internal Medicine | Admitting: Internal Medicine

## 2016-12-07 DIAGNOSIS — R1084 Generalized abdominal pain: Secondary | ICD-10-CM

## 2016-12-07 DIAGNOSIS — R112 Nausea with vomiting, unspecified: Secondary | ICD-10-CM

## 2016-12-07 MED ORDER — IOPAMIDOL (ISOVUE-300) INJECTION 61%
100.0000 mL | Freq: Once | INTRAVENOUS | Status: AC | PRN
  Administered 2016-12-07: 100 mL via INTRAVENOUS

## 2016-12-10 DIAGNOSIS — G8929 Other chronic pain: Secondary | ICD-10-CM | POA: Diagnosis not present

## 2016-12-10 DIAGNOSIS — Z79899 Other long term (current) drug therapy: Secondary | ICD-10-CM | POA: Diagnosis not present

## 2016-12-10 DIAGNOSIS — G894 Chronic pain syndrome: Secondary | ICD-10-CM | POA: Diagnosis not present

## 2016-12-10 DIAGNOSIS — Z5181 Encounter for therapeutic drug level monitoring: Secondary | ICD-10-CM | POA: Diagnosis not present

## 2016-12-10 DIAGNOSIS — M25511 Pain in right shoulder: Secondary | ICD-10-CM | POA: Diagnosis not present

## 2016-12-10 DIAGNOSIS — R102 Pelvic and perineal pain: Secondary | ICD-10-CM | POA: Diagnosis not present

## 2016-12-10 DIAGNOSIS — M545 Low back pain: Secondary | ICD-10-CM | POA: Diagnosis not present

## 2016-12-13 ENCOUNTER — Ambulatory Visit (INDEPENDENT_AMBULATORY_CARE_PROVIDER_SITE_OTHER): Payer: BLUE CROSS/BLUE SHIELD | Admitting: Nurse Practitioner

## 2016-12-13 ENCOUNTER — Encounter: Payer: Self-pay | Admitting: Nurse Practitioner

## 2016-12-13 VITALS — BP 100/70 | HR 80 | Ht 66.0 in | Wt 209.8 lb

## 2016-12-13 DIAGNOSIS — R1013 Epigastric pain: Secondary | ICD-10-CM | POA: Diagnosis not present

## 2016-12-13 DIAGNOSIS — R112 Nausea with vomiting, unspecified: Secondary | ICD-10-CM | POA: Diagnosis not present

## 2016-12-13 NOTE — Patient Instructions (Signed)
If you are age 37 or older, your body mass index should be between 23-30. Your Body mass index is 33.86 kg/m. If this is out of the aforementioned range listed, please consider follow up with your Primary Care Provider.  If you are age 44 or younger, your body mass index should be between 19-25. Your Body mass index is 33.86 kg/m. If this is out of the aformentioned range listed, please consider follow up with your Primary Care Provider.   You have been scheduled for an endoscopy. Please follow written instructions given to you at your visit today. If you use inhalers (even only as needed), please bring them with you on the day of your procedure. Your physician has requested that you go to www.startemmi.com and enter the access code given to you at your visit today. This web site gives a general overview about your procedure. However, you should still follow specific instructions given to you by our office regarding your preparation for the procedure.  Continue  Zofran.  Thank you for choosing me and [facility].   Tye Savoy, NP

## 2016-12-13 NOTE — Progress Notes (Addendum)
Labs from [facility]  dated 10-18. Liver chemistries normal. Renal function normal. CBC normal  HPI: Patient is a 36 year old female with a complex medical history . She has bipolar disorder, chronic abdominal pain, pelvic pain , fibromyalgia, hx of IC s/p total cystectomy with [place] pouch. She is s/p hysterectomy. She is on chronic narcotics, has had bilateral celiac plexus blocks in June 2016 and has a neurostimulator in place. Last year she was admitted to [facility] for sepsis from pouch perforation due to infrequent catherization. She had an abdominal wash out and small bowel resection at the time  She is followed by Urology and Pain Management at [facility]   [name] is referred by PCP Janus Molder, FNP for evaluation of nausea and vomiting. She gives a 3 month history of mid upper abdominal burning which is nearly constant. She has frequent nausea throughout the day and vomits every morning before breakfast or after am medications. Emesis is watery or bilious. She's had no associated weight loss. She has not started any new medications recently. She does take 4 Motrin TID but only about one day out of the week. Her bowel movements are normal. She has tried a few different PPIs for weeks at a time with no improvement in symptoms. Zofran is the only thing that helps.   Past Medical History:  Diagnosis Date  . ADHD (attention deficit hyperactivity disorder)   . Anxiety   . Bipolar disorder ([place])   . Chronic back pain   . Chronic headaches   . Chronic interstitial cystitis    "removed my bladder when I went to end stage IC"  . Depression   . Fibromyalgia   . History of blood transfusion 02/2010   "related to OR"  . HLD (hyperlipidemia)   . Hypothyroid   . Self-catheterizes urinary bladder    "I have an [place] pouch; made a bladder using part of my small intestines; cath out of stoma in my belly button" (08/02/2015)  . Small bowel obstruction ([place]) 08/03/2015  . Thyroid disease       Past Surgical History:  Procedure Laterality Date  . ABDOMINAL HYSTERECTOMY  02/2010   "left my ovaries"  . APPENDECTOMY  02/2010  . BLADDER REMOVAL  02/2010   and [place]  . CESAREAN SECTION  2009  . SMALL INTESTINE SURGERY  2017   for bowel obstruction  . SPINAL CORD STIMULATOR IMPLANT  09/2009; ~ 04/2015  . SPINAL CORD STIMULATOR REMOVAL  ~ 04/2015  . WISDOM TOOTH EXTRACTION     Family History  Problem Relation Age of Onset  . Lung cancer Mother   . Diabetes Paternal Grandmother   . Heart disease Paternal Grandmother   . Liver cancer Paternal Grandfather   . Diabetes Paternal Grandfather   . Heart disease Paternal Grandfather   . Breast cancer Maternal Aunt   . Colon cancer Neg Hx   . Esophageal cancer Neg Hx    Social History  Substance Use Topics  . Smoking status: Never Smoker  . Smokeless tobacco: Never Used  . Alcohol use Yes     Comment: 08/03/2015 "glass of wine maybe twice/month"   Current Outpatient Prescriptions  Medication Sig Dispense Refill  . amphetamine-dextroamphetamine (ADDERALL) 30 MG tablet Take 30 mg by mouth daily.    . ARIPiprazole (ABILIFY) 10 MG tablet Take 5 mg by mouth daily.     . busPIRone (BUSPAR) 15 MG tablet Take 15 mg by mouth 2 (two) times daily.     Marland Kitchen  cetirizine (ZYRTEC) 10 MG tablet Take 10 mg by mouth daily.    . fenofibrate (TRICOR) 48 MG tablet Take 48 mg by mouth daily.    Marland Kitchen ibuprofen (ADVIL,MOTRIN) 200 MG tablet Take 800 mg by mouth every 6 (six) hours as needed for mild pain.     Marland Kitchen lamoTRIgine (LAMICTAL) 200 MG tablet Take 200 mg by mouth daily.     Marland Kitchen levothyroxine (SYNTHROID, LEVOTHROID) 137 MCG tablet Take 137 mcg by mouth daily.    . Morphine-Naltrexone (EMBEDA) 30-1.2 MG CPCR Take 1 tablet by mouth 3 (three) times daily.    . Multiple Vitamin (MULTIVITAMIN WITH MINERALS) TABS tablet Take 1 tablet by mouth daily.    . nitrofurantoin (MACRODANTIN) 50 MG capsule Take 50 mg by mouth at bedtime.    . ondansetron  (ZOFRAN) 4 MG tablet Take 4 mg by mouth every morning.    Marland Kitchen tiZANidine (ZANAFLEX) 4 MG capsule Take 4 mg by mouth at bedtime.    . topiramate (TOPAMAX) 100 MG tablet Take 100 mg by mouth 2 (two) times daily.    . traZODone (DESYREL) 50 MG tablet Take 100 mg by mouth at bedtime.     . triamterene-hydrochlorothiazide (MAXZIDE) 75-50 MG tablet Take 1 tablet by mouth daily.     . Vitamin D, Ergocalciferol, (DRISDOL) 50000 units CAPS capsule Take 50,000 Units by mouth 2 (two) times a week. Sundays, Tuesdays and Wednesdays     No current facility-administered medications for this visit.    Allergies  Allergen Reactions  . Keflex [Cephalexin] Hives  . Penicillins Hives    Has patient had a PCN reaction causing immediate rash, facial/tongue/throat swelling, SOB or lightheadedness with hypotension: Yes Has patient had a PCN reaction causing severe rash involving mucus membranes or skin necrosis: No Has patient had a PCN reaction that required hospitalization No Has patient had a PCN reaction occurring within the last 10 years: No If all of the above answers are "NO", then may proceed with Cephalosporin use.   . Sulfa Antibiotics Hives     Review of Systems: All systems reviewed and negative except where noted in HPI.    Physical Exam: BP 100/70   Pulse 80   Ht 5\' 6"  (1.676 m)   Wt 209 lb 12.8 oz (95.2 kg)   BMI 33.86 kg/m  Constitutional:  Well-developed, white female in no acute distress. Psychiatric: Normal mood and affect. Behavior is normal. EENT: Pupils normal.  Conjunctivae are normal. No scleral icterus. Neck supple.  Cardiovascular: Normal rate, regular rhythm. No edema Pulmonary/chest: Effort normal and breath sounds normal. No wheezing, rales or rhonchi. Abdominal: Soft, nondistended. Nontender. Bowel sounds active throughout. There are no masses palpable. No hepatomegaly. There is a small urinary stoma Lymphadenopathy: No cervical adenopathy noted. Neurological: Alert and  oriented to person place and time. Skin: Skin is warm and dry. No rashes noted.   ASSESSMENT AND PLAN: 36 yo female with three month hx of upper abdominal pain, nausea / vomiting (predominantly in am).. Complex medical surgical hx. Additional she is on numerous medications with potential to cause nausea, especially Macrobid and opioids. Managed by Pain Clinic.It should be taken into account however that none of her medications are new. Additionally she does take NSAIDS.   -Probably low yield but will schedule for EGD for further evaluation. The risks and benefits of EGD were discussed and the patient agrees to proceed.   Tye Savoy, NP  12/13/2016, 10:56 AM  Cc: Janus Molder, FNP Prince Solian, MD  Agree with assessment and plan as outlined.  Wilmington Cellar, MD [facility] Pager [phone]

## 2016-12-24 DIAGNOSIS — R339 Retention of urine, unspecified: Secondary | ICD-10-CM | POA: Diagnosis not present

## 2016-12-25 ENCOUNTER — Telehealth: Payer: Self-pay | Admitting: Nurse Practitioner

## 2016-12-26 ENCOUNTER — Other Ambulatory Visit: Payer: Self-pay

## 2016-12-26 MED ORDER — ONDANSETRON HCL 4 MG PO TABS: 4.0000 mg | ORAL_TABLET | ORAL | 0 refills | Status: DC

## 2017-01-15 ENCOUNTER — Ambulatory Visit (AMBULATORY_SURGERY_CENTER): Payer: BLUE CROSS/BLUE SHIELD | Admitting: Gastroenterology

## 2017-01-15 ENCOUNTER — Encounter: Payer: Self-pay | Admitting: Gastroenterology

## 2017-01-15 VITALS — BP 120/75 | HR 67 | Temp 98.4°F | Resp 14 | Ht 66.0 in | Wt 209.0 lb

## 2017-01-15 DIAGNOSIS — R1013 Epigastric pain: Secondary | ICD-10-CM

## 2017-01-15 DIAGNOSIS — K317 Polyp of stomach and duodenum: Secondary | ICD-10-CM | POA: Diagnosis not present

## 2017-01-15 DIAGNOSIS — R112 Nausea with vomiting, unspecified: Secondary | ICD-10-CM | POA: Diagnosis not present

## 2017-01-15 DIAGNOSIS — K2970 Gastritis, unspecified, without bleeding: Secondary | ICD-10-CM

## 2017-01-15 DIAGNOSIS — K2990 Gastroduodenitis, unspecified, without bleeding: Secondary | ICD-10-CM

## 2017-01-15 MED ORDER — SODIUM CHLORIDE 0.9 % IV SOLN
4.0000 mg | Freq: Once | INTRAVENOUS | Status: AC
Start: 2017-01-15 — End: 2017-01-15
  Administered 2017-01-15: 4 mg via INTRAVENOUS

## 2017-01-15 MED ORDER — SODIUM CHLORIDE 0.9 % IV SOLN: 500.0000 mL | INTRAVENOUS | Status: DC

## 2017-01-15 MED ORDER — PANTOPRAZOLE SODIUM 40 MG PO TBEC: 40.0000 mg | DELAYED_RELEASE_TABLET | Freq: Two times a day (BID) | ORAL | 3 refills | Status: DC

## 2017-01-15 MED ORDER — ONDANSETRON HCL 4 MG PO TABS: 4.0000 mg | ORAL_TABLET | Freq: Three times a day (TID) | ORAL | 1 refills | Status: DC | PRN

## 2017-01-15 NOTE — Progress Notes (Signed)
Called to room to assist during endoscopic procedure.  Patient ID and intended procedure confirmed with present staff. Received instructions for my participation in the procedure from the performing physician.  

## 2017-01-15 NOTE — Progress Notes (Signed)
TO PACU  Pt awake and alert. Report to RN 

## 2017-01-15 NOTE — Progress Notes (Signed)
Pt has a spinal cord stimulator in her lower left back area. maw

## 2017-01-15 NOTE — Op Note (Signed)
[place] Patient Name: Star Resler Procedure Date: 01/15/2017 10:26 AM MRN: [id number] Endoscopist: Remo Lipps P. Armbruster MD, MD Age: 36 Referring MD:  Date of Birth: 02/15/1981 Gender: Female Account #: [id number] Procedure:                Upper GI endoscopy Indications:              Epigastric abdominal pain, Nausea with vomiting, no                            improvement with OTC antacids (Zegerid, Prilosec                            low dose) Medicines:                Monitored Anesthesia Care Procedure:                Pre-Anesthesia Assessment:                           - Prior to the procedure, a History and Physical                            was performed, and patient medications and                            allergies were reviewed. The patient's tolerance of                            previous anesthesia was also reviewed. The risks                            and benefits of the procedure and the sedation                            options and risks were discussed with the patient.                            All questions were answered, and informed consent                            was obtained. Prior Anticoagulants: The patient has                            taken no previous anticoagulant or antiplatelet                            agents. ASA Grade Assessment: II - A patient with                            mild systemic disease. After reviewing the risks                            and benefits, the patient was deemed in  satisfactory condition to undergo the procedure.                           After obtaining informed consent, the endoscope was                            passed under direct vision. Throughout the                            procedure, the patient's blood pressure, pulse, and                            oxygen saturations were monitored continuously. The                            Endoscope was introduced through  the mouth, and                            advanced to the second part of duodenum. The upper                            GI endoscopy was accomplished without difficulty.                            The patient tolerated the procedure well. Scope In: Scope Out: Findings:                 Esophagogastric landmarks were identified: the                            Z-line was found at 36 cm, the gastroesophageal                            junction was found at 36 cm and the upper extent of                            the gastric folds was found at 36 cm from the                            incisors.                           LA Grade A esophagitis was found at the GEJ.                           A single nodule was found at the gastroesophageal                            junction, 36 cm from the incisors, roughly 4-30mm in                            size. I suspect this may be inflammatory in nature  due to reflux, but biopsies were taken with a cold                            forceps for histology to further evaluate.                           The exam of the esophagus was otherwise normal.                           The entire examined stomach was normal. Biopsies                            were taken from the antrum, body, and incisura with                            a cold forceps biopsy for H pylori testing.                           The duodenal bulb and second portion of the                            duodenum were normal. Complications:            No immediate complications. Estimated blood loss:                            Minimal. Estimated Blood Loss:     Estimated blood loss was minimal. Impression:               - Esophagogastric landmarks identified.                           - LA Grade A esophagitis.                           - Nodule found in the GEJ, suspect inflammatory in                            nature. Biopsied to further evaluate.                            - Normal stomach.                           - Normal duodenal bulb and second portion of the                            duodenum. Recommendation:           - Patient has a contact number available for                            emergencies. The signs and symptoms of potential                            delayed complications were discussed with the  patient. Return to normal activities tomorrow.                            Written discharge instructions were provided to the                            patient.                           - Resume previous diet.                           - Continue present medications.                           - Await pathology results.                           - Trial of high dose protonix 40mg  twice daily for                            one month given failure of OTC PPIs. Remo Lipps P. Armbruster MD, MD 01/15/2017 10:48:10 AM This report has been signed electronically.

## 2017-01-15 NOTE — Progress Notes (Signed)
Patient had post procedure nausea without vomiting. Obtained order for IV zofran form Dr. Havery Moros. Given and patient dc/d with no nausea. SM

## 2017-01-15 NOTE — Patient Instructions (Signed)
New Rx for Pantoprazole 40 mg twice a day sent to your pharmacy!  YOU HAD AN ENDOSCOPIC PROCEDURE TODAY AT [facility]:   Refer to the procedure report that was given to you for any specific questions about what was found during the examination.  If the procedure report does not answer your questions, please call your gastroenterologist to clarify.  If you requested that your care partner not be given the details of your procedure findings, then the procedure report has been included in a sealed envelope for you to review at your convenience later.  YOU SHOULD EXPECT: Some feelings of bloating in the abdomen. Passage of more gas than usual.  Walking can help get rid of the air that was put into your GI tract during the procedure and reduce the bloating. If you had a lower endoscopy (such as a colonoscopy or flexible sigmoidoscopy) you may notice spotting of blood in your stool or on the toilet paper. If you underwent a bowel prep for your procedure, you may not have a normal bowel movement for a few days.  Please Note:  You might notice some irritation and congestion in your nose or some drainage.  This is from the oxygen used during your procedure.  There is no need for concern and it should clear up in a day or so.  SYMPTOMS TO REPORT IMMEDIATELY:   Following upper endoscopy (EGD)  Vomiting of blood or coffee ground material  New chest pain or pain under the shoulder blades  Painful or persistently difficult swallowing  New shortness of breath  Fever of 100F or higher  Black, tarry-looking stools  For urgent or emergent issues, a gastroenterologist can be reached at any hour by calling [phone].   DIET:  We do recommend a small meal at first, but then you may proceed to your regular diet.  Drink plenty of fluids but you should avoid alcoholic beverages for 24 hours.  ACTIVITY:  You should plan to take it easy for the rest of today and you should NOT DRIVE or use  heavy machinery until tomorrow (because of the sedation medicines used during the test).    FOLLOW UP: Our staff will call the number listed on your records the next business day following your procedure to check on you and address any questions or concerns that you may have regarding the information given to you following your procedure. If we do not reach you, we will leave a message.  However, if you are feeling well and you are not experiencing any problems, there is no need to return our call.  We will assume that you have returned to your regular daily activities without incident.  If any biopsies were taken you will be contacted by phone or by letter within the next 1-3 weeks.  Please call us at [phone] if you have not heard about the biopsies in 3 weeks.    SIGNATURES/CONFIDENTIALITY: You and/or your care partner have signed paperwork which will be entered into your electronic medical record.  These signatures attest to the fact that that the information above on your After Visit Summary has been reviewed and is understood.  Full responsibility of the confidentiality of this discharge information lies with you and/or your care-partner.  Thank you for letting us take care of your healthcare needs today.

## 2017-01-16 ENCOUNTER — Telehealth: Payer: Self-pay | Admitting: *Deleted

## 2017-01-16 NOTE — Telephone Encounter (Signed)
  Follow up Call-  Call back number 01/15/2017  Post procedure Call Back phone  # [phone] hm  Permission to leave phone message Yes  Some recent data might be hidden     Patient questions:  Do you have a fever, pain , or abdominal swelling? No. Pain Score  0 *  Have you tolerated food without any problems? Yes.    Have you been able to return to your normal activities? Yes.    Do you have any questions about your discharge instructions: Diet   No. Medications  No. Follow up visit  No.  Do you have questions or concerns about your Care? No.  Actions: * If pain score is 4 or above: No action needed, pain <4.

## 2017-01-18 ENCOUNTER — Encounter: Payer: Self-pay | Admitting: Gastroenterology

## 2017-01-22 DIAGNOSIS — R339 Retention of urine, unspecified: Secondary | ICD-10-CM | POA: Diagnosis not present

## 2017-01-31 DIAGNOSIS — D508 Other iron deficiency anemias: Secondary | ICD-10-CM | POA: Diagnosis not present

## 2017-01-31 DIAGNOSIS — Z0000 Encounter for general adult medical examination without abnormal findings: Secondary | ICD-10-CM | POA: Diagnosis not present

## 2017-01-31 DIAGNOSIS — F3174 Bipolar disorder, in full remission, most recent episode manic: Secondary | ICD-10-CM | POA: Diagnosis not present

## 2017-01-31 DIAGNOSIS — E119 Type 2 diabetes mellitus without complications: Secondary | ICD-10-CM | POA: Diagnosis not present

## 2017-01-31 DIAGNOSIS — F3176 Bipolar disorder, in full remission, most recent episode depressed: Secondary | ICD-10-CM | POA: Diagnosis not present

## 2017-01-31 DIAGNOSIS — E038 Other specified hypothyroidism: Secondary | ICD-10-CM | POA: Diagnosis not present

## 2017-02-06 DIAGNOSIS — G894 Chronic pain syndrome: Secondary | ICD-10-CM | POA: Diagnosis not present

## 2017-02-06 DIAGNOSIS — G8929 Other chronic pain: Secondary | ICD-10-CM | POA: Diagnosis not present

## 2017-02-06 DIAGNOSIS — R102 Pelvic and perineal pain: Secondary | ICD-10-CM | POA: Diagnosis not present

## 2017-02-06 DIAGNOSIS — M25511 Pain in right shoulder: Secondary | ICD-10-CM | POA: Diagnosis not present

## 2017-02-06 DIAGNOSIS — M545 Low back pain: Secondary | ICD-10-CM | POA: Diagnosis not present

## 2017-02-07 DIAGNOSIS — Z0000 Encounter for general adult medical examination without abnormal findings: Secondary | ICD-10-CM | POA: Diagnosis not present

## 2017-02-07 DIAGNOSIS — Z23 Encounter for immunization: Secondary | ICD-10-CM | POA: Diagnosis not present

## 2017-02-07 DIAGNOSIS — Z6832 Body mass index (BMI) 32.0-32.9, adult: Secondary | ICD-10-CM | POA: Diagnosis not present

## 2017-02-07 DIAGNOSIS — M79672 Pain in left foot: Secondary | ICD-10-CM | POA: Diagnosis not present

## 2017-02-07 DIAGNOSIS — D508 Other iron deficiency anemias: Secondary | ICD-10-CM | POA: Diagnosis not present

## 2017-02-07 DIAGNOSIS — E119 Type 2 diabetes mellitus without complications: Secondary | ICD-10-CM | POA: Diagnosis not present

## 2017-02-07 DIAGNOSIS — R1084 Generalized abdominal pain: Secondary | ICD-10-CM | POA: Diagnosis not present

## 2017-02-07 DIAGNOSIS — Z1389 Encounter for screening for other disorder: Secondary | ICD-10-CM | POA: Diagnosis not present

## 2017-02-20 DIAGNOSIS — R339 Retention of urine, unspecified: Secondary | ICD-10-CM | POA: Diagnosis not present

## 2017-02-21 DIAGNOSIS — R8271 Bacteriuria: Secondary | ICD-10-CM | POA: Diagnosis not present

## 2017-02-22 ENCOUNTER — Ambulatory Visit (HOSPITAL_COMMUNITY)
Admission: RE | Admit: 2017-02-22 | Discharge: 2017-02-22 | Disposition: A | Discharge status: Home / Self Care | Payer: BLUE CROSS/BLUE SHIELD | Source: Ambulatory Visit | Attending: Internal Medicine | Admitting: Internal Medicine

## 2017-02-22 DIAGNOSIS — D649 Anemia, unspecified: Secondary | ICD-10-CM | POA: Insufficient documentation

## 2017-02-22 MED ORDER — SODIUM CHLORIDE 0.9 % IV SOLN
510.0000 mg | INTRAVENOUS | Status: DC
  Administered 2017-02-22: 510 mg via INTRAVENOUS
  Filled 2017-02-22: qty 17

## 2017-02-22 NOTE — Discharge Instructions (Signed)

## 2017-02-28 DIAGNOSIS — J020 Streptococcal pharyngitis: Secondary | ICD-10-CM | POA: Diagnosis not present

## 2017-02-28 DIAGNOSIS — J029 Acute pharyngitis, unspecified: Secondary | ICD-10-CM | POA: Diagnosis not present

## 2017-03-01 ENCOUNTER — Ambulatory Visit (HOSPITAL_COMMUNITY)
Admission: RE | Admit: 2017-03-01 | Discharge: 2017-03-01 | Disposition: A | Discharge status: Home / Self Care | Payer: BLUE CROSS/BLUE SHIELD | Source: Ambulatory Visit | Attending: Internal Medicine | Admitting: Internal Medicine

## 2017-03-01 DIAGNOSIS — D649 Anemia, unspecified: Secondary | ICD-10-CM | POA: Insufficient documentation

## 2017-03-01 MED ORDER — SODIUM CHLORIDE 0.9 % IV SOLN
510.0000 mg | INTRAVENOUS | Status: DC
  Administered 2017-03-01: 510 mg via INTRAVENOUS
  Filled 2017-03-01: qty 17

## 2017-03-12 DIAGNOSIS — M25579 Pain in unspecified ankle and joints of unspecified foot: Secondary | ICD-10-CM | POA: Diagnosis not present

## 2017-03-12 DIAGNOSIS — Z6834 Body mass index (BMI) 34.0-34.9, adult: Secondary | ICD-10-CM | POA: Diagnosis not present

## 2017-03-14 ENCOUNTER — Ambulatory Visit (INDEPENDENT_AMBULATORY_CARE_PROVIDER_SITE_OTHER): Payer: BLUE CROSS/BLUE SHIELD | Admitting: Gastroenterology

## 2017-03-14 ENCOUNTER — Encounter: Payer: Self-pay | Admitting: Gastroenterology

## 2017-03-14 VITALS — BP 116/80 | HR 88 | Ht 65.0 in | Wt 215.0 lb

## 2017-03-14 DIAGNOSIS — R1110 Vomiting, unspecified: Secondary | ICD-10-CM

## 2017-03-14 DIAGNOSIS — D509 Iron deficiency anemia, unspecified: Secondary | ICD-10-CM

## 2017-03-14 DIAGNOSIS — R1013 Epigastric pain: Secondary | ICD-10-CM

## 2017-03-14 DIAGNOSIS — R110 Nausea: Secondary | ICD-10-CM | POA: Diagnosis not present

## 2017-03-14 MED ORDER — ONDANSETRON 4 MG PO TBDP: 4.0000 mg | ORAL_TABLET | Freq: Three times a day (TID) | ORAL | 0 refills | Status: DC | PRN

## 2017-03-14 MED ORDER — AMBULATORY NON FORMULARY MEDICATION: Status: DC

## 2017-03-14 NOTE — Progress Notes (Signed)
HPI: 37 year old female here for follow-up visit. She was seen in the clinic initially by Tye Savoy in October 2018 for nausea / vomiting, epigastric pain. She underwent an EGD in November showing LA grade a esophagitis, inflammatory nodule at the GEJ, otherwise normal stomach and small bowel. Biopsies negative for H pylori. I placed her on a trial Protonix 40 mg twice a day at that time.  She reports for the past 6 months total she has been having recurrent nausea and vomiting. She states she vomits about 5 times per week which appears to be sporadic and not related to meals. Her nausea is also unpredictable and comes and goes. She denies any reflux symptoms. Protonix helps mildly when she took it, but not significantly. She does have some epigastric burning which bothers her frequently. She states sometimes food can make her symptoms worse but not routinely. She denies any weight loss. She denies any bowel habit changes. She denies any blood in her stools. She states she has a history of an anal fissure for which she had colonoscopy performed in 2015, we have no reports of that. She also endorses history of iron deficiency for which she is received IV iron. She does not have any menstrual cycles.  She had a CT scan in October 2018 for these symptoms which showed no clear cause for her symptoms.  She has a complex medical history to include bipolar disorder, chronic abdominal pain, pelvic pain , fibromyalgia, hx of IC s/p total cystectomy with [place] pouch. She is s/p hysterectomy. She is on chronic narcotics, has had bilateral celiac plexus blocks in June 2016 and has a neurostimulator in place. She has been on Embeda for the past 6 months and reports being on chronic narcotics since age 14 for chronic pain. She is taking buspirone twice daily for anxiety. She denies frequent NSAID use.   EGD 01/15/2017 - LA grade A esophagitis, inflammatory nodule at GEJ, normal esophagus otherwise, normal  stomach, normal small bowel.      Past Medical History:  Diagnosis Date  . ADHD (attention deficit hyperactivity disorder)   . Allergy   . Anxiety   . Bipolar disorder ([place])   . Chronic back pain   . Chronic headaches   . Chronic interstitial cystitis    "removed my bladder when I went to end stage IC"  . Depression   . Emphysema of lung ([place])    gestational only  . Fibromyalgia   . GERD (gastroesophageal reflux disease)   . History of blood transfusion 02/2010   "related to OR"  . HLD (hyperlipidemia)   . Hypothyroid   . Interstitial cystitis   . Self-catheterizes urinary bladder    "I have an [place] pouch; made a bladder using part of my small intestines; cath out of stoma in my belly button" (08/02/2015)  . Small bowel obstruction ([place]) 08/03/2015  . Thyroid disease      Past Surgical History:  Procedure Laterality Date  . ABDOMINAL HYSTERECTOMY  02/2010   "left my ovaries"  . APPENDECTOMY  02/2010  . BLADDER REMOVAL  02/2010   and [place]  . CESAREAN SECTION  2009  . OTHER SURGICAL HISTORY     remval of urethra when bladder was removed  . SMALL INTESTINE SURGERY  2017   for bowel obstruction  . SPINAL CORD STIMULATOR IMPLANT  09/2009; ~ 04/2015  . SPINAL CORD STIMULATOR REMOVAL  ~ 04/2015  . WISDOM TOOTH EXTRACTION     Family  History  Problem Relation Age of Onset  . Lung cancer Mother   . Diabetes Paternal Grandmother   . Heart disease Paternal Grandmother   . Liver cancer Paternal Grandfather   . Diabetes Paternal Grandfather   . Heart disease Paternal Grandfather   . Pancreatic cancer Paternal Grandfather   . Breast cancer Maternal Aunt   . Colon cancer Neg Hx   . Esophageal cancer Neg Hx   . Prostate cancer Neg Hx   . Rectal cancer Neg Hx   . Stomach cancer Neg Hx    Social History   Tobacco Use  . Smoking status: Never Smoker  . Smokeless tobacco: Never Used  Substance Use Topics  . Alcohol use: Yes    Comment: 08/03/2015 "glass of wine  maybe twice/month"  . Drug use: No   Current Outpatient Medications  Medication Sig Dispense Refill  . amphetamine-dextroamphetamine (ADDERALL) 30 MG tablet Take 30 mg by mouth daily.    . busPIRone (BUSPAR) 15 MG tablet Take 15 mg by mouth 2 (two) times daily.     . Cariprazine HCl (VRAYLAR) 4.5 MG CAPS Take 1 capsule by mouth at bedtime.    . cetirizine (ZYRTEC) 10 MG tablet Take 10 mg by mouth daily.    . fenofibrate (TRICOR) 48 MG tablet Take 48 mg by mouth daily.    Marland Kitchen ibuprofen (ADVIL,MOTRIN) 200 MG tablet Take 800 mg by mouth 3 (three) times daily.     Marland Kitchen lamoTRIgine (LAMICTAL) 200 MG tablet Take 200 mg by mouth daily.     Marland Kitchen levothyroxine (SYNTHROID, LEVOTHROID) 137 MCG tablet Take 137 mcg by mouth daily.    . Morphine-Naltrexone (EMBEDA) 30-1.2 MG CPCR Take 1 tablet by mouth 3 (three) times daily.    . Multiple Vitamin (MULTIVITAMIN WITH MINERALS) TABS tablet Take 1 tablet by mouth daily.    . nitrofurantoin (MACRODANTIN) 50 MG capsule Take 50 mg by mouth at bedtime.    . ondansetron (ZOFRAN) 4 MG tablet Take 1 tablet (4 mg total) by mouth every morning. 20 tablet 0  . ondansetron (ZOFRAN) 4 MG tablet Take 1 tablet (4 mg total) every 8 (eight) hours as needed by mouth for nausea or vomiting. 90 tablet 1  . pantoprazole (PROTONIX) 40 MG tablet Take 1 tablet (40 mg total) 2 (two) times daily by mouth. 60 tablet 3  . tiZANidine (ZANAFLEX) 4 MG capsule Take 4 mg by mouth at bedtime.    . topiramate (TOPAMAX) 100 MG tablet Take 100 mg by mouth 2 (two) times daily.    . traZODone (DESYREL) 50 MG tablet Take 100 mg by mouth at bedtime.     . triamterene-hydrochlorothiazide (MAXZIDE) 75-50 MG tablet Take 1 tablet by mouth daily.     . Vitamin D, Ergocalciferol, (DRISDOL) 50000 units CAPS capsule Take 50,000 Units by mouth 2 (two) times a week. Sundays, Tuesdays and Wednesdays    . AMBULATORY NON FORMULARY MEDICATION Medication Name: FDgard 1 to 2 tablets before meals    . ondansetron  (ZOFRAN-ODT) 4 MG disintegrating tablet Take 1 tablet (4 mg total) by mouth every 8 (eight) hours as needed for nausea or vomiting. 20 tablet 0   No current facility-administered medications for this visit.    Allergies  Allergen Reactions  . Keflex [Cephalexin] Hives  . Penicillins Hives    Has patient had a PCN reaction causing immediate rash, facial/tongue/throat swelling, SOB or lightheadedness with hypotension: Yes Has patient had a PCN reaction causing severe rash involving mucus membranes  or skin necrosis: No Has patient had a PCN reaction that required hospitalization No Has patient had a PCN reaction occurring within the last 10 years: No If all of the above answers are "NO", then may proceed with Cephalosporin use.   . Sulfa Antibiotics Hives     Review of Systems: All systems reviewed and negative except where noted in HPI.   Most recent labs per care everywhere  Physical Exam: BP 116/80   Pulse 88   Ht 5\' 5"  (1.651 m) Comment: height measured without shoes  Wt 215 lb (97.5 kg)   BMI 35.78 kg/m  Constitutional: Pleasant, female in no acute distress. HEENT: Normocephalic and atraumatic. Conjunctivae are normal. No scleral icterus. Neck supple.  Cardiovascular: Normal rate, regular rhythm.  Pulmonary/chest: Effort normal and breath sounds normal. No wheezing, rales or rhonchi. Abdominal: Soft, nondistended, nontender. There are no masses palpable. No hepatomegaly. Extremities: no edema Lymphadenopathy: No cervical adenopathy noted. Neurological: Alert and oriented to person place and time. Skin: Skin is warm and dry. No rashes noted. Psychiatric: Normal mood and affect. Behavior is normal.   ASSESSMENT AND PLAN: 37 year old female here for follow-up visit for the issues as outlined below:  Chronic nausea and vomiting / dyspepsia / chronic narcotic use - I suspect her chronic narcotic use is playing some role in these symptoms. She's been on chronic narcotics  since age 55. Interestingly her use Embeda over the past 6 months correlates with her nausea (although this would seem not as strong as other narcotics she has been on). She will talk with her pain management provider to see if there is any way to taper her off narcotics at some point if nausea continues. Will give her zofran to use for the nausea PRN, and recommend she try FD gard prior to meals to see if this helps. She is already taking buspirone. She will continue protonix for now in light of the esophagitis noted on EGD, although suspect this may be more likely the result of vomiting and not truly reflux driving this process. Her EGD showed no evidence of celiac. Her symptoms are not typical for biliary colic. Reassured her she has had multiple CT scans in recent years, do not see any concerning pathology to cause these symptoms. Discussed possibly trying Reglan, she declined following a discussion of this.   Iron deficiency - patient reports history of iron deficiency and needing IV iron despite hysterectomy. Recent EGD showed no pathology to cause this. I will obtain report from colonoscopy she states was done in 2015 to clarify this, and also obtain records of her last labs and iron studies to confirm this history. If she continues to be truly iron deficient and this is new may consider a colonoscopy. Imaging of the abdomen shows no small bowel pathology.   [place] Cellar, MD [facility] Pager [phone]

## 2017-03-14 NOTE — Patient Instructions (Addendum)
If you are age 38 or older, your body mass index should be between 23-30. Your Body mass index is 35.78 kg/m. If this is out of the aforementioned range listed, please consider follow up with your Primary Care Provider.  If you are age 84 or younger, your body mass index should be between 19-25. Your Body mass index is 35.78 kg/m. If this is out of the aformentioned range listed, please consider follow up with your Primary Care Provider.   We have given you a prescription for Zofran, 4mg ; take 1 every 8 hours as needed  Begin taking FDgard, 1 to 2 tablets prior to meals.  Continue Protonix.  We are requesting records from Dr. Danna Hefty office.   Thank you for entrusting me with your care and for [facility], Dr. [place] Cellar

## 2017-03-15 DIAGNOSIS — Z1389 Encounter for screening for other disorder: Secondary | ICD-10-CM | POA: Diagnosis not present

## 2017-03-15 DIAGNOSIS — M25571 Pain in right ankle and joints of right foot: Secondary | ICD-10-CM | POA: Diagnosis not present

## 2017-03-15 DIAGNOSIS — M25572 Pain in left ankle and joints of left foot: Secondary | ICD-10-CM | POA: Diagnosis not present

## 2017-03-15 DIAGNOSIS — D508 Other iron deficiency anemias: Secondary | ICD-10-CM | POA: Diagnosis not present

## 2017-03-18 ENCOUNTER — Other Ambulatory Visit: Payer: Self-pay

## 2017-03-18 MED ORDER — ONDANSETRON 4 MG PO TBDP: 4.0000 mg | ORAL_TABLET | Freq: Three times a day (TID) | ORAL | 3 refills | Status: DC | PRN

## 2017-03-18 NOTE — Progress Notes (Signed)
Pt wanted Rx sent to [facility] in  [name]

## 2017-03-19 ENCOUNTER — Telehealth: Payer: Self-pay | Admitting: Gastroenterology

## 2017-03-19 DIAGNOSIS — M25579 Pain in unspecified ankle and joints of unspecified foot: Secondary | ICD-10-CM | POA: Diagnosis not present

## 2017-03-19 MED ORDER — ONDANSETRON 4 MG PO TBDP: 4.0000 mg | ORAL_TABLET | Freq: Three times a day (TID) | ORAL | 3 refills | Status: DC | PRN

## 2017-03-19 NOTE — Telephone Encounter (Signed)
Patient states [facility] sent over prior auth for medication pantoprazole and was wanting to check status. Pt also states medication zofran was prescribed for only 20 day supply instead of 90. Pt would like a call to discuss.

## 2017-03-19 NOTE — Telephone Encounter (Signed)
Called and spoke to pt. Let her know that I have submitted the Prior Auth for pantoprazole: CoverMyMeds using [employer] insurance. She confirmed this is her latest insurance. Aslo iInformed pt regarding GoodRx and that medication can be purchased over the counter.  She was unaware and indicated she would look into that in the interim until we hear back regarding prior authorization. Regarding Zofran I had already corrected and resent a rx for Zofran with #90 quantity and 3 refills but she asked that I sent it to [facility] of [facility].  [facility] and [facility] so she wants to keep both in her chart.  We are to use [facility]ole but [facility] for Zofran at this point.  I told pt I would call her back when I hear back regarding Prior Auth. She expressed understanding and appreciation.

## 2017-03-20 ENCOUNTER — Telehealth: Payer: Self-pay | Admitting: Gastroenterology

## 2017-03-20 DIAGNOSIS — M25571 Pain in right ankle and joints of right foot: Secondary | ICD-10-CM | POA: Diagnosis not present

## 2017-03-20 DIAGNOSIS — M79672 Pain in left foot: Secondary | ICD-10-CM | POA: Diagnosis not present

## 2017-03-20 DIAGNOSIS — Z1389 Encounter for screening for other disorder: Secondary | ICD-10-CM | POA: Diagnosis not present

## 2017-03-20 DIAGNOSIS — D509 Iron deficiency anemia, unspecified: Secondary | ICD-10-CM

## 2017-03-20 NOTE — Telephone Encounter (Signed)
Records received from prior workup:  Labs 01/31/2017  Iron 25 (low) Iron sat 7% (low) TIBC 373  Hgb 10.0, MCV 80, plt 369, WBC 8.6 Normal BMET and LFTs  Colonoscopy done 10/2010 by Dr. Penelope Coop - indication was "abdominal pain" - normal colonoscopy, bowel prep reported as good   Overall, she appears to have an iron deficiency of unclear etiology. She is not having any menstrual cycles so this would appear to be related to GI tract loss. EGD without pathology, small bowel appeared normal but biopsies not obtained. Given her last colonoscopy was almost 7 years ago, with new iron deficiency a colonoscopy would be recommended. I would check celiac serologies to ensure negative.   [name], can you please let this patient know I received her records and she does have an iron deficiency. I am recommending TTG IgA, and total IgA level if you can have her go to the lab. If that is normal, then recommending we proceed with a colonoscopy for her iron deficiency. Please let me know if she has any questions. Thanks

## 2017-03-20 NOTE — Telephone Encounter (Signed)
Okay, we can change pantoprazole to omeprazole, same dose, if that works. Thanks

## 2017-03-20 NOTE — Telephone Encounter (Signed)
Prior Auth for Pantoprazole was denied by [facility] due to "medication is not on the [facility] prescription plan".  Please advise.

## 2017-03-20 NOTE — Telephone Encounter (Signed)
Called and LM for pt to call back.  Can send Rx for Omeprazole if she would like.  Needs to clarify which pharmacy to sent to.

## 2017-03-21 MED ORDER — OMEPRAZOLE 40 MG PO CPDR: 40.0000 mg | DELAYED_RELEASE_CAPSULE | Freq: Two times a day (BID) | ORAL | 5 refills | Status: DC

## 2017-03-21 NOTE — Telephone Encounter (Signed)
Spoke to pt. Relayed results and recommendations.  She said she will go to the lab tomorrow, Friday (03-22-17). Lab Orders entered.

## 2017-03-21 NOTE — Telephone Encounter (Signed)
Pt said she is returning your call [phone]

## 2017-03-21 NOTE — Addendum Note (Signed)
Addended by: Roetta Sessions on: 03/21/2017 03:04 PM   Modules accepted: Orders

## 2017-03-21 NOTE — Telephone Encounter (Signed)
Spoke to pt. She is agreeable to trying Omeprazole.  She would like to have script sent to [facility]. Script sent.  Also - Prior Auth for Zofran sent to CoverMyMeds this morning.  Told her I would call her when I hear back on insurance decision.

## 2017-03-22 ENCOUNTER — Telehealth: Payer: Self-pay | Admitting: Gastroenterology

## 2017-03-22 ENCOUNTER — Other Ambulatory Visit (INDEPENDENT_AMBULATORY_CARE_PROVIDER_SITE_OTHER): Payer: BLUE CROSS/BLUE SHIELD

## 2017-03-22 DIAGNOSIS — D509 Iron deficiency anemia, unspecified: Secondary | ICD-10-CM

## 2017-03-22 LAB — IGA: IgA: 130 mg/dL (ref 68–378)

## 2017-03-22 MED ORDER — ONDANSETRON 4 MG PO TBDP: 4.0000 mg | ORAL_TABLET | Freq: Three times a day (TID) | ORAL | 3 refills | Status: DC | PRN

## 2017-03-22 NOTE — Telephone Encounter (Signed)
Insurance ran a test claim and for #24 no prior auth required

## 2017-03-22 NOTE — Telephone Encounter (Signed)
Patient notified that rx sent for #24.

## 2017-03-22 NOTE — Telephone Encounter (Signed)
I spoke with her insurance and they will only cover #24 tablets in a 24 hour period.  Rx sent for #24.   Left message for patient to call back

## 2017-03-25 DIAGNOSIS — R339 Retention of urine, unspecified: Secondary | ICD-10-CM | POA: Diagnosis not present

## 2017-03-25 LAB — TISSUE TRANSGLUTAMINASE ABS,IGG,IGA
(tTG) Ab, IgA: 1 U/mL
(tTG) Ab, IgG: 1 U/mL

## 2017-03-26 ENCOUNTER — Telehealth: Payer: Self-pay | Admitting: Gastroenterology

## 2017-03-26 ENCOUNTER — Other Ambulatory Visit: Payer: Self-pay

## 2017-03-26 DIAGNOSIS — D509 Iron deficiency anemia, unspecified: Secondary | ICD-10-CM

## 2017-03-26 MED ORDER — NA SULFATE-K SULFATE-MG SULF 17.5-3.13-1.6 GM/177ML PO SOLN: 1.0000 | Freq: Once | ORAL | 0 refills | Status: AC

## 2017-03-29 ENCOUNTER — Encounter: Payer: Self-pay | Admitting: Gastroenterology

## 2017-03-29 ENCOUNTER — Ambulatory Visit (AMBULATORY_SURGERY_CENTER): Payer: BLUE CROSS/BLUE SHIELD | Admitting: Gastroenterology

## 2017-03-29 ENCOUNTER — Other Ambulatory Visit: Payer: Self-pay

## 2017-03-29 VITALS — BP 118/65 | HR 75 | Temp 97.3°F | Resp 13 | Ht 65.0 in | Wt 215.0 lb

## 2017-03-29 DIAGNOSIS — D509 Iron deficiency anemia, unspecified: Secondary | ICD-10-CM

## 2017-03-29 DIAGNOSIS — D123 Benign neoplasm of transverse colon: Secondary | ICD-10-CM | POA: Diagnosis not present

## 2017-03-29 MED ORDER — SODIUM CHLORIDE 0.9 % IV SOLN: 500.0000 mL | Freq: Once | INTRAVENOUS | Status: DC

## 2017-03-29 NOTE — Op Note (Signed)
[facility] Patient Name: [name] Procedure Date: 03/29/2017 8:33 AM MRN: [id number] Endoscopist: Remo Lipps P. Benay Pomeroy MD, MD Age: 37 Referring MD:  Date of Birth: November 11, 1980 Gender: Female Account #: [id number] Procedure:                Colonoscopy Indications:              Iron deficiency anemia, no pathology noted on EGD                            to cause anemia Medicines:                Monitored Anesthesia Care Procedure:                Pre-Anesthesia Assessment:                           - Prior to the procedure, a History and Physical                            was performed, and patient medications and                            allergies were reviewed. The patient's tolerance of                            previous anesthesia was also reviewed. The risks                            and benefits of the procedure and the sedation                            options and risks were discussed with the patient.                            All questions were answered, and informed consent                            was obtained. Prior Anticoagulants: The patient has                            taken no previous anticoagulant or antiplatelet                            agents. ASA Grade Assessment: II - A patient with                            mild systemic disease. After reviewing the risks                            and benefits, the patient was deemed in                            satisfactory condition to undergo the procedure.  After obtaining informed consent, the colonoscope                            was passed under direct vision. Throughout the                            procedure, the patient's blood pressure, pulse, and                            oxygen saturations were monitored continuously. The                            Model PCF-[id number] scope was introduced                            through the anus and advanced to  the the terminal                            ileum. The colonoscopy was performed without                            difficulty. The patient tolerated the procedure                            well. The quality of the bowel preparation was                            good. The terminal ileum and the rectum, surgical                            anastomosis were photographed. Scope In: 8:39:42 AM Scope Out: 8:56:12 AM Scope Withdrawal Time: 0 hours 12 minutes 43 seconds  Total Procedure Duration: 0 hours 16 minutes 30 seconds  Findings:                 The perianal and digital rectal examinations were                            normal.                           There was evidence of a prior end-to-end                            ileo-colonic anastomosis in the ascending colon.                            This was patent and was characterized by healthy                            appearing mucosa. The anastomosis was traversed.                           The terminal ileum appeared normal.  A 3 mm polyp was found in the transverse colon. The                            polyp was sessile. The polyp was removed with a                            cold biopsy forceps. Resection and retrieval were                            complete.                           The exam was otherwise without abnormality on                            direct and retroflexion views. Complications:            No immediate complications. Estimated blood loss:                            Minimal. Estimated Blood Loss:     Estimated blood loss was minimal. Impression:               - Patent end-to-end ileo-colonic anastomosis,                            characterized by healthy appearing mucosa.                           - The examined portion of the ileum was normal.                           - One 3 mm polyp in the transverse colon, removed                            with a cold biopsy forceps. Resected  and retrieved.                           - The examination was otherwise normal on direct                            and retroflexion views.                           No cause for iron deficiency noted on colonoscopy. Recommendation:           - Patient has a contact number available for                            emergencies. The signs and symptoms of potential                            delayed complications were discussed with the  patient. Return to normal activities tomorrow.                            Written discharge instructions were provided to the                            patient.                           - Resume previous diet.                           - Continue present medications.                           - Await pathology results.                           - Repeat colonoscopy may be recommended for                            surveillance based on pathology results.                           - Consideration for small bowel evaluation to                            further evaluate anemia (history of small bowel                            resection due to bowel injury during another                            surgery), will discuss with patient her candidacy                            for capsule endoscopy [name] P. Analysse Quinonez MD, MD 03/29/2017 9:03:49 AM This report has been signed electronically.

## 2017-03-29 NOTE — Patient Instructions (Signed)
YOU HAD AN ENDOSCOPIC PROCEDURE TODAY AT THE [facility]:   Refer to the procedure report that was given to you for any specific questions about what was found during the examination.  If the procedure report does not answer your questions, please call your gastroenterologist to clarify.  If you requested that your care partner not be given the details of your procedure findings, then the procedure report has been included in a sealed envelope for you to review at your convenience later.  YOU SHOULD EXPECT: Some feelings of bloating in the abdomen. Passage of more gas than usual.  Walking can help get rid of the air that was put into your GI tract during the procedure and reduce the bloating. If you had a lower endoscopy (such as a colonoscopy or flexible sigmoidoscopy) you may notice spotting of blood in your stool or on the toilet paper. If you underwent a bowel prep for your procedure, you may not have a normal bowel movement for a few days.  Please Note:  You might notice some irritation and congestion in your nose or some drainage.  This is from the oxygen used during your procedure.  There is no need for concern and it should clear up in a day or so.  SYMPTOMS TO REPORT IMMEDIATELY:   Following lower endoscopy (colonoscopy or flexible sigmoidoscopy):  Excessive amounts of blood in the stool  Significant tenderness or worsening of abdominal pains  Swelling of the abdomen that is new, acute  Fever of 100F or higher   Following upper endoscopy (EGD)  Vomiting of blood or coffee ground material  New chest pain or pain under the shoulder blades  Painful or persistently difficult swallowing  New shortness of breath  Fever of 100F or higher  Black, tarry-looking stools  For urgent or emergent issues, a gastroenterologist can be reached at any hour by calling [phone].   DIET:  We do recommend a small meal at first, but then you may proceed to your regular diet.  Drink  plenty of fluids but you should avoid alcoholic beverages for 24 hours.  ACTIVITY:  You should plan to take it easy for the rest of today and you should NOT DRIVE or use heavy machinery until tomorrow (because of the sedation medicines used during the test).    FOLLOW UP: Our staff will call the number listed on your records the next business day following your procedure to check on you and address any questions or concerns that you may have regarding the information given to you following your procedure. If we do not reach you, we will leave a message.  However, if you are feeling well and you are not experiencing any problems, there is no need to return our call.  We will assume that you have returned to your regular daily activities without incident.  If any biopsies were taken you will be contacted by phone or by letter within the next 1-3 weeks.  Please call us at [phone] if you have not heard about the biopsies in 3 weeks.    SIGNATURES/CONFIDENTIALITY: You and/or your care partner have signed paperwork which will be entered into your electronic medical record.  These signatures attest to the fact that that the information above on your After Visit Summary has been reviewed and is understood.  Full responsibility of the confidentiality of this discharge information lies with you and/or your care-partner.  Polyp information given. 

## 2017-03-29 NOTE — Progress Notes (Signed)
Called to room to assist during endoscopic procedure.  Patient ID and intended procedure confirmed with present staff. Received instructions for my participation in the procedure from the performing physician.  

## 2017-03-29 NOTE — Progress Notes (Signed)
To recovery, report to RN, VSS. 

## 2017-03-31 ENCOUNTER — Telehealth: Payer: Self-pay | Admitting: Gastroenterology

## 2017-03-31 NOTE — Telephone Encounter (Signed)
She noted an increase in pelvic pain today starting around 2 am. Its not severe. No N/V, fevers, chills, change in bowel function, bleeding. Colonoscopy with cold bx 1/25 reviewed - she was concerned her symptoms were procedure related. Unlikely to be related to colonoscopy. She felt like her current symptoms were typical for a flare of her GU problems. I advised her to contact her Urologist and if her pain persisted then to ED for evaluation.

## 2017-04-01 ENCOUNTER — Telehealth: Payer: Self-pay | Admitting: *Deleted

## 2017-04-01 DIAGNOSIS — Z6834 Body mass index (BMI) 34.0-34.9, adult: Secondary | ICD-10-CM | POA: Diagnosis not present

## 2017-04-01 DIAGNOSIS — R1031 Right lower quadrant pain: Secondary | ICD-10-CM | POA: Diagnosis not present

## 2017-04-01 NOTE — Telephone Encounter (Signed)
The pt states she is going to see her Urologist today.  She believes the pain is not GI

## 2017-04-01 NOTE — Telephone Encounter (Signed)
  Follow up Call-  Call back number 03/29/2017 01/15/2017  Post procedure Call Back phone  # [phone] hm  Permission to leave phone message Yes Yes  Some recent data might be hidden     Patient questions:  Do you have a fever, pain , or abdominal swelling? Yes.   Pain Score  0 *  Have you tolerated food without any problems? Yes.    Have you been able to return to your normal activities? Yes.    Do you have any questions about your discharge instructions: Diet   No. Medications  No. Follow up visit  No.  Do you have questions or concerns about your Care? No.  Actions: * If pain score is 4 or above: No action needed, pain <4. Patient stating she is better since phone note with Dr. Fuller Plan yesterday. Patient now stating it is all adding up to "an ovarian cyst" type of pain, back pain, breast tenderness. Patient stating she may follow up with her GYN today if an appointment is available.

## 2017-04-01 NOTE — Telephone Encounter (Signed)
[name] can you touch base with this patient to see if she is feeling any better, after calling the weekend line? I agree with Dr. Lynne Leader impression. Thanks

## 2017-04-02 DIAGNOSIS — F3111 Bipolar disorder, current episode manic without psychotic features, mild: Secondary | ICD-10-CM | POA: Diagnosis not present

## 2017-04-03 DIAGNOSIS — N938 Other specified abnormal uterine and vaginal bleeding: Secondary | ICD-10-CM | POA: Diagnosis not present

## 2017-04-03 DIAGNOSIS — Z6834 Body mass index (BMI) 34.0-34.9, adult: Secondary | ICD-10-CM | POA: Diagnosis not present

## 2017-04-08 ENCOUNTER — Ambulatory Visit
Admission: RE | Admit: 2017-04-08 | Discharge: 2017-04-08 | Disposition: A | Discharge status: Home / Self Care | Payer: BLUE CROSS/BLUE SHIELD | Source: Ambulatory Visit | Attending: Internal Medicine | Admitting: Internal Medicine

## 2017-04-08 ENCOUNTER — Other Ambulatory Visit: Payer: Self-pay | Admitting: Internal Medicine

## 2017-04-08 DIAGNOSIS — E282 Polycystic ovarian syndrome: Secondary | ICD-10-CM | POA: Diagnosis not present

## 2017-04-08 DIAGNOSIS — K37 Unspecified appendicitis: Secondary | ICD-10-CM

## 2017-04-08 DIAGNOSIS — D509 Iron deficiency anemia, unspecified: Secondary | ICD-10-CM | POA: Diagnosis not present

## 2017-04-08 DIAGNOSIS — D508 Other iron deficiency anemias: Secondary | ICD-10-CM | POA: Diagnosis not present

## 2017-04-08 DIAGNOSIS — R1030 Lower abdominal pain, unspecified: Secondary | ICD-10-CM | POA: Diagnosis not present

## 2017-04-08 DIAGNOSIS — R112 Nausea with vomiting, unspecified: Secondary | ICD-10-CM | POA: Diagnosis not present

## 2017-04-08 DIAGNOSIS — D72829 Elevated white blood cell count, unspecified: Secondary | ICD-10-CM | POA: Diagnosis not present

## 2017-04-08 MED ORDER — IOPAMIDOL (ISOVUE-300) INJECTION 61%
100.0000 mL | Freq: Once | INTRAVENOUS | Status: AC | PRN
  Administered 2017-04-08: 125 mL via INTRAVENOUS

## 2017-04-08 NOTE — Telephone Encounter (Signed)
Patient returning Julie's call about path results.

## 2017-04-10 DIAGNOSIS — D72829 Elevated white blood cell count, unspecified: Secondary | ICD-10-CM | POA: Diagnosis not present

## 2017-04-16 DIAGNOSIS — Z79899 Other long term (current) drug therapy: Secondary | ICD-10-CM | POA: Diagnosis not present

## 2017-04-16 DIAGNOSIS — M545 Low back pain: Secondary | ICD-10-CM | POA: Diagnosis not present

## 2017-04-16 DIAGNOSIS — G894 Chronic pain syndrome: Secondary | ICD-10-CM | POA: Diagnosis not present

## 2017-04-16 DIAGNOSIS — M25511 Pain in right shoulder: Secondary | ICD-10-CM | POA: Diagnosis not present

## 2017-04-16 DIAGNOSIS — R102 Pelvic and perineal pain: Secondary | ICD-10-CM | POA: Diagnosis not present

## 2017-04-16 DIAGNOSIS — Z5181 Encounter for therapeutic drug level monitoring: Secondary | ICD-10-CM | POA: Diagnosis not present

## 2017-04-23 ENCOUNTER — Encounter: Payer: BLUE CROSS/BLUE SHIELD | Admitting: Gastroenterology

## 2017-04-26 ENCOUNTER — Telehealth: Payer: Self-pay

## 2017-04-26 NOTE — Telephone Encounter (Signed)
-----   Message from Yetta Flock, MD sent at 04/26/2017  7:49 AM EST ----- Regarding: follow up visit Hi [name], I think this patient is scheduled to see me in May. Do we have any openings to see her sooner? It would be better to see her sooner if possible. Thanks

## 2017-04-26 NOTE — Telephone Encounter (Signed)
Called and LM for pt on home and cell to call and reschedule for a sooner appt with Dr. Havery Moros.  He has spots available in early April as this time.

## 2017-04-29 NOTE — Telephone Encounter (Signed)
Spoke to pt. She is switching to a different Gastro office.  She did not want to give any feedback as to why she is switching.  She asked that I cancel her upcoming appt with Dr. Havery Moros.  I have notified Dr. Havery Moros.

## 2017-04-29 NOTE — Progress Notes (Signed)
Office Visit Note  Patient: [name]             Date of Birth: 1980/04/26           MRN: [id number]             PCP: Prince Solian, MD Referring: Marton Redwood, MD Visit Date: 05/10/2017 Occupation: on disability    Subjective:   Pain in both ankles  History of Present Illness: [name] is a 37 y.o. female seen in consultation per request of Dr. Dagmar Hait.  According to patient her symptoms started in 2005 with increased joint pain, generalized pain, paresthesias and hyperalgesia.  She recalls that she came to see me at that time and was diagnosed with fibromyalgia syndrome.  She was treated with multiple medications including Cymbalta and Neurontin without much help.  She states that her symptoms persist she started going to the pain management.  Where she was evaluated her neurostimulator was placed.  She had no benefit from that.  She states she was tried on different medications and was also sent to physical therapy without any results.  She has been going to pain management where she has been getting pain medications now.  She has had recurrent problems with interstitial cystitis and she had bladder resection in 2011 due to chronic pain.  She states for the last few months she has been having pain in bilateral ankles.  She has grinding sensation in her ankles.  She states she has had x-rays which were unremarkable and labs which were unremarkable.  She also has discomfort in her shoulders, wrist joints in her hands but no joint swelling.  None of the other joints are painful.  Activities of Daily Living:  Patient reports morning stiffness for 1 hour.   Patient Reports nocturnal pain.  Difficulty dressing/grooming: Denies Difficulty climbing stairs: Denies Difficulty getting out of chair: Denies Difficulty using hands for taps, buttons, cutlery, and/or writing: Denies   Review of Systems  Constitutional: Positive for fatigue. Negative for weakness.  HENT: Positive  for mouth dryness. Negative for mouth sores, trouble swallowing, trouble swallowing and nose dryness.   Eyes: Positive for dryness. Negative for pain, redness and visual disturbance.  Respiratory: Negative for cough, hemoptysis, shortness of breath and difficulty breathing.   Cardiovascular: Negative for chest pain, palpitations, hypertension, irregular heartbeat and swelling in legs/feet.  Gastrointestinal: Negative for blood in stool, constipation and diarrhea.  Endocrine: Negative for increased urination.  Genitourinary: Positive for painful urination.  Musculoskeletal: Positive for arthralgias, joint pain, myalgias, morning stiffness, muscle tenderness and myalgias. Negative for joint swelling and muscle weakness.  Skin: Positive for color change, rash (Facial rash), hair loss and sensitivity to sunlight. Negative for pallor, nodules/bumps, redness, skin tightness and ulcers.  Allergic/Immunologic: Negative for susceptible to infections.  Neurological: Positive for headaches. Negative for dizziness and numbness.  Hematological: Negative for swollen glands.  Psychiatric/Behavioral: Positive for depressed mood and sleep disturbance. The patient is not nervous/anxious.     PMFS History:  Patient Active Problem List   Diagnosis Date Noted  . SBO (small bowel obstruction) ([place]) 08/03/2015  . Hypothyroidism 08/03/2015  . ADHD (attention deficit hyperactivity disorder) 08/03/2015  . Depression 08/03/2015  . Fibromyalgia 08/03/2015  . Intra-abdominal abscess [facility])     Past Medical History:  Diagnosis Date  . ADHD (attention deficit hyperactivity disorder)   . Allergy   . Anxiety   . Bipolar disorder ([place])   . Chronic back pain   .  Chronic headaches   . Chronic interstitial cystitis    "removed my bladder when I went to end stage IC"  . Depression   . Emphysema of lung ([place])    gestational only  . Fibromyalgia   . GERD (gastroesophageal reflux disease)   . History of blood  transfusion 02/2010   "related to OR"  . HLD (hyperlipidemia)   . Hypothyroid   . Interstitial cystitis   . Self-catheterizes urinary bladder    "I have an [place] pouch; made a bladder using part of my small intestines; cath out of stoma in my belly button" (08/02/2015)  . Small bowel obstruction ([place]) 08/03/2015  . Thyroid disease     Family History  Problem Relation Age of Onset  . Lung cancer Mother   . Diabetes Paternal Grandmother   . Heart disease Paternal Grandmother   . Diabetes Paternal Grandfather   . Heart disease Paternal Grandfather   . Pancreatic cancer Paternal Grandfather   . Liver cancer Paternal Grandfather   . Breast cancer Maternal Aunt   . Colon cancer Neg Hx   . Esophageal cancer Neg Hx   . Prostate cancer Neg Hx   . Rectal cancer Neg Hx   . Stomach cancer Neg Hx    Past Surgical History:  Procedure Laterality Date  . ABDOMINAL HYSTERECTOMY  02/2010   "left my ovaries"  . APPENDECTOMY  02/2010  . BLADDER REMOVAL  02/2010   and [place]  . CESAREAN SECTION  2009  . COLONOSCOPY    . OTHER SURGICAL HISTORY     remval of urethra when bladder was removed  . SMALL INTESTINE SURGERY  2017   for bowel obstruction  . SPINAL CORD STIMULATOR IMPLANT  09/2009; ~ 04/2015  . SPINAL CORD STIMULATOR REMOVAL  ~ 04/2015  . UPPER GI ENDOSCOPY    . WISDOM TOOTH EXTRACTION     Social History   Social History Narrative  . Not on file     Objective: Vital Signs: BP 106/62 (BP Location: Left Arm, Patient Position: Sitting, Cuff Size: Normal)   Pulse 74   Resp 17   Ht '5\' 6"'$  (1.676 m)   Wt 223 lb (101.2 kg)   BMI 35.99 kg/m    Physical Exam  Constitutional: She is oriented to person, place, and time. She appears well-developed and well-nourished.  HENT:  Head: Normocephalic and atraumatic.  Eyes: Conjunctivae and EOM are normal.  Neck: Normal range of motion.  Cardiovascular: Normal rate, regular rhythm, normal heart sounds and intact distal pulses.    Pulmonary/Chest: Effort normal and breath sounds normal.  Abdominal: Soft. Bowel sounds are normal.  Lymphadenopathy:    She has no cervical adenopathy.  Neurological: She is alert and oriented to person, place, and time.  Skin: Skin is warm and dry. Capillary refill takes less than 2 seconds.  Psychiatric: She has a normal mood and affect. Her behavior is normal.  Nursing note and vitals reviewed.    Musculoskeletal Exam: C-spine thoracic lumbar spine limited range of motion with discomfort.  Shoulder joints elbow joints wrist joint MCPs PIPs DIPs were in good range of motion with no synovitis.  Hip joints and knee joints are good range of motion with notes discomfort or swelling.  She has tenderness on palpation of bilateral ankle joints without any warmth swelling.  She has no tenderness over MTPs PIPs.  CDAI Exam: No CDAI exam completed.    Investigation: Findings:  March 20, 2017 ESR 11, ANA  negative, anti-CCP negative  CBC Latest Ref Rng & Units 05/12/2016 08/05/2015 08/04/2015  WBC 4.0 - 10.5 K/uL 19.9(H) 14.2(H) 12.0(H)  Hemoglobin 12.0 - 15.0 g/dL 13.7 11.0(L) 11.5(L)  Hematocrit 36.0 - 46.0 % 41.2 35.4(L) 36.6  Platelets 150 - 400 K/uL 376 361 331   CMP Latest Ref Rng & Units 05/12/2016 08/05/2015 08/04/2015  Glucose 65 - 99 mg/dL 124(H) 119(H) 113(H)  BUN 6 - 20 mg/dL 20 5(L) 9  Creatinine 0.44 - 1.00 mg/dL 1.21(H) 0.93 0.82  Sodium 135 - 145 mmol/L 135 137 137  Potassium 3.5 - 5.1 mmol/L 3.1(L) 3.5 3.4(L)  Chloride 101 - 111 mmol/L 104 107 109  CO2 22 - 32 mmol/L 21(L) 23 20(L)  Calcium 8.9 - 10.3 mg/dL 9.0 8.4(L) 8.2(L)  Total Protein 6.5 - 8.1 g/dL 7.9 - 6.3(L)  Total Bilirubin 0.3 - 1.2 mg/dL 0.7 - 0.6  Alkaline Phos 38 - 126 U/L 38 - 60  AST 15 - 41 U/L 17 - 19  ALT 14 - 54 U/L 16 - 25    Imaging: Xr Ankle 2 Views Left  Result Date: 05/10/2017 No tibiotalar or subtalar joint space narrowing was noted.  No erosive changes were noted. Impression unremarkable x-ray  of the ankle joint.  Xr Ankle 2 Views Right  Result Date: 05/10/2017 No tibiotalar or subtalar joint space narrowing was noted.  No erosive changes were noted. Impression unremarkable x-ray of the ankle joint.   Speciality Comments: No specialty comments available.    Procedures:  No procedures performed Allergies: Keflex [cephalexin]; Penicillins; and Sulfa antibiotics   Assessment / Plan:     Visit Diagnoses: Chronic pain of both ankles -patient complains of persistent swelling and discomfort in her bilateral ankles for the last few months.  I did not appreciate any swelling or synovitis on examination but she did have tenderness on examination.  I also reviewed the labs done at her PCPs office which include sed rate, ANA and anti-CCP which were negative.  To complete the workup I will obtain x-rays and the labs today.  Plan: XR Ankle 2 Views Right, XR Ankle 2 Views Left.  Her x-rays were unremarkable.  I discussed with patient the results.  I also advised her that we will get autoimmune labs.  If the labs are negative then she may not need follow-up visit.  Fibromyalgia -patient has long-standing history of fibromyalgia with generalized pain and discomfort.  She also has positive tender points and hyperalgesia.  She is on multiple medications per pain management.  Vitamin D deficiency: She reports taking vitamin D.  Interstitial cystitis: She has been followed by urologist.  She is also had bladder resection surgery.  History of hypothyroidism  History of iron deficiency: She is on supplements.   Orders: Orders Placed This Encounter  Procedures  . XR Ankle 2 Views Right  . XR Ankle 2 Views Left  . Uric acid  . 14-3-3 eta Protein  . Rheumatoid factor  . Angiotensin converting enzyme   No orders of the defined types were placed in this encounter.   Face-to-face time spent with patient was 50 minutes.  Greater than 50% of time was spent in counseling and coordination of  care.  Follow-Up Instructions: No Follow-up on file.   Bo Merino, MD  Note - This record has been created using Editor, commissioning.  Chart creation errors have been sought, but may not always  have been located. Such creation errors do not reflect on  the standard  of medical care.

## 2017-04-30 DIAGNOSIS — Z6834 Body mass index (BMI) 34.0-34.9, adult: Secondary | ICD-10-CM | POA: Diagnosis not present

## 2017-04-30 DIAGNOSIS — N83209 Unspecified ovarian cyst, unspecified side: Secondary | ICD-10-CM | POA: Diagnosis not present

## 2017-05-01 DIAGNOSIS — Z87448 Personal history of other diseases of urinary system: Secondary | ICD-10-CM | POA: Diagnosis not present

## 2017-05-01 DIAGNOSIS — Z9889 Other specified postprocedural states: Secondary | ICD-10-CM | POA: Diagnosis not present

## 2017-05-01 DIAGNOSIS — R109 Unspecified abdominal pain: Secondary | ICD-10-CM | POA: Diagnosis not present

## 2017-05-01 DIAGNOSIS — F419 Anxiety disorder, unspecified: Secondary | ICD-10-CM | POA: Insufficient documentation

## 2017-05-01 DIAGNOSIS — N3011 Interstitial cystitis (chronic) with hematuria: Secondary | ICD-10-CM | POA: Diagnosis not present

## 2017-05-06 ENCOUNTER — Telehealth: Payer: Self-pay | Admitting: Gastroenterology

## 2017-05-06 NOTE — Telephone Encounter (Signed)
Spoke to patient, she said her urologist did an ultrasound and found some gallstones. I do not see this report in Epic system, I have asked patient to have the urologist fax that report here and I would contact her afterwards.

## 2017-05-06 NOTE — Telephone Encounter (Signed)
Patient states she just found out she had gallstones and wants to know what to do.

## 2017-05-07 NOTE — Telephone Encounter (Signed)
Ok thanks for the update Almyra Free

## 2017-05-07 NOTE — Telephone Encounter (Signed)
Spoke to patient, she understands about gallstone symptoms and had called her urologist's office yesterday and today to have [place] report sent over here. I can see phone conversations in [place]. Patient already had an office visit scheduled here for 4/29, did not seem to have a problem with being seen here.  I did offer her an appointment 6 days sooner but due to her schedule cannot make it that day, so will keep her appointment on 4/29. I have put her on the move up list incase something comes available.

## 2017-05-07 NOTE — Telephone Encounter (Signed)
[name] can you please relay the following: - gallstones are a common finding. Symptoms of gallstones usually includes upper abdominal pain that is elicited by eating something. If she notes this routinely, especially in her RUQ, it could be related to gallstones and we would consider a referral to see surgery to discuss having her gallbladder removed. If she does not have these symptoms, there is probably nothing needed to do, but I do not see the [place] report to clarify the findings. If she would like to see [place] in clinic for reassessment I'd be happy to discuss it further with her.  Of note, [name] tried to schedule an office visit for her recently and she declined, stating she did not wish to be seen here anymore. Can you clarify this? thanks

## 2017-05-10 ENCOUNTER — Ambulatory Visit (INDEPENDENT_AMBULATORY_CARE_PROVIDER_SITE_OTHER): Payer: BLUE CROSS/BLUE SHIELD | Admitting: Rheumatology

## 2017-05-10 ENCOUNTER — Encounter: Payer: Self-pay | Admitting: Rheumatology

## 2017-05-10 ENCOUNTER — Ambulatory Visit (INDEPENDENT_AMBULATORY_CARE_PROVIDER_SITE_OTHER): Payer: Self-pay

## 2017-05-10 VITALS — BP 106/62 | HR 74 | Resp 17 | Ht 66.0 in | Wt 223.0 lb

## 2017-05-10 DIAGNOSIS — M797 Fibromyalgia: Secondary | ICD-10-CM

## 2017-05-10 DIAGNOSIS — N3010 Interstitial cystitis (chronic) without hematuria: Secondary | ICD-10-CM | POA: Diagnosis not present

## 2017-05-10 DIAGNOSIS — G8929 Other chronic pain: Secondary | ICD-10-CM

## 2017-05-10 DIAGNOSIS — Z8639 Personal history of other endocrine, nutritional and metabolic disease: Secondary | ICD-10-CM | POA: Diagnosis not present

## 2017-05-10 DIAGNOSIS — E559 Vitamin D deficiency, unspecified: Secondary | ICD-10-CM

## 2017-05-10 DIAGNOSIS — M25571 Pain in right ankle and joints of right foot: Secondary | ICD-10-CM | POA: Diagnosis not present

## 2017-05-10 DIAGNOSIS — M25572 Pain in left ankle and joints of left foot: Secondary | ICD-10-CM | POA: Diagnosis not present

## 2017-05-14 LAB — ANGIOTENSIN CONVERTING ENZYME: Angiotensin-Converting Enzyme: 18 U/L (ref 9–67)

## 2017-05-14 LAB — 14-3-3 ETA PROTEIN

## 2017-05-14 LAB — RHEUMATOID FACTOR

## 2017-05-14 LAB — URIC ACID: URIC ACID, SERUM: 4.2 mg/dL (ref 2.5–7.0)

## 2017-05-14 NOTE — Progress Notes (Signed)
WNL

## 2017-05-17 ENCOUNTER — Ambulatory Visit: Payer: Self-pay | Admitting: General Surgery

## 2017-05-17 DIAGNOSIS — K8020 Calculus of gallbladder without cholecystitis without obstruction: Secondary | ICD-10-CM | POA: Diagnosis not present

## 2017-05-17 NOTE — H&P (Signed)
History of Present Illness Lindsay Ok MD; 05/17/2017 11:05 AM) The patient is a 37 year old female who presents for evaluation of gall stones. Referred by: Dr. Bevelyn Buckles Chief Complaint: Gallstones  Patient is a 37 year old female with a history of interstitial cystitis, history of [place] pouch, chronic pain, comes in with a history of epigastric pain. She states that the pain usually follows meals. She states that these meals are high in fat or acidic. She states this is associated with some nausea vomiting. She denies any diarrhea.  Patient underwent a renal ultrasound was found to have gallstones on this exam. Patient also underwent CT scan. There do appear to be some small gallstones within the gallbladder on CT scan.  Patient has a lower midline incision from her [place] pouch in cystectomy.    Past Surgical History Levonne Spiller, CMA; 05/17/2017 10:43 AM) Appendectomy  Cesarean Section - 1  Colon Polyp Removal - Colonoscopy  Hysterectomy (not due to cancer) - Partial  Oral Surgery   Diagnostic Studies History Levonne Spiller, CMA; 05/17/2017 10:43 AM) Colonoscopy  within last year Mammogram  >3 years ago Pap Smear  1-5 years ago  Allergies Levonne Spiller, CMA; 05/17/2017 10:43 AM) Penicillins  Sulfa Drugs  Allergies Reconciled   Medication History Levonne Spiller, CMA; 05/17/2017 10:45 AM) Fenofibrate (48MG  Tablet, Oral) Active. Ibuprofen (800MG  Tablet, Oral) Active. LamoTRIgine (200MG  Tablet, Oral) Active. Levothyroxine Sodium (137MCG Tablet, Oral) Active. Nitrofurantoin Macrocrystal (50MG  Capsule, Oral) Active. Topiramate (100MG  Tablet, Oral) Active. TraZODone HCl (50MG  Tablet, Oral) Active. Trimethoprim (100MG  Tablet, Oral) Active. Vraylar (6MG  Capsule, Oral) Active. Amphetamine-Dextroamphetamine (30MG  Tablet, Oral) Active. Ondansetron (4MG  Tablet Disint, Oral) Active. Pantoprazole Sodium (40MG  Tablet DR,  Oral) Active. Medications Reconciled  Social History Andee Poles Education officer, [employer], CMA; 05/17/2017 10:43 AM) Alcohol use  Occasional alcohol use. Caffeine use  Carbonated beverages, Coffee. No drug use  Tobacco use  Never smoker.  Family History Levonne Spiller, [place]; 05/17/2017 10:43 AM) Cancer  Mother.  Pregnancy / Birth History Levonne Spiller, CMA; 05/17/2017 10:43 AM) Age at menarche  2 years. Contraceptive History  Oral contraceptives. Gravida  2 Irregular periods  Length (months) of breastfeeding  3-6 Maternal age  47-30 Para  1  Other Problems Levonne Spiller, CMA; 05/17/2017 10:43 AM) Anxiety Disorder  Back Pain  Bladder Problems  Cholelithiasis  Depression  Hypercholesterolemia  Kidney Stone  Thyroid Disease  Transfusion history     Review of Systems Lindsay Ok MD; 05/17/2017 11:03 AM) General Present- Fatigue and Weight Gain. Not Present- Appetite Loss, Chills, Fever, Night Sweats and Weight Loss. Skin Present- Dryness. Not Present- Change in Wart/Mole, Hives, Jaundice, New Lesions, Non-Healing Wounds, Rash and Ulcer. HEENT Present- Ringing in the Ears and Seasonal Allergies. Not Present- Earache, Hearing Loss, Hoarseness, Nose Bleed, Oral Ulcers, Sinus Pain, Sore Throat, Visual Disturbances, Wears glasses/contact lenses and Yellow Eyes. Respiratory Not Present- Bloody sputum, Chronic Cough, Difficulty Breathing, Snoring and Wheezing. Breast Not Present- Breast Mass, Breast Pain, Nipple Discharge and Skin Changes. Cardiovascular Not Present- Chest Pain. Gastrointestinal Present- Abdominal Pain, Nausea and Vomiting. Not Present- Bloating, Bloody Stool, Change in Bowel Habits, Chronic diarrhea, Constipation, Difficulty Swallowing, Excessive gas, Gets full quickly at meals, Hemorrhoids, Indigestion and Rectal Pain. Female Genitourinary Present- Pelvic Pain. Not Present- Frequency, Nocturia, Painful Urination and Urgency. Musculoskeletal  Present- Back Pain, Joint Pain, Joint Stiffness and Muscle Pain. Not Present- Muscle Weakness and Swelling of Extremities. Neurological Present- Headaches. Not Present- Decreased Memory, Fainting, Numbness, Seizures, Tingling, Tremor, Trouble walking and Weakness. Psychiatric Not Present-  Anxiety, Bipolar, Change in Sleep Pattern, Depression, Fearful and Frequent crying. Endocrine Present- Hair Changes. Not Present- Cold Intolerance, Excessive Hunger, Heat Intolerance, Hot flashes and New Diabetes. Hematology Not Present- Blood Thinners, Easy Bruising, Excessive bleeding, Gland problems, HIV and Persistent Infections. All other systems negative  Vitals Andee Poles Gerrigner CMA; 05/17/2017 10:45 AM) 05/17/2017 10:45 AM Weight: 215.25 lb Height: 66in Body Surface Area: 2.06 m Body Mass Index: 34.74 kg/m  Temp.: 98.41F(Oral)  Pulse: 127 (Regular)  BP: 136/100 (Sitting, Left Arm, Standard)       Physical Exam Lindsay Ok MD; 05/17/2017 11:06 AM) The physical exam findings are as follows: Note:Constitutional: No acute distress, conversant, appears stated age  Eyes: Anicteric sclerae, moist conjunctiva, no lid lag  Neck: No thyromegaly, trachea midline, no cervical lymphadenopathy  Lungs: Clear to auscultation biilaterally, normal respiratory effot  Cardiovascular: regular rate & rhythm, no murmurs, no peripheal edema, pedal pulses 2+  GI: Soft, no masses or hepatosplenomegaly, tenderness palpation epigastric/right upper quadrant, lower midline incision, ostomy site at approximately 11 o'clock position above the umbilicus.  MSK: Normal gait, no clubbing cyanosis, edema  Skin: No rashes, palpation reveals normal skin turgor  Psychiatric: Appropriate judgment and insight, oriented to person, place, and time    Assessment & Plan Lindsay Ok MD; 05/17/2017 11:07 AM) SYMPTOMATIC CHOLELITHIASIS (K80.20) Impression: 37 year old female with symptomatic  cholelithiasis, history of [place] pouch for interstitial cystitis, chronic pain medication.  1. We will proceed to the operating room for a laparoscopic cholecystectomy  2. Risks and benefits were discussed with the patient to generally include, but not limited to: infection, bleeding, possible need for post op ERCP, damage to the bile ducts, bile leak, and possible need for further surgery. Alternatives were offered and described. All questions were answered and the patient voiced understanding of the procedure and wishes to proceed at this point with a laparoscopic cholecystectomy 3. Patient has pain contract and sees [place] pain management [place].

## 2017-05-17 NOTE — H&P (View-Only) (Signed)
History of Present Illness Lindsay Ok MD; 05/17/2017 11:05 AM) The patient is a 38 year old female who presents for evaluation of gall stones. Referred by: Dr. Bevelyn Buckles Chief Complaint: Gallstones  Patient is a 37 year old female with a history of interstitial cystitis, history of [place] pouch, chronic pain, comes in with a history of epigastric pain. She states that the pain usually follows meals. She states that these meals are high in fat or acidic. She states this is associated with some nausea vomiting. She denies any diarrhea.  Patient underwent a renal ultrasound was found to have gallstones on this exam. Patient also underwent CT scan. There do appear to be some small gallstones within the gallbladder on CT scan.  Patient has a lower midline incision from her [place] pouch in cystectomy.    Past Surgical History Levonne Spiller, CMA; 05/17/2017 10:43 AM) Appendectomy  Cesarean Section - 1  Colon Polyp Removal - Colonoscopy  Hysterectomy (not due to cancer) - Partial  Oral Surgery   Diagnostic Studies History Levonne Spiller, CMA; 05/17/2017 10:43 AM) Colonoscopy  within last year Mammogram  >3 years ago Pap Smear  1-5 years ago  Allergies Levonne Spiller, CMA; 05/17/2017 10:43 AM) Penicillins  Sulfa Drugs  Allergies Reconciled   Medication History Levonne Spiller, CMA; 05/17/2017 10:45 AM) Fenofibrate (48MG  Tablet, Oral) Active. Ibuprofen (800MG  Tablet, Oral) Active. LamoTRIgine (200MG  Tablet, Oral) Active. Levothyroxine Sodium (137MCG Tablet, Oral) Active. Nitrofurantoin Macrocrystal (50MG  Capsule, Oral) Active. Topiramate (100MG  Tablet, Oral) Active. TraZODone HCl (50MG  Tablet, Oral) Active. Trimethoprim (100MG  Tablet, Oral) Active. Vraylar (6MG  Capsule, Oral) Active. Amphetamine-Dextroamphetamine (30MG  Tablet, Oral) Active. Ondansetron (4MG  Tablet Disint, Oral) Active. Pantoprazole Sodium (40MG  Tablet DR,  Oral) Active. Medications Reconciled  Social History Andee Poles Education officer, [employer], CMA; 05/17/2017 10:43 AM) Alcohol use  Occasional alcohol use. Caffeine use  Carbonated beverages, Coffee. No drug use  Tobacco use  Never smoker.  Family History Levonne Spiller, [place]; 05/17/2017 10:43 AM) Cancer  Mother.  Pregnancy / Birth History Levonne Spiller, CMA; 05/17/2017 10:43 AM) Age at menarche  55 years. Contraceptive History  Oral contraceptives. Gravida  2 Irregular periods  Length (months) of breastfeeding  3-6 Maternal age  63-30 Para  1  Other Problems Levonne Spiller, CMA; 05/17/2017 10:43 AM) Anxiety Disorder  Back Pain  Bladder Problems  Cholelithiasis  Depression  Hypercholesterolemia  Kidney Stone  Thyroid Disease  Transfusion history     Review of Systems Lindsay Ok MD; 05/17/2017 11:03 AM) General Present- Fatigue and Weight Gain. Not Present- Appetite Loss, Chills, Fever, Night Sweats and Weight Loss. Skin Present- Dryness. Not Present- Change in Wart/Mole, Hives, Jaundice, New Lesions, Non-Healing Wounds, Rash and Ulcer. HEENT Present- Ringing in the Ears and Seasonal Allergies. Not Present- Earache, Hearing Loss, Hoarseness, Nose Bleed, Oral Ulcers, Sinus Pain, Sore Throat, Visual Disturbances, Wears glasses/contact lenses and Yellow Eyes. Respiratory Not Present- Bloody sputum, Chronic Cough, Difficulty Breathing, Snoring and Wheezing. Breast Not Present- Breast Mass, Breast Pain, Nipple Discharge and Skin Changes. Cardiovascular Not Present- Chest Pain. Gastrointestinal Present- Abdominal Pain, Nausea and Vomiting. Not Present- Bloating, Bloody Stool, Change in Bowel Habits, Chronic diarrhea, Constipation, Difficulty Swallowing, Excessive gas, Gets full quickly at meals, Hemorrhoids, Indigestion and Rectal Pain. Female Genitourinary Present- Pelvic Pain. Not Present- Frequency, Nocturia, Painful Urination and Urgency. Musculoskeletal  Present- Back Pain, Joint Pain, Joint Stiffness and Muscle Pain. Not Present- Muscle Weakness and Swelling of Extremities. Neurological Present- Headaches. Not Present- Decreased Memory, Fainting, Numbness, Seizures, Tingling, Tremor, Trouble walking and Weakness. Psychiatric Not Present-  Anxiety, Bipolar, Change in Sleep Pattern, Depression, Fearful and Frequent crying. Endocrine Present- Hair Changes. Not Present- Cold Intolerance, Excessive Hunger, Heat Intolerance, Hot flashes and New Diabetes. Hematology Not Present- Blood Thinners, Easy Bruising, Excessive bleeding, Gland problems, HIV and Persistent Infections. All other systems negative  Vitals Andee Poles Gerrigner CMA; 05/17/2017 10:45 AM) 05/17/2017 10:45 AM Weight: 215.25 lb Height: 66in Body Surface Area: 2.06 m Body Mass Index: 34.74 kg/m  Temp.: 98.13F(Oral)  Pulse: 127 (Regular)  BP: 136/100 (Sitting, Left Arm, Standard)       Physical Exam Lindsay Ok MD; 05/17/2017 11:06 AM) The physical exam findings are as follows: Note:Constitutional: No acute distress, conversant, appears stated age  Eyes: Anicteric sclerae, moist conjunctiva, no lid lag  Neck: No thyromegaly, trachea midline, no cervical lymphadenopathy  Lungs: Clear to auscultation biilaterally, normal respiratory effot  Cardiovascular: regular rate & rhythm, no murmurs, no peripheal edema, pedal pulses 2+  GI: Soft, no masses or hepatosplenomegaly, tenderness palpation epigastric/right upper quadrant, lower midline incision, ostomy site at approximately 11 o'clock position above the umbilicus.  MSK: Normal gait, no clubbing cyanosis, edema  Skin: No rashes, palpation reveals normal skin turgor  Psychiatric: Appropriate judgment and insight, oriented to person, place, and time    Assessment & Plan Lindsay Ok MD; 05/17/2017 11:07 AM) SYMPTOMATIC CHOLELITHIASIS (K80.20) Impression: 37 year old female with symptomatic  cholelithiasis, history of [place] pouch for interstitial cystitis, chronic pain medication.  1. We will proceed to the operating room for a laparoscopic cholecystectomy  2. Risks and benefits were discussed with the patient to generally include, but not limited to: infection, bleeding, possible need for post op ERCP, damage to the bile ducts, bile leak, and possible need for further surgery. Alternatives were offered and described. All questions were answered and the patient voiced understanding of the procedure and wishes to proceed at this point with a laparoscopic cholecystectomy 3. Patient has pain contract and sees [place] pain management [place].

## 2017-05-21 DIAGNOSIS — F3176 Bipolar disorder, in full remission, most recent episode depressed: Secondary | ICD-10-CM | POA: Diagnosis not present

## 2017-05-21 DIAGNOSIS — F3111 Bipolar disorder, current episode manic without psychotic features, mild: Secondary | ICD-10-CM | POA: Diagnosis not present

## 2017-05-22 DIAGNOSIS — R339 Retention of urine, unspecified: Secondary | ICD-10-CM | POA: Diagnosis not present

## 2017-05-22 NOTE — Pre-Procedure Instructions (Signed)
[name]  05/22/2017      [id number] - [place], [place] - [address] [place] [address] Phone: [phone] Fax: [phone]  [facility] [place], [place] - [address] [place] [address] [place] [address] Phone: [phone] Fax: [phone]    Your procedure is scheduled on March 25  Report to [facility] at 1300 P.M.  Call this number if you have problems the morning of surgery:  [phone]   Remember:  Do not eat food or drink liquids after midnight.  Continue all medications as directed by your physician except follow these medication instructions before surgery below   Take these medicines the morning of surgery with A SIP OF WATER  busPIRone (BUSPAR)  cetirizine (ZYRTEC)  levonorgestrel-ethinyl estradiol  levothyroxine (SYNTHROID, LEVOTHROID) topiramate (TOPAMAX) tiZANidine (ZANAFLEX)  7 days prior to surgery STOP taking any Aspirin(unless otherwise instructed by your surgeon), Aleve, Naproxen, Ibuprofen, Motrin, Advil, Goody's, BC's, all herbal medications, fish oil, and all vitamins    Do not wear jewelry  Do not wear lotions, powders, or cologne, or deodorant.  Men may shave face and neck.  Do not bring valuables to the hospital.  [facility] is not responsible for any belongings or valuables.  Contacts, dentures or bridgework may not be worn into surgery.  Leave your suitcase in the car.  After surgery it may be brought to your room.  For patients admitted to the hospital, discharge time will be determined by your treatment team.  Patients discharged the day of surgery will not be allowed to drive home.    Special instructions:   [facility]- Preparing For Surgery  Before surgery, you can play an important role. Because skin is not sterile, your skin needs to be as free of germs as  possible. You can reduce the number of germs on your skin by washing with CHG (chlorahexidine gluconate) Soap before surgery.  CHG is an antiseptic cleaner which kills germs and bonds with the skin to continue killing germs even after washing.  Please do not use if you have an allergy to CHG or antibacterial soaps. If your skin becomes reddened/irritated stop using the CHG.  Do not shave (including legs and underarms) for at least 48 hours prior to first CHG shower. It is OK to shave your face.  Please follow these instructions carefully.   1. Shower the NIGHT BEFORE SURGERY and the MORNING OF SURGERY with CHG.   2. If you chose to wash your hair, wash your hair first as usual with your normal shampoo.  3. After you shampoo, rinse your hair and body thoroughly to remove the shampoo.  4. Use CHG as you would any other liquid soap. You can apply CHG directly to the skin and wash gently with a scrungie or a clean washcloth.   5. Apply the CHG Soap to your body ONLY FROM THE NECK DOWN.  Do not use on open wounds or open sores. Avoid contact with your eyes, ears, mouth and genitals (private parts). Wash Face and genitals (private parts)  with your normal soap.  6. Wash thoroughly, paying special attention to the area where your surgery will be performed.  7. Thoroughly rinse your body with warm water from the neck down.  8. DO NOT shower/wash with your normal soap after  using and rinsing off the CHG Soap.  9. Pat yourself dry with a CLEAN TOWEL.  10. Wear CLEAN PAJAMAS to bed the night before surgery, wear comfortable clothes the morning of surgery  11. Place CLEAN SHEETS on your bed the night of your first shower and DO NOT SLEEP WITH PETS.    Day of Surgery: Do not apply any deodorants/lotions. Please wear clean clothes to the hospital/surgery center.      Please read over the following fact sheets that you were given.

## 2017-05-23 ENCOUNTER — Encounter (HOSPITAL_COMMUNITY): Payer: Self-pay

## 2017-05-23 ENCOUNTER — Encounter (HOSPITAL_COMMUNITY)
Admission: RE | Admit: 2017-05-23 | Discharge: 2017-05-23 | Disposition: A | Discharge status: Home / Self Care | Payer: BLUE CROSS/BLUE SHIELD | Source: Ambulatory Visit | Attending: General Surgery | Admitting: General Surgery

## 2017-05-23 ENCOUNTER — Other Ambulatory Visit: Payer: Self-pay

## 2017-05-23 DIAGNOSIS — K8080 Other cholelithiasis without obstruction: Secondary | ICD-10-CM | POA: Insufficient documentation

## 2017-05-23 DIAGNOSIS — Z01818 Encounter for other preprocedural examination: Secondary | ICD-10-CM | POA: Diagnosis not present

## 2017-05-23 HISTORY — DX: Personal history of urinary calculi: Z87.442

## 2017-05-23 HISTORY — DX: Gestational diabetes mellitus in pregnancy, unspecified control: O24.419

## 2017-05-23 HISTORY — DX: Anemia, unspecified: D64.9

## 2017-05-23 LAB — BASIC METABOLIC PANEL
Anion gap: 9 (ref 5–15)
BUN: 17 mg/dL (ref 6–20)
CALCIUM: 9.4 mg/dL (ref 8.9–10.3)
CO2: 19 mmol/L — ABNORMAL LOW (ref 22–32)
Chloride: 108 mmol/L (ref 101–111)
Creatinine, Ser: 1.3 mg/dL — ABNORMAL HIGH (ref 0.44–1.00)
GFR, EST AFRICAN AMERICAN: 60 mL/min — AB (ref >60)
GFR, EST NON AFRICAN AMERICAN: 52 mL/min — AB (ref >60)
Glucose, Bld: 98 mg/dL (ref 65–99)
Potassium: 3.9 mmol/L (ref 3.5–5.1)
Sodium: 136 mmol/L (ref 135–145)

## 2017-05-23 LAB — CBC
HEMATOCRIT: 39.1 % (ref 36.0–46.0)
Hemoglobin: 12.4 g/dL (ref 12.0–15.0)
MCH: 25.8 pg — ABNORMAL LOW (ref 26.0–34.0)
MCHC: 31.7 g/dL (ref 30.0–36.0)
MCV: 81.5 fL (ref 78.0–100.0)
PLATELETS: 395 10*3/uL (ref 150–400)
RBC: 4.8 MIL/uL (ref 3.87–5.11)
RDW: 14.9 % (ref 11.5–15.5)
WBC: 9.7 10*3/uL (ref 4.0–10.5)

## 2017-05-24 MED ORDER — VANCOMYCIN HCL 10 G IV SOLR
1500.0000 mg | INTRAVENOUS | Status: AC
  Administered 2017-05-27: 1500 mg via INTRAVENOUS
  Filled 2017-05-24: qty 1500

## 2017-05-27 ENCOUNTER — Encounter (HOSPITAL_COMMUNITY): Payer: Self-pay | Admitting: *Deleted

## 2017-05-27 ENCOUNTER — Ambulatory Visit (HOSPITAL_COMMUNITY): Payer: BLUE CROSS/BLUE SHIELD | Admitting: Certified Registered Nurse Anesthetist

## 2017-05-27 ENCOUNTER — Encounter (HOSPITAL_COMMUNITY)
Admission: RE | Disposition: A | Discharge status: Home / Self Care | Payer: Self-pay | Source: Ambulatory Visit | Attending: General Surgery

## 2017-05-27 ENCOUNTER — Ambulatory Visit (HOSPITAL_COMMUNITY)
Admission: RE | Admit: 2017-05-27 | Discharge: 2017-05-27 | Disposition: A | Discharge status: Home / Self Care | Payer: BLUE CROSS/BLUE SHIELD | Source: Ambulatory Visit | Attending: General Surgery | Admitting: General Surgery

## 2017-05-27 ENCOUNTER — Other Ambulatory Visit (HOSPITAL_COMMUNITY): Payer: BLUE CROSS/BLUE SHIELD

## 2017-05-27 ENCOUNTER — Other Ambulatory Visit: Payer: Self-pay

## 2017-05-27 DIAGNOSIS — Z79891 Long term (current) use of opiate analgesic: Secondary | ICD-10-CM | POA: Diagnosis not present

## 2017-05-27 DIAGNOSIS — K8010 Calculus of gallbladder with chronic cholecystitis without obstruction: Secondary | ICD-10-CM | POA: Diagnosis not present

## 2017-05-27 DIAGNOSIS — Z936 Other artificial openings of urinary tract status: Secondary | ICD-10-CM | POA: Insufficient documentation

## 2017-05-27 DIAGNOSIS — E7800 Pure hypercholesterolemia, unspecified: Secondary | ICD-10-CM | POA: Diagnosis not present

## 2017-05-27 DIAGNOSIS — K8020 Calculus of gallbladder without cholecystitis without obstruction: Secondary | ICD-10-CM | POA: Diagnosis not present

## 2017-05-27 DIAGNOSIS — Z9049 Acquired absence of other specified parts of digestive tract: Secondary | ICD-10-CM | POA: Insufficient documentation

## 2017-05-27 DIAGNOSIS — K8080 Other cholelithiasis without obstruction: Secondary | ICD-10-CM | POA: Diagnosis not present

## 2017-05-27 DIAGNOSIS — F319 Bipolar disorder, unspecified: Secondary | ICD-10-CM | POA: Insufficient documentation

## 2017-05-27 DIAGNOSIS — Z906 Acquired absence of other parts of urinary tract: Secondary | ICD-10-CM | POA: Diagnosis not present

## 2017-05-27 DIAGNOSIS — Z79890 Hormone replacement therapy (postmenopausal): Secondary | ICD-10-CM | POA: Diagnosis not present

## 2017-05-27 DIAGNOSIS — K219 Gastro-esophageal reflux disease without esophagitis: Secondary | ICD-10-CM | POA: Insufficient documentation

## 2017-05-27 DIAGNOSIS — Z79899 Other long term (current) drug therapy: Secondary | ICD-10-CM | POA: Diagnosis not present

## 2017-05-27 DIAGNOSIS — F419 Anxiety disorder, unspecified: Secondary | ICD-10-CM | POA: Diagnosis not present

## 2017-05-27 DIAGNOSIS — M797 Fibromyalgia: Secondary | ICD-10-CM | POA: Insufficient documentation

## 2017-05-27 DIAGNOSIS — E039 Hypothyroidism, unspecified: Secondary | ICD-10-CM | POA: Diagnosis not present

## 2017-05-27 DIAGNOSIS — Z791 Long term (current) use of non-steroidal anti-inflammatories (NSAID): Secondary | ICD-10-CM | POA: Insufficient documentation

## 2017-05-27 DIAGNOSIS — K565 Intestinal adhesions [bands], unspecified as to partial versus complete obstruction: Secondary | ICD-10-CM | POA: Diagnosis not present

## 2017-05-27 HISTORY — PX: LAPAROSCOPIC LYSIS OF ADHESIONS: SHX5905

## 2017-05-27 HISTORY — PX: CHOLECYSTECTOMY: SHX55

## 2017-05-27 SURGERY — LAPAROSCOPIC CHOLECYSTECTOMY
Anesthesia: General | Site: Abdomen

## 2017-05-27 MED ORDER — IOPAMIDOL (ISOVUE-300) INJECTION 61%
INTRAVENOUS | Status: AC
  Filled 2017-05-27: qty 50

## 2017-05-27 MED ORDER — SUGAMMADEX SODIUM 200 MG/2ML IV SOLN
INTRAVENOUS | Status: DC | PRN
  Administered 2017-05-27: 200 mg via INTRAVENOUS

## 2017-05-27 MED ORDER — SODIUM CHLORIDE 0.9 % IR SOLN
Status: DC | PRN
  Administered 2017-05-27: 1

## 2017-05-27 MED ORDER — LIDOCAINE HCL (CARDIAC) 20 MG/ML IV SOLN
INTRAVENOUS | Status: AC
  Filled 2017-05-27: qty 20

## 2017-05-27 MED ORDER — CELECOXIB 200 MG PO CAPS
200.0000 mg | ORAL_CAPSULE | ORAL | Status: AC
  Administered 2017-05-27: 200 mg via ORAL

## 2017-05-27 MED ORDER — PROMETHAZINE HCL 25 MG/ML IJ SOLN
INTRAMUSCULAR | Status: AC
  Filled 2017-05-27: qty 1

## 2017-05-27 MED ORDER — ACETAMINOPHEN 500 MG PO TABS
1000.0000 mg | ORAL_TABLET | ORAL | Status: AC
  Administered 2017-05-27: 1000 mg via ORAL

## 2017-05-27 MED ORDER — PROMETHAZINE HCL 25 MG/ML IJ SOLN: 6.2500 mg | INTRAMUSCULAR | Status: DC | PRN

## 2017-05-27 MED ORDER — MIDAZOLAM HCL 2 MG/2ML IJ SOLN
INTRAMUSCULAR | Status: DC | PRN
  Administered 2017-05-27: 2 mg via INTRAVENOUS

## 2017-05-27 MED ORDER — ONDANSETRON HCL 4 MG/2ML IJ SOLN
INTRAMUSCULAR | Status: AC
  Filled 2017-05-27: qty 8

## 2017-05-27 MED ORDER — MIDAZOLAM HCL 2 MG/2ML IJ SOLN
INTRAMUSCULAR | Status: AC
  Filled 2017-05-27: qty 2

## 2017-05-27 MED ORDER — 0.9 % SODIUM CHLORIDE (POUR BTL) OPTIME
TOPICAL | Status: DC | PRN
  Administered 2017-05-27: 1000 mL

## 2017-05-27 MED ORDER — EPHEDRINE 5 MG/ML INJ
INTRAVENOUS | Status: AC
  Filled 2017-05-27: qty 10

## 2017-05-27 MED ORDER — HYDROMORPHONE HCL 1 MG/ML IJ SOLN
INTRAMUSCULAR | Status: AC
  Administered 2017-05-27: 0.5 mg via INTRAVENOUS
  Filled 2017-05-27: qty 1

## 2017-05-27 MED ORDER — DEXAMETHASONE SODIUM PHOSPHATE 10 MG/ML IJ SOLN
INTRAMUSCULAR | Status: DC | PRN
  Administered 2017-05-27: 10 mg via INTRAVENOUS

## 2017-05-27 MED ORDER — DEXAMETHASONE SODIUM PHOSPHATE 10 MG/ML IJ SOLN
INTRAMUSCULAR | Status: AC
  Filled 2017-05-27: qty 4

## 2017-05-27 MED ORDER — DEXMEDETOMIDINE HCL 200 MCG/2ML IV SOLN
INTRAVENOUS | Status: DC | PRN
  Administered 2017-05-27 (×6): 4 ug via INTRAVENOUS

## 2017-05-27 MED ORDER — HYDROMORPHONE HCL 1 MG/ML IJ SOLN
0.2500 mg | INTRAMUSCULAR | Status: DC | PRN
  Administered 2017-05-27 (×4): 0.5 mg via INTRAVENOUS

## 2017-05-27 MED ORDER — ROCURONIUM BROMIDE 10 MG/ML (PF) SYRINGE
PREFILLED_SYRINGE | INTRAVENOUS | Status: AC
  Filled 2017-05-27: qty 15

## 2017-05-27 MED ORDER — BUPIVACAINE HCL (PF) 0.25 % IJ SOLN
INTRAMUSCULAR | Status: AC
  Filled 2017-05-27: qty 30

## 2017-05-27 MED ORDER — FENTANYL CITRATE (PF) 250 MCG/5ML IJ SOLN
INTRAMUSCULAR | Status: AC
  Filled 2017-05-27: qty 5

## 2017-05-27 MED ORDER — LACTATED RINGERS IV SOLN
INTRAVENOUS | Status: DC
  Administered 2017-05-27: 14:00:00 via INTRAVENOUS

## 2017-05-27 MED ORDER — ACETAMINOPHEN 500 MG PO TABS
ORAL_TABLET | ORAL | Status: AC
  Administered 2017-05-27: 1000 mg via ORAL
  Filled 2017-05-27: qty 2

## 2017-05-27 MED ORDER — GABAPENTIN 300 MG PO CAPS
ORAL_CAPSULE | ORAL | Status: AC
  Administered 2017-05-27: 300 mg via ORAL
  Filled 2017-05-27: qty 1

## 2017-05-27 MED ORDER — DEXMEDETOMIDINE HCL IN NACL 200 MCG/50ML IV SOLN
INTRAVENOUS | Status: AC
  Filled 2017-05-27: qty 50

## 2017-05-27 MED ORDER — LIDOCAINE 2% (20 MG/ML) 5 ML SYRINGE
INTRAMUSCULAR | Status: DC | PRN
  Administered 2017-05-27: 100 mg via INTRAVENOUS

## 2017-05-27 MED ORDER — FENTANYL CITRATE (PF) 250 MCG/5ML IJ SOLN
INTRAMUSCULAR | Status: DC | PRN
  Administered 2017-05-27 (×2): 50 ug via INTRAVENOUS
  Administered 2017-05-27: 150 ug via INTRAVENOUS

## 2017-05-27 MED ORDER — CHLORHEXIDINE GLUCONATE CLOTH 2 % EX PADS: 6.0000 | MEDICATED_PAD | Freq: Once | CUTANEOUS | Status: DC

## 2017-05-27 MED ORDER — PROPOFOL 10 MG/ML IV BOLUS
INTRAVENOUS | Status: DC | PRN
  Administered 2017-05-27: 200 mg via INTRAVENOUS

## 2017-05-27 MED ORDER — CELECOXIB 200 MG PO CAPS
ORAL_CAPSULE | ORAL | Status: AC
  Administered 2017-05-27: 200 mg via ORAL
  Filled 2017-05-27: qty 1

## 2017-05-27 MED ORDER — GABAPENTIN 300 MG PO CAPS
300.0000 mg | ORAL_CAPSULE | ORAL | Status: AC
  Administered 2017-05-27: 300 mg via ORAL

## 2017-05-27 MED ORDER — PHENYLEPHRINE 40 MCG/ML (10ML) SYRINGE FOR IV PUSH (FOR BLOOD PRESSURE SUPPORT)
PREFILLED_SYRINGE | INTRAVENOUS | Status: AC
  Filled 2017-05-27: qty 10

## 2017-05-27 MED ORDER — SUCCINYLCHOLINE CHLORIDE 20 MG/ML IJ SOLN
INTRAMUSCULAR | Status: AC
  Filled 2017-05-27: qty 1

## 2017-05-27 MED ORDER — LACTATED RINGERS IV SOLN
INTRAVENOUS | Status: DC | PRN
  Administered 2017-05-27: 13:00:00 via INTRAVENOUS

## 2017-05-27 MED ORDER — PROPOFOL 10 MG/ML IV BOLUS
INTRAVENOUS | Status: AC
  Filled 2017-05-27: qty 20

## 2017-05-27 MED ORDER — KETAMINE HCL 100 MG/ML IJ SOLN
INTRAMUSCULAR | Status: DC | PRN
  Administered 2017-05-27: 50 mg via INTRAVENOUS

## 2017-05-27 MED ORDER — ONDANSETRON HCL 4 MG/2ML IJ SOLN
INTRAMUSCULAR | Status: DC | PRN
  Administered 2017-05-27: 4 mg via INTRAVENOUS

## 2017-05-27 MED ORDER — KETAMINE HCL 10 MG/ML IJ SOLN
INTRAMUSCULAR | Status: AC
  Filled 2017-05-27: qty 1

## 2017-05-27 MED ORDER — BUPIVACAINE HCL 0.25 % IJ SOLN
INTRAMUSCULAR | Status: DC | PRN
  Administered 2017-05-27: 4 mL

## 2017-05-27 MED ORDER — ROCURONIUM BROMIDE 10 MG/ML (PF) SYRINGE
PREFILLED_SYRINGE | INTRAVENOUS | Status: DC | PRN
  Administered 2017-05-27: 50 mg via INTRAVENOUS

## 2017-05-27 SURGICAL SUPPLY — 49 items
ADH SKN CLS APL DERMABOND .7 (GAUZE/BANDAGES/DRESSINGS) ×1
APL SKNCLS STERI-STRIP NONHPOA (GAUZE/BANDAGES/DRESSINGS) ×1
APPLIER CLIP 5 13 M/L LIGAMAX5 (MISCELLANEOUS)
APR CLP MED LRG 5 ANG JAW (MISCELLANEOUS)
BAG SPEC RTRVL 10 TROC 200 (ENDOMECHANICALS) ×1
BENZOIN TINCTURE PRP APPL 2/3 (GAUZE/BANDAGES/DRESSINGS) ×2 IMPLANT
CANISTER SUCT 3000ML PPV (MISCELLANEOUS) ×2 IMPLANT
CHLORAPREP W/TINT 26ML (MISCELLANEOUS) ×2 IMPLANT
CLIP APPLIE 5 13 M/L LIGAMAX5 (MISCELLANEOUS) IMPLANT
CLIP VESOLOCK MED LG 6/CT (CLIP) ×2 IMPLANT
COVER SURGICAL LIGHT HANDLE (MISCELLANEOUS) ×2 IMPLANT
COVER TRANSDUCER ULTRASND (DRAPES) ×2 IMPLANT
DERMABOND ADVANCED (GAUZE/BANDAGES/DRESSINGS) ×1
DERMABOND ADVANCED .7 DNX12 (GAUZE/BANDAGES/DRESSINGS) ×1 IMPLANT
ELECT REM PT RETURN 9FT ADLT (ELECTROSURGICAL) ×2
ELECTRODE REM PT RTRN 9FT ADLT (ELECTROSURGICAL) ×1 IMPLANT
GAUZE SPONGE 2X2 8PLY STRL LF (GAUZE/BANDAGES/DRESSINGS) ×1 IMPLANT
GLOVE BIO SURGEON STRL SZ7.5 (GLOVE) ×2 IMPLANT
GLOVE BIOGEL PI IND STRL 7.0 (GLOVE) ×3 IMPLANT
GLOVE BIOGEL PI INDICATOR 7.0 (GLOVE) ×3
GLOVE SURG SS PI 6.5 STRL IVOR (GLOVE) ×6 IMPLANT
GOWN STRL REUS W/ TWL LRG LVL3 (GOWN DISPOSABLE) ×2 IMPLANT
GOWN STRL REUS W/ TWL XL LVL3 (GOWN DISPOSABLE) ×1 IMPLANT
GOWN STRL REUS W/TWL LRG LVL3 (GOWN DISPOSABLE) ×4
GOWN STRL REUS W/TWL XL LVL3 (GOWN DISPOSABLE) ×2
GRASPER SUT TROCAR 14GX15 (MISCELLANEOUS) ×2 IMPLANT
KIT BASIN OR (CUSTOM PROCEDURE TRAY) ×2 IMPLANT
KIT ROOM TURNOVER OR (KITS) ×2 IMPLANT
NEEDLE INSUFFLATION 14GA 120MM (NEEDLE) ×2 IMPLANT
NS IRRIG 1000ML POUR BTL (IV SOLUTION) ×2 IMPLANT
PAD ARMBOARD 7.5X6 YLW CONV (MISCELLANEOUS) ×4 IMPLANT
POUCH RETRIEVAL ECOSAC 10 (ENDOMECHANICALS) ×1 IMPLANT
POUCH RETRIEVAL ECOSAC 10MM (ENDOMECHANICALS) ×1
SCISSORS LAP 5X35 DISP (ENDOMECHANICALS) ×2 IMPLANT
SET IRRIG TUBING LAPAROSCOPIC (IRRIGATION / IRRIGATOR) ×2 IMPLANT
SHEARS HARMONIC ACE PLUS 36CM (ENDOMECHANICALS) IMPLANT
SLEEVE ENDOPATH XCEL 5M (ENDOMECHANICALS) ×2 IMPLANT
SPECIMEN JAR SMALL (MISCELLANEOUS) ×2 IMPLANT
SPONGE GAUZE 2X2 STER 10/PKG (GAUZE/BANDAGES/DRESSINGS) ×1
SUT MNCRL AB 4-0 PS2 18 (SUTURE) ×2 IMPLANT
SYRINGE IRR TOOMEY STRL 70CC (SYRINGE) ×2 IMPLANT
TOWEL OR 17X24 6PK STRL BLUE (TOWEL DISPOSABLE) ×2 IMPLANT
TOWEL OR 17X26 10 PK STRL BLUE (TOWEL DISPOSABLE) ×2 IMPLANT
TRAY FOLEY CATH SILVER 14FR (SET/KITS/TRAYS/PACK) ×2 IMPLANT
TRAY LAPAROSCOPIC MC (CUSTOM PROCEDURE TRAY) ×2 IMPLANT
TROCAR XCEL NON-BLD 11X100MML (ENDOMECHANICALS) ×2 IMPLANT
TROCAR XCEL NON-BLD 5MMX100MML (ENDOMECHANICALS) ×2 IMPLANT
TUBING INSUFFLATION (TUBING) ×2 IMPLANT
WATER STERILE IRR 1000ML POUR (IV SOLUTION) ×2 IMPLANT

## 2017-05-27 NOTE — Op Note (Signed)
05/27/2017  3:18 PM  PATIENT:  [name]  37 y.o. female  PRE-OPERATIVE DIAGNOSIS:  gallstones  POST-OPERATIVE DIAGNOSIS:  gallstones  PROCEDURE:  Procedure(s): LAPAROSCOPIC CHOLECYSTECTOMY (N/A) LAPAROSCOPIC LYSIS OF ADHESIONS (N/A)  SURGEON:  Surgeon(s) and Role:    * Ralene Ok, MD - Primary   ANESTHESIA:   local and general  EBL:  minimal   BLOOD ADMINISTERED:none  DRAINS: none   LOCAL MEDICATIONS USED:  BUPIVICAINE   SPECIMEN:  Source of Specimen:  gallbladder  DISPOSITION OF SPECIMEN:  PATHOLOGY  COUNTS:  YES  TOURNIQUET:  * No tourniquets in log *  DICTATION: .Dragon Dictation  The patient was taken to the operating and placed in the supine position with bilateral SCDs in place. The patient was prepped and draped in the usual sterile fashion. A time out was called and all facts were verified. I attempted to drain the [place] pouch with a 14Fr foley catheter and large syringe, but there was no return after several attempt.   A pneumoperitoneum was obtained via A Veress needle technique to a pressure of 63mm of mercury.  A 63mm trochar was then placed in the left upper quadrant under visualization, and there were no injuries to any abdominal organs. There were some thin omental adhesions to the anterior abdominal wall.  Some of these were taken down from the epigastrum to make way for another working port.  A 5 mm port was then placed in the epigastrum after infiltrating with local anesthesia under direct visualization.  At this time the thin adhesion to the upper anterior abdominal wall were taken down. I was careful to stay away from any of the small/large intestine.  Once this was clearly taken down to place a trochar near the left mid adbomen, I placed a 2mm trocar under direct visualization.  A third 94mm trocar was also placed in the right mid quadrant area to help with retraction.     The gallbladder was identified and retracted, the peritoneum  was then sharply dissected from the gallbladder and this dissection was carried down to Calot's triangle. The gallbladder was identified and stripped away circumferentially and seen going into the gallbladder 360, the critical angle was obtained.  2 clips were placed proximally one distally and the cystic duct transected. The cystic artery was identified and 2 clips placed proximally and one distally and transected. We then proceeded to remove the gallbladder off the hepatic fossa with Bovie cautery. A retrieval bag was then placed in the abdomen and gallbladder placed in the bag. The hepatic fossa was then reexamined and hemostasis was achieved with Bovie cautery and was excellent at the end of the case. The subhepatic fossa and perihepatic fossa was then irrigated until the effluent was clear.  The gallbladder and bag were removed from the abdominal cavity. The 11 mm trocar fascia was reapproximated with the Endo Close #1 Vicryl x2. The pneumoperitoneum was evacuated and all trochars removed under direct visulalization. The skin was then closed with 4-0 Monocryl and the skin dressed with Steri-Strips, gauze, and tape. The patient was awaken from general anesthesia and taken to the recovery room in stable condition.   PLAN OF CARE: Discharge to home after PACU  PATIENT DISPOSITION:  PACU - hemodynamically stable.   Delay start of Pharmacological VTE agent (>24hrs) due to surgical blood loss or risk of bleeding: not applicable

## 2017-05-27 NOTE — Transfer of Care (Signed)
Immediate Anesthesia Transfer of Care Note  Patient: [name]  Procedure(s) Performed: LAPAROSCOPIC CHOLECYSTECTOMY (N/A Abdomen) LAPAROSCOPIC LYSIS OF ADHESIONS (N/A Abdomen)  Patient Location: PACU  Anesthesia Type:General  Level of Consciousness: drowsy  Airway & Oxygen Therapy: Patient Spontanous Breathing and Patient connected to nasal cannula oxygen  Post-op Assessment: Report given to RN and Post -op Vital signs reviewed and stable  Post vital signs: Reviewed and stable  Last Vitals:  Vitals Value Taken Time  BP 113/64 05/27/2017  3:26 PM  Temp    Pulse 82 05/27/2017  3:27 PM  Resp 14 05/27/2017  3:27 PM  SpO2 100 % 05/27/2017  3:27 PM  Vitals shown include unvalidated device data.  Last Pain:  Vitals:   05/27/17 1320  TempSrc:   PainSc: 7       Patients Stated Pain Goal: 3 (77/93/90 3009)  Complications: No apparent anesthesia complications

## 2017-05-27 NOTE — Interval H&P Note (Signed)
History and Physical Interval Note:  05/27/2017 1:45 PM  [name]  has presented today for surgery, with the diagnosis of gallstones  The various methods of treatment have been discussed with the patient and family. After consideration of risks, benefits and other options for treatment, the patient has consented to  Procedure(s): LAPAROSCOPIC CHOLECYSTECTOMY (N/A) LAPAROSCOPIC LYSIS OF ADHESIONS (N/A) as a surgical intervention .  The patient's history has been reviewed, patient examined, no change in status, stable for surgery.  I have reviewed the patient's chart and labs.  Questions were answered to the patient's satisfaction.     Rosario Jacks., Anne Hahn

## 2017-05-27 NOTE — Anesthesia Postprocedure Evaluation (Signed)
Anesthesia Post Note  Patient: [name]  Procedure(s) Performed: LAPAROSCOPIC CHOLECYSTECTOMY (N/A Abdomen) LAPAROSCOPIC LYSIS OF ADHESIONS (N/A Abdomen)     Patient location during evaluation: PACU Anesthesia Type: General Level of consciousness: awake and alert Pain management: pain level controlled Vital Signs Assessment: post-procedure vital signs reviewed and stable Respiratory status: spontaneous breathing, nonlabored ventilation, respiratory function stable and patient connected to nasal cannula oxygen Cardiovascular status: blood pressure returned to baseline and stable Postop Assessment: no apparent nausea or vomiting Anesthetic complications: no    Last Vitals:  Vitals:   05/27/17 1308 05/27/17 1525  BP: (!) 141/89 113/64  Pulse: 92 85  Resp: 20 (!) 24  Temp: 36.5 C 36.5 C  SpO2: 99% 100%    Last Pain:  Vitals:   05/27/17 1550  TempSrc:   PainSc: 8                  Tabari Volkert DAVID

## 2017-05-27 NOTE — Anesthesia Preprocedure Evaluation (Signed)
Anesthesia Evaluation  Patient identified by MRN, date of birth, ID band Patient awake    Reviewed: Allergy & Precautions, NPO status , Patient's Chart, lab work & pertinent test results  Airway Mallampati: II  TM Distance: >3 FB Neck ROM: Full    Dental  (+) Dental Advisory Given   Pulmonary neg pulmonary ROS,    breath sounds clear to auscultation       Cardiovascular negative cardio ROS   Rhythm:Regular Rate:Normal     Neuro/Psych  Headaches, Anxiety Depression Bipolar Disorder    GI/Hepatic Neg liver ROS, GERD  ,  Endo/Other  diabetesHypothyroidism   Renal/GU Renal InsufficiencyRenal disease     Musculoskeletal  (+) Fibromyalgia -  Abdominal   Peds  Hematology  (+) anemia ,   Anesthesia Other Findings   Reproductive/Obstetrics                             Lab Results  Component Value Date   WBC 9.7 05/23/2017   HGB 12.4 05/23/2017   HCT 39.1 05/23/2017   MCV 81.5 05/23/2017   PLT 395 05/23/2017   Lab Results  Component Value Date   CREATININE 1.30 (H) 05/23/2017   BUN 17 05/23/2017   NA 136 05/23/2017   K 3.9 05/23/2017   CL 108 05/23/2017   CO2 19 (L) 05/23/2017    Anesthesia Physical Anesthesia Plan  ASA: II  Anesthesia Plan: General   Post-op Pain Management:    Induction: Intravenous  PONV Risk Score and Plan: 3 and Midazolam, Dexamethasone, Ondansetron and Treatment may vary due to age or medical condition  Airway Management Planned: Oral ETT  Additional Equipment:   Intra-op Plan:   Post-operative Plan: Extubation in OR  Informed Consent: I have reviewed the patients History and Physical, chart, labs and discussed the procedure including the risks, benefits and alternatives for the proposed anesthesia with the patient or authorized representative who has indicated his/her understanding and acceptance.   Dental advisory given  Plan Discussed with:  CRNA  Anesthesia Plan Comments:         Anesthesia Quick Evaluation

## 2017-05-27 NOTE — Anesthesia Procedure Notes (Signed)
Procedure Name: Intubation Date/Time: 05/27/2017 2:22 PM Performed by: Bryson Corona, CRNA Pre-anesthesia Checklist: Patient identified, Emergency Drugs available, Suction available and Patient being monitored Patient Re-evaluated:Patient Re-evaluated prior to induction Oxygen Delivery Method: Circle System Utilized Preoxygenation: Pre-oxygenation with 100% oxygen Induction Type: IV induction Ventilation: Mask ventilation without difficulty Laryngoscope Size: Mac and 3 Grade View: Grade I Tube type: Oral Tube size: 7.0 mm Number of attempts: 1 Airway Equipment and Method: Stylet and Oral airway Placement Confirmation: ETT inserted through vocal cords under direct vision,  positive ETCO2 and breath sounds checked- equal and bilateral Secured at: 22 cm Tube secured with: Tape Dental Injury: Teeth and Oropharynx as per pre-operative assessment

## 2017-05-27 NOTE — Discharge Instructions (Addendum)
[facility] ______[facility] °LAPAROSCOPIC SURGERY: POST OP INSTRUCTIONS °Always review your discharge instruction sheet given to you by the facility where your surgery was performed. °IF YOU HAVE DISABILITY OR FAMILY LEAVE FORMS, YOU MUST BRING THEM TO THE OFFICE FOR PROCESSING.   °DO NOT GIVE THEM TO YOUR DOCTOR. ° °1. A prescription for pain medication may be given to you upon discharge.  Take your pain medication as prescribed, if needed.  If narcotic pain medicine is not needed, then you may take acetaminophen (Tylenol) or ibuprofen (Advil) as needed. °2. Take your usually prescribed medications unless otherwise directed. °3. If you need a refill on your pain medication, please contact your pharmacy.  They will contact our office to request authorization. Prescriptions will not be filled after 5pm or on week-ends. °4. You should follow a light diet the first few days after arrival home, such as soup and crackers, etc.  Be sure to include lots of fluids daily. °5. Most patients will experience some swelling and bruising in the area of the incisions.  Ice packs will help.  Swelling and bruising can take several days to resolve.  °6. It is common to experience some constipation if taking pain medication after surgery.  Increasing fluid intake and taking a stool softener (such as Colace) will usually help or prevent this problem from occurring.  A mild laxative (Milk of Magnesia or Miralax) should be taken according to package instructions if there are no bowel movements after 48 hours. °7. Unless discharge instructions indicate otherwise, you may remove your bandages 24-48 hours after surgery, and you may shower at that time.  You may have steri-strips (small skin tapes) in place directly over the incision.  These strips should be left on the skin for 7-10 days.  If your surgeon used skin glue on the incision, you may shower in 24 hours.  The glue will flake off over the next 2-3 weeks.  Any sutures or  staples will be removed at the office during your follow-up visit. °8. ACTIVITIES:  You may resume regular (light) daily activities beginning the next day--such as daily self-care, walking, climbing stairs--gradually increasing activities as tolerated.  You may have sexual intercourse when it is comfortable.  Refrain from any heavy lifting or straining until approved by your doctor. °a. You may drive when you are no longer taking prescription pain medication, you can comfortably wear a seatbelt, and you can safely maneuver your car and apply brakes. °b. RETURN TO WORK:  __________________________________________________________ °9. You should see your doctor in the office for a follow-up appointment approximately 2-3 weeks after your surgery.  Make sure that you call for this appointment within a day or two after you arrive home to insure a convenient appointment time. °10. OTHER INSTRUCTIONS: __________________________________________________________________________________________________________________________ __________________________________________________________________________________________________________________________ °WHEN TO CALL YOUR DOCTOR: °1. Fever over 101.0 °2. Inability to urinate °3. Continued bleeding from incision. °4. Increased pain, redness, or drainage from the incision. °5. Increasing abdominal pain ° °The clinic staff is available to answer your questions during regular business hours.  Please don’t hesitate to call and ask to speak to one of the nurses for clinical concerns.  If you have a medical emergency, go to the nearest emergency room or call 911.  A surgeon from [facility] is always on call at the hospital. °[address], [address], [place], [place]  [address] ? [address] [address], [place], [place]   [address] °[phone] ? [phone] ? FAX [phone] °Web site:   [email or web address] °

## 2017-05-28 ENCOUNTER — Encounter (HOSPITAL_COMMUNITY): Payer: Self-pay | Admitting: General Surgery

## 2017-06-12 ENCOUNTER — Ambulatory Visit: Payer: BLUE CROSS/BLUE SHIELD | Admitting: Rheumatology

## 2017-06-13 DIAGNOSIS — M25511 Pain in right shoulder: Secondary | ICD-10-CM | POA: Diagnosis not present

## 2017-06-13 DIAGNOSIS — R102 Pelvic and perineal pain: Secondary | ICD-10-CM | POA: Diagnosis not present

## 2017-06-13 DIAGNOSIS — M545 Low back pain: Secondary | ICD-10-CM | POA: Diagnosis not present

## 2017-06-13 DIAGNOSIS — G894 Chronic pain syndrome: Secondary | ICD-10-CM | POA: Diagnosis not present

## 2017-07-01 ENCOUNTER — Ambulatory Visit: Payer: BLUE CROSS/BLUE SHIELD | Admitting: Gastroenterology

## 2017-07-01 ENCOUNTER — Encounter

## 2017-07-09 ENCOUNTER — Ambulatory Visit: Payer: BLUE CROSS/BLUE SHIELD | Admitting: Gastroenterology

## 2017-07-22 DIAGNOSIS — R339 Retention of urine, unspecified: Secondary | ICD-10-CM | POA: Diagnosis not present

## 2017-08-06 DIAGNOSIS — D72829 Elevated white blood cell count, unspecified: Secondary | ICD-10-CM | POA: Diagnosis not present

## 2017-08-09 DIAGNOSIS — K8020 Calculus of gallbladder without cholecystitis without obstruction: Secondary | ICD-10-CM | POA: Diagnosis not present

## 2017-08-09 DIAGNOSIS — G8929 Other chronic pain: Secondary | ICD-10-CM | POA: Diagnosis not present

## 2017-08-09 DIAGNOSIS — D508 Other iron deficiency anemias: Secondary | ICD-10-CM | POA: Diagnosis not present

## 2017-08-09 DIAGNOSIS — E781 Pure hyperglyceridemia: Secondary | ICD-10-CM | POA: Diagnosis not present

## 2017-08-14 DIAGNOSIS — G894 Chronic pain syndrome: Secondary | ICD-10-CM | POA: Diagnosis not present

## 2017-08-14 DIAGNOSIS — M545 Low back pain: Secondary | ICD-10-CM | POA: Diagnosis not present

## 2017-08-14 DIAGNOSIS — M25511 Pain in right shoulder: Secondary | ICD-10-CM | POA: Diagnosis not present

## 2017-08-14 DIAGNOSIS — M792 Neuralgia and neuritis, unspecified: Secondary | ICD-10-CM | POA: Diagnosis not present

## 2017-08-16 ENCOUNTER — Other Ambulatory Visit (HOSPITAL_COMMUNITY): Payer: Self-pay | Admitting: *Deleted

## 2017-08-19 ENCOUNTER — Ambulatory Visit (HOSPITAL_COMMUNITY)
Admission: RE | Admit: 2017-08-19 | Discharge: 2017-08-19 | Disposition: A | Discharge status: Home / Self Care | Payer: BLUE CROSS/BLUE SHIELD | Source: Ambulatory Visit | Attending: Internal Medicine | Admitting: Internal Medicine

## 2017-08-19 DIAGNOSIS — D649 Anemia, unspecified: Secondary | ICD-10-CM | POA: Diagnosis not present

## 2017-08-19 MED ORDER — SODIUM CHLORIDE 0.9 % IV SOLN
510.0000 mg | INTRAVENOUS | Status: DC
  Administered 2017-08-19: 510 mg via INTRAVENOUS
  Filled 2017-08-19: qty 17

## 2017-08-22 DIAGNOSIS — Z906 Acquired absence of other parts of urinary tract: Secondary | ICD-10-CM | POA: Diagnosis not present

## 2017-08-22 DIAGNOSIS — Q631 Lobulated, fused and horseshoe kidney: Secondary | ICD-10-CM | POA: Diagnosis not present

## 2017-08-22 DIAGNOSIS — M797 Fibromyalgia: Secondary | ICD-10-CM | POA: Diagnosis not present

## 2017-08-22 DIAGNOSIS — N3011 Interstitial cystitis (chronic) with hematuria: Secondary | ICD-10-CM | POA: Diagnosis not present

## 2017-08-26 ENCOUNTER — Ambulatory Visit (HOSPITAL_COMMUNITY)
Admission: RE | Admit: 2017-08-26 | Discharge: 2017-08-26 | Disposition: A | Discharge status: Home / Self Care | Payer: BLUE CROSS/BLUE SHIELD | Source: Ambulatory Visit | Attending: Internal Medicine | Admitting: Internal Medicine

## 2017-08-26 DIAGNOSIS — F3176 Bipolar disorder, in full remission, most recent episode depressed: Secondary | ICD-10-CM | POA: Diagnosis not present

## 2017-08-26 DIAGNOSIS — F3174 Bipolar disorder, in full remission, most recent episode manic: Secondary | ICD-10-CM | POA: Diagnosis not present

## 2017-08-26 DIAGNOSIS — D649 Anemia, unspecified: Secondary | ICD-10-CM | POA: Diagnosis not present

## 2017-08-26 DIAGNOSIS — F900 Attention-deficit hyperactivity disorder, predominantly inattentive type: Secondary | ICD-10-CM | POA: Diagnosis not present

## 2017-08-26 MED ORDER — SODIUM CHLORIDE 0.9 % IV SOLN
510.0000 mg | INTRAVENOUS | Status: DC
  Administered 2017-08-26: 510 mg via INTRAVENOUS
  Filled 2017-08-26: qty 17

## 2017-09-03 DIAGNOSIS — D509 Iron deficiency anemia, unspecified: Secondary | ICD-10-CM | POA: Diagnosis not present

## 2017-09-03 DIAGNOSIS — D72829 Elevated white blood cell count, unspecified: Secondary | ICD-10-CM | POA: Diagnosis not present

## 2017-09-03 DIAGNOSIS — E038 Other specified hypothyroidism: Secondary | ICD-10-CM | POA: Diagnosis not present

## 2017-09-03 DIAGNOSIS — R5383 Other fatigue: Secondary | ICD-10-CM | POA: Diagnosis not present

## 2017-09-06 ENCOUNTER — Ambulatory Visit: Payer: BLUE CROSS/BLUE SHIELD | Admitting: Gastroenterology

## 2017-10-08 DIAGNOSIS — M25511 Pain in right shoulder: Secondary | ICD-10-CM | POA: Diagnosis not present

## 2017-10-08 DIAGNOSIS — Z5181 Encounter for therapeutic drug level monitoring: Secondary | ICD-10-CM | POA: Diagnosis not present

## 2017-10-08 DIAGNOSIS — G894 Chronic pain syndrome: Secondary | ICD-10-CM | POA: Diagnosis not present

## 2017-10-08 DIAGNOSIS — M545 Low back pain: Secondary | ICD-10-CM | POA: Diagnosis not present

## 2017-10-08 DIAGNOSIS — R102 Pelvic and perineal pain: Secondary | ICD-10-CM | POA: Diagnosis not present

## 2017-10-08 DIAGNOSIS — Z79899 Other long term (current) drug therapy: Secondary | ICD-10-CM | POA: Diagnosis not present

## 2017-10-09 DIAGNOSIS — R339 Retention of urine, unspecified: Secondary | ICD-10-CM | POA: Diagnosis not present

## 2017-10-10 DIAGNOSIS — E876 Hypokalemia: Secondary | ICD-10-CM | POA: Diagnosis not present

## 2017-10-25 ENCOUNTER — Ambulatory Visit: Payer: BLUE CROSS/BLUE SHIELD | Admitting: Gastroenterology

## 2017-11-08 DIAGNOSIS — R339 Retention of urine, unspecified: Secondary | ICD-10-CM | POA: Diagnosis not present

## 2017-11-27 DIAGNOSIS — M549 Dorsalgia, unspecified: Secondary | ICD-10-CM | POA: Diagnosis not present

## 2017-11-27 DIAGNOSIS — Z79899 Other long term (current) drug therapy: Secondary | ICD-10-CM | POA: Diagnosis not present

## 2017-11-27 DIAGNOSIS — F909 Attention-deficit hyperactivity disorder, unspecified type: Secondary | ICD-10-CM | POA: Diagnosis not present

## 2017-11-27 DIAGNOSIS — M797 Fibromyalgia: Secondary | ICD-10-CM | POA: Diagnosis not present

## 2017-11-27 DIAGNOSIS — G894 Chronic pain syndrome: Secondary | ICD-10-CM | POA: Diagnosis not present

## 2017-11-27 DIAGNOSIS — J309 Allergic rhinitis, unspecified: Secondary | ICD-10-CM | POA: Diagnosis not present

## 2017-11-27 DIAGNOSIS — G8929 Other chronic pain: Secondary | ICD-10-CM | POA: Diagnosis not present

## 2017-11-27 DIAGNOSIS — N3010 Interstitial cystitis (chronic) without hematuria: Secondary | ICD-10-CM | POA: Diagnosis not present

## 2017-11-27 DIAGNOSIS — M545 Low back pain: Secondary | ICD-10-CM | POA: Diagnosis not present

## 2017-11-27 DIAGNOSIS — Z906 Acquired absence of other parts of urinary tract: Secondary | ICD-10-CM | POA: Diagnosis not present

## 2017-11-27 DIAGNOSIS — E039 Hypothyroidism, unspecified: Secondary | ICD-10-CM | POA: Diagnosis not present

## 2017-11-27 DIAGNOSIS — F419 Anxiety disorder, unspecified: Secondary | ICD-10-CM | POA: Diagnosis not present

## 2017-11-27 DIAGNOSIS — Z79891 Long term (current) use of opiate analgesic: Secondary | ICD-10-CM | POA: Diagnosis not present

## 2018-02-10 DIAGNOSIS — Z79899 Other long term (current) drug therapy: Secondary | ICD-10-CM | POA: Diagnosis not present

## 2018-02-10 DIAGNOSIS — Z5181 Encounter for therapeutic drug level monitoring: Secondary | ICD-10-CM | POA: Diagnosis not present

## 2018-02-10 DIAGNOSIS — G894 Chronic pain syndrome: Secondary | ICD-10-CM | POA: Diagnosis not present

## 2018-02-10 DIAGNOSIS — M792 Neuralgia and neuritis, unspecified: Secondary | ICD-10-CM | POA: Diagnosis not present

## 2018-02-10 DIAGNOSIS — M7918 Myalgia, other site: Secondary | ICD-10-CM | POA: Diagnosis not present

## 2018-02-10 DIAGNOSIS — M25511 Pain in right shoulder: Secondary | ICD-10-CM | POA: Diagnosis not present

## 2018-02-12 DIAGNOSIS — R339 Retention of urine, unspecified: Secondary | ICD-10-CM | POA: Diagnosis not present

## 2018-02-17 DIAGNOSIS — F410 Panic disorder [episodic paroxysmal anxiety] without agoraphobia: Secondary | ICD-10-CM | POA: Diagnosis not present

## 2018-02-17 DIAGNOSIS — F3174 Bipolar disorder, in full remission, most recent episode manic: Secondary | ICD-10-CM | POA: Diagnosis not present

## 2018-02-17 DIAGNOSIS — F3176 Bipolar disorder, in full remission, most recent episode depressed: Secondary | ICD-10-CM | POA: Diagnosis not present

## 2018-03-11 DIAGNOSIS — Z0000 Encounter for general adult medical examination without abnormal findings: Secondary | ICD-10-CM | POA: Diagnosis not present

## 2018-03-11 DIAGNOSIS — E559 Vitamin D deficiency, unspecified: Secondary | ICD-10-CM | POA: Diagnosis not present

## 2018-03-11 DIAGNOSIS — E039 Hypothyroidism, unspecified: Secondary | ICD-10-CM | POA: Diagnosis not present

## 2018-03-11 DIAGNOSIS — R5383 Other fatigue: Secondary | ICD-10-CM | POA: Diagnosis not present

## 2018-03-13 DIAGNOSIS — E781 Pure hyperglyceridemia: Secondary | ICD-10-CM | POA: Diagnosis not present

## 2018-03-13 DIAGNOSIS — Z1389 Encounter for screening for other disorder: Secondary | ICD-10-CM | POA: Diagnosis not present

## 2018-03-13 DIAGNOSIS — N3010 Interstitial cystitis (chronic) without hematuria: Secondary | ICD-10-CM | POA: Diagnosis not present

## 2018-03-13 DIAGNOSIS — Z0000 Encounter for general adult medical examination without abnormal findings: Secondary | ICD-10-CM | POA: Diagnosis not present

## 2018-03-13 DIAGNOSIS — F329 Major depressive disorder, single episode, unspecified: Secondary | ICD-10-CM | POA: Diagnosis not present

## 2018-03-13 DIAGNOSIS — G8929 Other chronic pain: Secondary | ICD-10-CM | POA: Diagnosis not present

## 2018-03-26 ENCOUNTER — Telehealth: Payer: Self-pay

## 2018-03-26 NOTE — Telephone Encounter (Signed)
Per request from Dr. Havery Moros, called pt to schedule a F/U appt. LM for her to call back to schedule and OV.

## 2018-03-28 NOTE — Telephone Encounter (Signed)
Patient scheduled appointment with Dr Havery Moros for 04/25/2018

## 2018-04-10 DIAGNOSIS — G894 Chronic pain syndrome: Secondary | ICD-10-CM | POA: Diagnosis not present

## 2018-04-10 DIAGNOSIS — R102 Pelvic and perineal pain: Secondary | ICD-10-CM | POA: Diagnosis not present

## 2018-04-10 DIAGNOSIS — M25511 Pain in right shoulder: Secondary | ICD-10-CM | POA: Diagnosis not present

## 2018-04-25 ENCOUNTER — Ambulatory Visit: Payer: BLUE CROSS/BLUE SHIELD | Admitting: Gastroenterology

## 2018-06-09 DIAGNOSIS — G894 Chronic pain syndrome: Secondary | ICD-10-CM | POA: Diagnosis not present

## 2018-07-07 DIAGNOSIS — G894 Chronic pain syndrome: Secondary | ICD-10-CM | POA: Diagnosis not present

## 2019-08-31 ENCOUNTER — Other Ambulatory Visit: Payer: Self-pay | Admitting: Obstetrics & Gynecology

## 2019-08-31 DIAGNOSIS — Z1231 Encounter for screening mammogram for malignant neoplasm of breast: Secondary | ICD-10-CM

## 2019-09-29 ENCOUNTER — Ambulatory Visit: Payer: Self-pay

## 2019-09-30 ENCOUNTER — Ambulatory Visit
Admission: RE | Admit: 2019-09-30 | Discharge: 2019-09-30 | Disposition: A | Discharge status: Home / Self Care | Payer: 59 | Source: Ambulatory Visit | Attending: Obstetrics & Gynecology | Admitting: Obstetrics & Gynecology

## 2019-09-30 DIAGNOSIS — Z1231 Encounter for screening mammogram for malignant neoplasm of breast: Secondary | ICD-10-CM

## 2019-10-05 ENCOUNTER — Other Ambulatory Visit: Payer: Self-pay | Admitting: Obstetrics & Gynecology

## 2019-10-05 DIAGNOSIS — R928 Other abnormal and inconclusive findings on diagnostic imaging of breast: Secondary | ICD-10-CM

## 2019-10-09 ENCOUNTER — Other Ambulatory Visit (HOSPITAL_COMMUNITY): Payer: Self-pay | Admitting: *Deleted

## 2019-10-12 ENCOUNTER — Encounter (HOSPITAL_COMMUNITY)
Admission: RE | Admit: 2019-10-12 | Discharge: 2019-10-12 | Disposition: A | Discharge status: Home / Self Care | Payer: 59 | Source: Ambulatory Visit | Attending: Internal Medicine | Admitting: Internal Medicine

## 2019-10-12 ENCOUNTER — Other Ambulatory Visit: Payer: Self-pay

## 2019-10-12 DIAGNOSIS — D649 Anemia, unspecified: Secondary | ICD-10-CM | POA: Insufficient documentation

## 2019-10-12 MED ORDER — SODIUM CHLORIDE 0.9 % IV SOLN
510.0000 mg | INTRAVENOUS | Status: DC
  Administered 2019-10-12: 510 mg via INTRAVENOUS
  Filled 2019-10-12: qty 17

## 2019-10-14 ENCOUNTER — Other Ambulatory Visit: Payer: Self-pay

## 2019-10-14 ENCOUNTER — Other Ambulatory Visit: Payer: Self-pay | Admitting: Obstetrics & Gynecology

## 2019-10-14 ENCOUNTER — Ambulatory Visit
Admission: RE | Admit: 2019-10-14 | Discharge: 2019-10-14 | Disposition: A | Discharge status: Home / Self Care | Payer: 59 | Source: Ambulatory Visit | Attending: Obstetrics & Gynecology | Admitting: Obstetrics & Gynecology

## 2019-10-14 DIAGNOSIS — R928 Other abnormal and inconclusive findings on diagnostic imaging of breast: Secondary | ICD-10-CM

## 2019-10-14 DIAGNOSIS — N6320 Unspecified lump in the left breast, unspecified quadrant: Secondary | ICD-10-CM

## 2019-10-19 ENCOUNTER — Encounter (HOSPITAL_COMMUNITY)
Admission: RE | Admit: 2019-10-19 | Discharge: 2019-10-19 | Disposition: A | Discharge status: Home / Self Care | Payer: 59 | Source: Ambulatory Visit | Attending: Internal Medicine | Admitting: Internal Medicine

## 2019-10-19 ENCOUNTER — Other Ambulatory Visit: Payer: Self-pay

## 2019-10-19 DIAGNOSIS — D649 Anemia, unspecified: Secondary | ICD-10-CM | POA: Diagnosis not present

## 2019-10-19 MED ORDER — SODIUM CHLORIDE 0.9 % IV SOLN
510.0000 mg | INTRAVENOUS | Status: AC
  Administered 2019-10-19: 510 mg via INTRAVENOUS
  Filled 2019-10-19: qty 17

## 2019-10-26 ENCOUNTER — Other Ambulatory Visit: Payer: Self-pay

## 2019-10-26 ENCOUNTER — Ambulatory Visit
Admission: RE | Admit: 2019-10-26 | Discharge: 2019-10-26 | Disposition: A | Discharge status: Home / Self Care | Payer: 59 | Source: Ambulatory Visit | Attending: Obstetrics & Gynecology | Admitting: Obstetrics & Gynecology

## 2019-10-26 DIAGNOSIS — N6320 Unspecified lump in the left breast, unspecified quadrant: Secondary | ICD-10-CM

## 2020-04-11 DIAGNOSIS — N393 Stress incontinence (female) (male): Secondary | ICD-10-CM | POA: Insufficient documentation

## 2020-04-15 DIAGNOSIS — D509 Iron deficiency anemia, unspecified: Secondary | ICD-10-CM | POA: Diagnosis not present

## 2020-04-15 DIAGNOSIS — R109 Unspecified abdominal pain: Secondary | ICD-10-CM | POA: Diagnosis not present

## 2020-05-03 ENCOUNTER — Other Ambulatory Visit (HOSPITAL_COMMUNITY): Payer: Self-pay

## 2020-05-04 ENCOUNTER — Other Ambulatory Visit: Payer: Self-pay

## 2020-05-04 ENCOUNTER — Encounter (HOSPITAL_COMMUNITY)
Admission: RE | Admit: 2020-05-04 | Discharge: 2020-05-04 | Disposition: A | Discharge status: Home / Self Care | Payer: 59 | Source: Ambulatory Visit | Attending: Internal Medicine | Admitting: Internal Medicine

## 2020-05-04 DIAGNOSIS — D649 Anemia, unspecified: Secondary | ICD-10-CM | POA: Diagnosis not present

## 2020-05-04 MED ORDER — FERUMOXYTOL INJECTION 510 MG/17 ML
510.0000 mg | INTRAVENOUS | Status: DC
  Administered 2020-05-04: 510 mg via INTRAVENOUS
  Filled 2020-05-04: qty 510

## 2020-05-11 ENCOUNTER — Other Ambulatory Visit: Payer: Self-pay

## 2020-05-11 ENCOUNTER — Encounter (HOSPITAL_COMMUNITY)
Admission: RE | Admit: 2020-05-11 | Discharge: 2020-05-11 | Disposition: A | Discharge status: Home / Self Care | Payer: 59 | Source: Ambulatory Visit | Attending: Internal Medicine | Admitting: Internal Medicine

## 2020-05-11 DIAGNOSIS — D649 Anemia, unspecified: Secondary | ICD-10-CM | POA: Diagnosis not present

## 2020-05-11 MED ORDER — SODIUM CHLORIDE 0.9 % IV SOLN
510.0000 mg | INTRAVENOUS | Status: AC
  Administered 2020-05-11: 510 mg via INTRAVENOUS
  Filled 2020-05-11: qty 510

## 2020-06-17 DIAGNOSIS — R339 Retention of urine, unspecified: Secondary | ICD-10-CM | POA: Diagnosis not present

## 2020-06-20 DIAGNOSIS — Z01419 Encounter for gynecological examination (general) (routine) without abnormal findings: Secondary | ICD-10-CM | POA: Diagnosis not present

## 2020-06-20 DIAGNOSIS — Z6839 Body mass index (BMI) 39.0-39.9, adult: Secondary | ICD-10-CM | POA: Diagnosis not present

## 2020-07-13 DIAGNOSIS — N951 Menopausal and female climacteric states: Secondary | ICD-10-CM | POA: Diagnosis not present

## 2020-07-22 DIAGNOSIS — E1169 Type 2 diabetes mellitus with other specified complication: Secondary | ICD-10-CM | POA: Diagnosis not present

## 2020-07-22 DIAGNOSIS — D509 Iron deficiency anemia, unspecified: Secondary | ICD-10-CM | POA: Diagnosis not present

## 2020-07-22 DIAGNOSIS — R000 Tachycardia, unspecified: Secondary | ICD-10-CM | POA: Diagnosis not present

## 2020-07-22 DIAGNOSIS — E781 Pure hyperglyceridemia: Secondary | ICD-10-CM | POA: Diagnosis not present

## 2020-08-08 DIAGNOSIS — F3131 Bipolar disorder, current episode depressed, mild: Secondary | ICD-10-CM | POA: Diagnosis not present

## 2020-08-08 DIAGNOSIS — F900 Attention-deficit hyperactivity disorder, predominantly inattentive type: Secondary | ICD-10-CM | POA: Diagnosis not present

## 2020-08-08 DIAGNOSIS — F3174 Bipolar disorder, in full remission, most recent episode manic: Secondary | ICD-10-CM | POA: Diagnosis not present

## 2020-08-12 DIAGNOSIS — R339 Retention of urine, unspecified: Secondary | ICD-10-CM | POA: Diagnosis not present

## 2020-08-16 ENCOUNTER — Other Ambulatory Visit: Payer: Self-pay | Admitting: Obstetrics & Gynecology

## 2020-08-16 DIAGNOSIS — Z1231 Encounter for screening mammogram for malignant neoplasm of breast: Secondary | ICD-10-CM

## 2020-08-18 ENCOUNTER — Ambulatory Visit (INDEPENDENT_AMBULATORY_CARE_PROVIDER_SITE_OTHER): Payer: BC Managed Care – PPO | Admitting: Gastroenterology

## 2020-08-18 ENCOUNTER — Other Ambulatory Visit: Payer: BC Managed Care – PPO

## 2020-08-18 ENCOUNTER — Encounter: Payer: Self-pay | Admitting: Gastroenterology

## 2020-08-18 VITALS — BP 136/72 | HR 68 | Ht 66.0 in | Wt 242.2 lb

## 2020-08-18 DIAGNOSIS — R197 Diarrhea, unspecified: Secondary | ICD-10-CM | POA: Diagnosis not present

## 2020-08-18 DIAGNOSIS — R1030 Lower abdominal pain, unspecified: Secondary | ICD-10-CM

## 2020-08-18 MED ORDER — CHOLESTYRAMINE 4 G PO PACK: 4.0000 g | PACK | Freq: Every day | ORAL | 12 refills | Status: DC

## 2020-08-18 NOTE — Patient Instructions (Signed)
Your provider has requested that you go to the basement level for lab work before leaving today. Press "B" on the elevator. The lab is located at the first door on the left as you exit the elevator.  We have sent the following medications to your pharmacy for you to pick up at your convenience: Questran 1 packet daily at lunch time.   If you are age 41 or older, your body mass index should be between 23-30. Your Body mass index is 39.09 kg/m. If this is out of the aforementioned range listed, please consider follow up with your Primary Care Provider.  If you are age 24 or younger, your body mass index should be between 19-25. Your Body mass index is 39.09 kg/m. If this is out of the aformentioned range listed, please consider follow up with your Primary Care Provider.   __________________________________________________________  The [facility] providers would like to encourage you to use [facility] to communicate with providers for non-urgent requests or questions.  Due to long hold times on the telephone, sending your provider a message by [facility] may be a faster and more efficient way to get a response.  Please allow 48 business hours for a response.  Please remember that this is for non-urgent requests.

## 2020-08-18 NOTE — Progress Notes (Signed)
Agree with assessment and plan as outlined.  

## 2020-08-18 NOTE — Progress Notes (Signed)
08/18/2020 [name] [id number] 1980/05/27   HISTORY OF PRESENT ILLNESS: This is a 40 year old female who is a patient of Dr. Doyne Keel.  She has a complex medical history to include bipolar disorder, chronic abdominal pain, pelvic pain, fibromyalgia, hx of IC s/p total cystectomy with [place] pouch. She is s/p hysterectomy. She was on chronic narcotics for several years for chronic pain, has had bilateral celiac plexus blocks in June 2016 and has a neurostimulator in place.   EGD 01/15/2017 - LA grade A esophagitis, inflammatory nodule at GEJ, normal esophagus otherwise, normal stomach, normal small bowel.   Colonoscopy January 2019 showed the following:  - Patent end-to-end ileo-colonic anastomosis, characterized by healthy appearing mucosa. - The examined portion of the ileum was normal. - One 3 mm polyp in the transverse colon, removed with a cold biopsy forceps. Resected and retrieved. - The examination was otherwise normal on direct and retroflexion views.  She is here today with complaints of diarrhea.  Referred here by Dr. Dagmar Hait.  She says that overall this started last August.  She says that she was "dropped" by her pain management doctor and so stopped her pain medicines cold [place].  She went through withdrawals.  The diarrhea has continued since then and she is going to the bathroom to be 5-10 times a day, usually closer to 10 times a day.  She says that most of the time the bowel movements tend to taper off towards the evening and then she does not usually wake up with bowel movements at night.  She reports lower abdominal pain and a bloated/distended feeling in her lower abdomen.  She took a probiotic in the form of Florastor, but says that that did not help.  She is on Macrodantin ongoingly for urinary tract infection prevention.   Past Medical History:  Diagnosis Date   ADHD (attention deficit hyperactivity disorder)    Allergy    Anemia    Anxiety     Bipolar disorder ([place])    Chronic back pain    Chronic headaches    Chronic interstitial cystitis    "removed my bladder when I went to end stage IC"   Depression    Fibromyalgia    GERD (gastroesophageal reflux disease)    Gestational diabetes    resolved   History of blood transfusion 02/2010   "related to OR"   History of kidney stones    HLD (hyperlipidemia)    Hypothyroid    Interstitial cystitis    Self-catheterizes urinary bladder    "I have an [place] pouch; made a bladder using part of my small intestines; cath out of stoma in my belly button" (08/02/2015)   Small bowel obstruction ([place]) 08/03/2015   Thyroid disease    Past Surgical History:  Procedure Laterality Date   ABDOMINAL HYSTERECTOMY  02/2010   "left my ovaries"   APPENDECTOMY  02/2010   BLADDER REMOVAL  02/2010   and [place]  2009   CHOLECYSTECTOMY N/A 05/27/2017   Procedure: LAPAROSCOPIC CHOLECYSTECTOMY;  Surgeon: Ralene Ok, MD;  Location: [facility];  Service: General;  Laterality: N/A;   COLONOSCOPY     [place] pouch     repair of tear   LAPAROSCOPIC LYSIS OF ADHESIONS N/A 05/27/2017   Procedure: LAPAROSCOPIC LYSIS OF ADHESIONS;  Surgeon: Ralene Ok, MD;  Location: [facility];  Service: General;  Laterality: N/A;   OTHER SURGICAL HISTORY     remval of urethra when bladder  was removed   SMALL INTESTINE SURGERY  2017   for bowel obstruction   SPINAL CORD STIMULATOR IMPLANT  09/2009; ~ 04/2015   SPINAL CORD STIMULATOR REMOVAL  ~ 04/2015   UPPER GI ENDOSCOPY     WISDOM TOOTH EXTRACTION      reports that she has never smoked. She has never used smokeless tobacco. She reports current alcohol use. She reports that she does not use drugs. family history includes Breast cancer in her maternal aunt; Diabetes in her paternal grandfather and paternal grandmother; Heart disease in her paternal grandfather and paternal grandmother; Liver cancer in her paternal grandfather; Lung cancer in her  mother; Pancreatic cancer in her paternal grandfather. Allergies  Allergen Reactions   Keflex [Cephalexin] Hives   Penicillins Hives and Other (See Comments)    PATIENT HAS HAD A PCN REACTION WITH IMMEDIATE RASH, FACIAL/TONGUE/THROAT SWELLING, SOB, OR LIGHTHEADEDNESS WITH HYPOTENSION:  #  #  #  YES  #  #  #   Has patient had a PCN reaction causing severe rash involving mucus membranes or skin necrosis: No Has patient had a PCN reaction that required hospitalization No Has patient had a PCN reaction occurring within the last 10 years: No If all of the above answers are "NO", then may proceed with Cephalosporin use.    Sulfa Antibiotics Hives      Outpatient Encounter Medications as of 08/18/2020  Medication Sig   amphetamine-dextroamphetamine (ADDERALL) 30 MG tablet Take 30 mg by mouth 2 (two) times daily.   cariprazine (VRAYLAR) 3 MG capsule Take 3 mg by mouth at bedtime.   clonazePAM (KLONOPIN) 1 MG tablet Take 1 mg by mouth 2 (two) times daily as needed.   estradiol (ESTRACE) 0.5 MG tablet TAKE ONE TABLET AS NEEDED ONCE A DAY FOR "MENOPAUSAL SYMPTOMS"   fenofibrate 160 MG tablet Take 160 mg by mouth daily.   levonorgestrel-ethinyl estradiol (SEASONALE,INTROVALE,JOLESSA) 0.15-0.03 MG tablet Take 1 tablet by mouth daily.   levothyroxine (SYNTHROID, LEVOTHROID) 137 MCG tablet Take 137 mcg by mouth daily before breakfast.    Meth-Hyo-M Bl-Na Phos-Ph Sal (URIBEL) 118 MG CAPS Take 1 capsule by mouth every 6 (six) hours.   metoprolol succinate (TOPROL-XL) 100 MG 24 hr tablet Take 100 mg by mouth daily.   nitrofurantoin (MACRODANTIN) 50 MG capsule Take 50 mg by mouth at bedtime.   ondansetron (ZOFRAN-ODT) 4 MG disintegrating tablet Take 1 tablet (4 mg total) by mouth every 8 (eight) hours as needed for nausea or vomiting.   rOPINIRole (REQUIP) 0.25 MG tablet TAKE 1 TABLET 1-2 HOURS PRIOR TO BEDTIME AS DIRECTED   rosuvastatin (CRESTOR) 5 MG tablet Take 1 tablet by mouth daily.   tiZANidine  (ZANAFLEX) 4 MG capsule Take 4 mg by mouth 2 (two) times daily.    traZODone (DESYREL) 50 MG tablet Take 100-150 mg by mouth See admin instructions. Take 150 mg by mouth at bedtime on Friday, Saturday and Sunday. Take 100 mg by mouth at bedtime on all other days   topiramate (TOPAMAX) 100 MG tablet Take 100 mg by mouth 2 (two) times daily.   [DISCONTINUED] acetaminophen (TYLENOL) 650 MG CR tablet Take 1,300 mg by mouth every 8 (eight) hours as needed for pain.   [DISCONTINUED] AMBULATORY NON FORMULARY MEDICATION Medication Name: FDgard 1 to 2 tablets before meals (Patient not taking: Reported on 05/10/2017)   [DISCONTINUED] busPIRone (BUSPAR) 15 MG tablet Take 15 mg by mouth 2 (two) times daily.    [DISCONTINUED] cetirizine (ZYRTEC) 10 MG tablet  Take 10 mg by mouth daily.   [DISCONTINUED] gabapentin (NEURONTIN) 600 MG tablet Take 3 tablets by mouth at bedtime.   [DISCONTINUED] hydrocortisone (ANUSOL-HC) 2.5 % rectal cream apply to rectal area sparingly twice daily for 7 days   [DISCONTINUED] ibuprofen (ADVIL,MOTRIN) 800 MG tablet Take 800 mg by mouth 3 (three) times daily.    [DISCONTINUED] lamoTRIgine (LAMICTAL) 200 MG tablet Take 200 mg by mouth at bedtime.    [DISCONTINUED] lithium carbonate (ESKALITH) 450 MG CR tablet Take 450 mg by mouth at bedtime.   [DISCONTINUED] Morphine-Naltrexone (EMBEDA) 30-1.2 MG CPCR Take 1 tablet by mouth 3 (three) times daily.   [DISCONTINUED] omeprazole (PRILOSEC) 40 MG capsule Take 1 capsule (40 mg total) by mouth 2 (two) times daily. (Patient not taking: Reported on 05/20/2017)   [DISCONTINUED] pantoprazole (PROTONIX) 40 MG tablet Take 40 mg by mouth daily.   [DISCONTINUED] triamterene-hydrochlorothiazide (MAXZIDE) 75-50 MG tablet Take 1 tablet by mouth daily.    [DISCONTINUED] Vitamin D, Ergocalciferol, (DRISDOL) 50000 units CAPS capsule Take 50,000 Units by mouth See admin instructions. Take 50000 units by mouth daily on Sunday, Tuesday and Thursday    Facility-Administered Encounter Medications as of 08/18/2020  Medication   0.9 %  sodium chloride infusion     REVIEW OF SYSTEMS  : All other systems reviewed and negative except where noted in the History of Present Illness.   PHYSICAL EXAM: BP 136/72   Pulse 68   Ht 5\' 6"  (1.676 m)   Wt 242 lb 3.2 oz (109.9 kg)   BMI 39.09 kg/m  General: Well developed white female in no acute distress Head: Normocephalic and atraumatic Eyes:  Sclerae anicteric, conjunctiva pink. Ears: Normal auditory acuity Lungs: Clear throughout to auscultation; no W/R/R. Heart: Regular rate and rhythm; no M/R/G. Abdomen: Soft, non-tender.  BS present.  Non-tender.  Urinary stoma noted at the umbilicus. Musculoskeletal: Symmetrical with no gross deformities  Skin: No lesions on visible extremities Extremities: No edema  Neurological: Alert oriented x 4, grossly non-focal Psychological:  Alert and cooperative. Normal mood and affect  ASSESSMENT AND PLAN: *Diarrhea and lower abdominal pain: Very well could be a combination of IBS and some bile salt related diarrhea from having her gallbladder removed.  She is on chronic antibiotics, Macrodantin, so I think we should rule out C. difficile.  We will check a fecal calprotectin and a pancreatic fecal elastase as well.  We will check celiac labs.  Would like her to try Questran beginning with 1 packet daily probably at lunchtime would be ideal.  Prescription sent to pharmacy.  Had an unremarkable colonoscopy in January 2019.  We will follow-up pending the above results.  ? SIBO so could consider breath test vs empiric treatment.   CC:  Prince Solian, MD

## 2020-08-19 ENCOUNTER — Ambulatory Visit: Payer: 59 | Admitting: Nurse Practitioner

## 2020-08-19 LAB — IGA: Immunoglobulin A: 146 mg/dL (ref 47–310)

## 2020-08-19 LAB — TISSUE TRANSGLUTAMINASE ABS,IGG,IGA
(tTG) Ab, IgA: <1 U/mL
(tTG) Ab, IgG: 2.7 U/mL

## 2020-08-24 LAB — CLOSTRIDIUM DIFFICILE TOXIN B, QUALITATIVE, REAL-TIME PCR: Toxigenic C. Difficile by PCR: NOT DETECTED

## 2020-08-24 LAB — PANCREATIC ELASTASE, FECAL: Pancreatic Elastase-1, Stool: >500 mcg/g

## 2020-08-24 LAB — CALPROTECTIN, FECAL: Calprotectin, Fecal: 43 ug/g (ref 0–120)

## 2020-08-25 MED ORDER — DICYCLOMINE HCL 10 MG PO CAPS: 10.0000 mg | ORAL_CAPSULE | Freq: Two times a day (BID) | ORAL | 2 refills | Status: DC

## 2020-09-16 DIAGNOSIS — R339 Retention of urine, unspecified: Secondary | ICD-10-CM | POA: Diagnosis not present

## 2020-09-26 ENCOUNTER — Telehealth: Payer: Self-pay | Admitting: Hematology and Oncology

## 2020-09-26 NOTE — Telephone Encounter (Signed)
Scheduled appt per referral. Pt aware.  

## 2020-09-29 ENCOUNTER — Encounter: Payer: Self-pay | Admitting: Hematology and Oncology

## 2020-09-29 ENCOUNTER — Inpatient Hospital Stay: Payer: BC Managed Care – PPO | Attending: Hematology and Oncology | Admitting: Hematology and Oncology

## 2020-09-29 ENCOUNTER — Other Ambulatory Visit: Payer: Self-pay

## 2020-09-29 ENCOUNTER — Inpatient Hospital Stay: Payer: BC Managed Care – PPO

## 2020-09-29 VITALS — BP 136/94 | HR 98 | Temp 98.4°F | Resp 18 | Ht 66.0 in | Wt 244.7 lb

## 2020-09-29 DIAGNOSIS — G2581 Restless legs syndrome: Secondary | ICD-10-CM | POA: Diagnosis not present

## 2020-09-29 DIAGNOSIS — D509 Iron deficiency anemia, unspecified: Secondary | ICD-10-CM

## 2020-09-29 DIAGNOSIS — Z801 Family history of malignant neoplasm of trachea, bronchus and lung: Secondary | ICD-10-CM

## 2020-09-29 DIAGNOSIS — Z800 Family history of malignant neoplasm of digestive organs: Secondary | ICD-10-CM | POA: Diagnosis not present

## 2020-09-29 DIAGNOSIS — Z808 Family history of malignant neoplasm of other organs or systems: Secondary | ICD-10-CM | POA: Diagnosis not present

## 2020-09-29 DIAGNOSIS — Z803 Family history of malignant neoplasm of breast: Secondary | ICD-10-CM | POA: Diagnosis not present

## 2020-09-29 LAB — COMPREHENSIVE METABOLIC PANEL
ALT: 28 U/L (ref 0–44)
AST: 26 U/L (ref 15–41)
Albumin: 4.5 g/dL (ref 3.5–5.0)
Alkaline Phosphatase: 56 U/L (ref 38–126)
Anion gap: 11 (ref 5–15)
BUN: 16 mg/dL (ref 6–20)
CO2: 19 mmol/L — ABNORMAL LOW (ref 22–32)
Calcium: 10 mg/dL (ref 8.9–10.3)
Chloride: 110 mmol/L (ref 98–111)
Creatinine, Ser: 0.87 mg/dL (ref 0.44–1.00)
GFR, Estimated: >60 mL/min (ref >60)
Glucose, Bld: 114 mg/dL — ABNORMAL HIGH (ref 70–99)
Potassium: 4.4 mmol/L (ref 3.5–5.1)
Sodium: 140 mmol/L (ref 135–145)
Total Bilirubin: 0.3 mg/dL (ref 0.3–1.2)
Total Protein: 8 g/dL (ref 6.5–8.1)

## 2020-09-29 LAB — CBC WITH DIFFERENTIAL/PLATELET
Abs Immature Granulocytes: 0.05 10*3/uL (ref 0.00–0.07)
Basophils Absolute: 0.1 10*3/uL (ref 0.0–0.1)
Basophils Relative: 1 %
Eosinophils Absolute: 0.1 10*3/uL (ref 0.0–0.5)
Eosinophils Relative: 1 %
HCT: 45.1 % (ref 36.0–46.0)
Hemoglobin: 15 g/dL (ref 12.0–15.0)
Immature Granulocytes: 1 %
Lymphocytes Relative: 25 %
Lymphs Abs: 2.2 10*3/uL (ref 0.7–4.0)
MCH: 27.8 pg (ref 26.0–34.0)
MCHC: 33.3 g/dL (ref 30.0–36.0)
MCV: 83.5 fL (ref 80.0–100.0)
Monocytes Absolute: 0.6 10*3/uL (ref 0.1–1.0)
Monocytes Relative: 7 %
Neutro Abs: 5.9 10*3/uL (ref 1.7–7.7)
Neutrophils Relative %: 65 %
Platelets: 367 10*3/uL (ref 150–400)
RBC: 5.4 MIL/uL — ABNORMAL HIGH (ref 3.87–5.11)
RDW: 14.6 % (ref 11.5–15.5)
WBC: 8.9 10*3/uL (ref 4.0–10.5)
nRBC: 0 % (ref 0.0–0.2)

## 2020-09-29 LAB — IRON AND TIBC
Iron: 91 ug/dL (ref 41–142)
Saturation Ratios: 16 % — ABNORMAL LOW (ref 21–57)
TIBC: 570 ug/dL — ABNORMAL HIGH (ref 236–444)
UIBC: 479 ug/dL — ABNORMAL HIGH (ref 120–384)

## 2020-09-29 LAB — FERRITIN: Ferritin: 34 ng/mL (ref 11–307)

## 2020-09-29 NOTE — Progress Notes (Signed)
[facility] NOTE  Patient Care Team: Prince Solian, MD as PCP - General (Internal Medicine)  CHIEF COMPLAINTS/PURPOSE OF CONSULTATION:  IDA, initial consult.  ASSESSMENT & PLAN:   This is a very pleasant 40 year old female patient with severe interstitial cystitis status post cystectomy, IBS, chronic pain dyslipidemia referred to hematology for evaluation of iron deficiency anemia.  She has received intravenous iron in the past in March 2022, tolerated Feraheme very well. Physical examination unremarkable, no palpable lymphadenopathy or hepatosplenomegaly, stoma noted in the lower abdomen which appears to be healthy.  At this time possibly etiology for iron deficiency anemia could be blood loss versus malabsorption.  It is a bit unusual to deplete iron stores in 3 months after intravenous iron infusion with malabsorption hence we will repeat labs today to confirm residual iron deficiency.  We will try to proceed with intravenous iron again if needed.  If she continues to need multiple intravenous iron formularies, then she may need repeat endoscopy and colonoscopy again. She is agreeable to these recommendations and expressed understanding of the plan.  She does have family history of breast cancer in her paternal aunt in her 25s, no clear criteria met for genetic testing. Age-appropriate cancer screening discussed.  We have discussed that estrogen supplementation can increase risk of breast cancer, she is aware of the risks and she will continue follow-up with her OB/GYN for further discussion.  HISTORY OF PRESENTING ILLNESS:   [name] 40 y.o. female is here because of IDA  This is a very pleasant 40 year old female patient with past medical history significant for severe interstitial cystitis, had cystectomy, chronic pain issues, irritable bowel syndrome referred to hematology for evaluation of iron deficiency anemia.  Patient had Feraheme in March 2022  when she was found to have iron deficiency and worsening restless leg syndrome. Since then she felt well but most recently has noticed that her restless legs have gotten worse, she denies any pica except that she tends to chew rubber bands.  She feels very fatigued but cannot tell if this is from her diarrhea.  She was on pain medication for a long time and was taken off of them cold [place] last year and since then has had some bowel problems with some diarrhea, grade 1 and has been followed up with GI.  She had endoscopy and colonoscopy in 2018 and 2019 respectively which did not show any clear evidence of iron deficiency anemia.  There was some discussion about capsule endoscopy but since there is some concern for obstruction, this was not pursued.  She denies any major change in her bowel habits such as hematochezia or melena except for diarrhea which has been ongoing for the past 1 year.  She tolerated iron very well. Rest of the pertinent 10 point ROS reviewed and negative.  REVIEW OF SYSTEMS:   Constitutional: Denies fevers, chills or abnormal night sweats Eyes: Denies blurriness of vision, double vision or watery eyes Ears, nose, mouth, throat, and face: Denies mucositis or sore throat Respiratory: Denies cough, dyspnea or wheezes Cardiovascular: Denies palpitation, chest discomfort or lower extremity swelling Gastrointestinal:  Denies nausea, heartburn or change in bowel habits Skin: Denies abnormal skin rashes Lymphatics: Denies new lymphadenopathy or easy bruising Neurological:Denies numbness, tingling or new weaknesses Behavioral/Psych: Mood is stable, no new changes  All other systems were reviewed with the patient and are negative.  MEDICAL HISTORY:  Past Medical History:  Diagnosis Date   ADHD (attention deficit hyperactivity disorder)  Allergy    Anemia    Anxiety    Bipolar disorder (HCC)    Chronic back pain    Chronic headaches    Chronic interstitial cystitis     "removed my bladder when I went to end stage IC"   Depression    Fibromyalgia    GERD (gastroesophageal reflux disease)    Gestational diabetes    resolved   History of blood transfusion 02/2010   "related to OR"   History of kidney stones    HLD (hyperlipidemia)    Hypothyroid    Interstitial cystitis    Self-catheterizes urinary bladder    "I have an [place] pouch; made a bladder using part of my small intestines; cath out of stoma in my belly button" (08/02/2015)   Small bowel obstruction ([place]) 08/03/2015   Thyroid disease     SURGICAL HISTORY: Past Surgical History:  Procedure Laterality Date   ABDOMINAL HYSTERECTOMY  02/2010   "left my ovaries"   APPENDECTOMY  02/2010   BLADDER REMOVAL  02/2010   and [place]  2009   CHOLECYSTECTOMY N/A 05/27/2017   Procedure: LAPAROSCOPIC CHOLECYSTECTOMY;  Surgeon: Ralene Ok, MD;  Location: [facility] OR;  Service: General;  Laterality: N/A;   COLONOSCOPY     indiana pouch     repair of tear   LAPAROSCOPIC LYSIS OF ADHESIONS N/A 05/27/2017   Procedure: LAPAROSCOPIC LYSIS OF ADHESIONS;  Surgeon: Ralene Ok, MD;  Location: [facility];  Service: General;  Laterality: N/A;   OTHER SURGICAL HISTORY     remval of urethra when bladder was removed   SMALL INTESTINE SURGERY  2017   for bowel obstruction   SPINAL CORD STIMULATOR IMPLANT  09/2009; ~ 04/2015   SPINAL CORD STIMULATOR REMOVAL  ~ 04/2015   UPPER GI ENDOSCOPY     WISDOM TOOTH EXTRACTION      SOCIAL HISTORY: Social History   Socioeconomic History   Marital status: Married    Spouse name: Not on file   Number of children: Not on file   Years of education: Not on file   Highest education level: Not on file  Occupational History   Not on file  Tobacco Use   Smoking status: Never   Smokeless tobacco: Never  Vaping Use   Vaping Use: Never used  Substance and Sexual Activity   Alcohol use: Yes    Comment: 08/03/2015 "glass of wine maybe twice/month"    Drug use: No   Sexual activity: Yes    Comment: hysterectomy  Other Topics Concern   Not on file  Social History Narrative   Not on file   Social Determinants of Health   Financial Resource Strain: Not on file  Food Insecurity: Not on file  Transportation Needs: Not on file  Physical Activity: Not on file  Stress: Not on file  Social Connections: Not on file  Intimate Partner Violence: Not on file    FAMILY HISTORY: Family History  Problem Relation Age of Onset   Lung cancer Mother    Diabetes Paternal Grandmother    Heart disease Paternal Grandmother    Diabetes Paternal Grandfather    Heart disease Paternal Grandfather    Pancreatic cancer Paternal Grandfather    Liver cancer Paternal Grandfather    Breast cancer Maternal Aunt    Colon cancer Neg Hx    Esophageal cancer Neg Hx    Prostate cancer Neg Hx    Rectal cancer Neg Hx  Stomach cancer Neg Hx     ALLERGIES:  is allergic to keflex [cephalexin], penicillins, and sulfa antibiotics.  MEDICATIONS:  Current Outpatient Medications  Medication Sig Dispense Refill   amphetamine-dextroamphetamine (ADDERALL) 30 MG tablet Take 30 mg by mouth 2 (two) times daily.     cariprazine (VRAYLAR) 3 MG capsule Take 3 mg by mouth at bedtime.     cholestyramine (QUESTRAN) 4 g packet Take 1 packet (4 g total) by mouth daily. 60 each 12   clonazePAM (KLONOPIN) 1 MG tablet Take 1 mg by mouth 2 (two) times daily as needed.     dicyclomine (BENTYL) 10 MG capsule Take 1 capsule (10 mg total) by mouth 2 (two) times daily. 60 capsule 2   estradiol (ESTRACE) 0.5 MG tablet TAKE ONE TABLET AS NEEDED ONCE A DAY FOR "MENOPAUSAL SYMPTOMS"     fenofibrate 160 MG tablet Take 160 mg by mouth daily.     levonorgestrel-ethinyl estradiol (SEASONALE,INTROVALE,JOLESSA) 0.15-0.03 MG tablet Take 1 tablet by mouth daily.     levothyroxine (SYNTHROID, LEVOTHROID) 137 MCG tablet Take 137 mcg by mouth daily before breakfast.      Meth-Hyo-M Bl-Na Phos-Ph  Sal (URIBEL) 118 MG CAPS Take 1 capsule by mouth every 6 (six) hours.     metoprolol succinate (TOPROL-XL) 100 MG 24 hr tablet Take 100 mg by mouth daily.     nitrofurantoin (MACRODANTIN) 50 MG capsule Take 50 mg by mouth at bedtime.     ondansetron (ZOFRAN-ODT) 4 MG disintegrating tablet Take 1 tablet (4 mg total) by mouth every 8 (eight) hours as needed for nausea or vomiting. 24 tablet 3   rOPINIRole (REQUIP) 0.25 MG tablet TAKE 1 TABLET 1-2 HOURS PRIOR TO BEDTIME AS DIRECTED     rosuvastatin (CRESTOR) 5 MG tablet Take 1 tablet by mouth daily.     tiZANidine (ZANAFLEX) 4 MG capsule Take 4 mg by mouth 2 (two) times daily.      topiramate (TOPAMAX) 100 MG tablet Take 100 mg by mouth 2 (two) times daily.     traZODone (DESYREL) 50 MG tablet Take 100-150 mg by mouth See admin instructions. Take 150 mg by mouth at bedtime on Friday, Saturday and Sunday. Take 100 mg by mouth at bedtime on all other days     Current Facility-Administered Medications  Medication Dose Route Frequency Provider Last Rate Last Admin   0.9 %  sodium chloride infusion  500 mL Intravenous Once Armbruster, Carlota Raspberry, MD         PHYSICAL EXAMINATION:  ECOG PERFORMANCE STATUS: 0 - Asymptomatic  Vitals:   09/29/20 1115  BP: (!) 136/94  Pulse: 98  Resp: 18  Temp: 98.4 F (36.9 C)  SpO2: 98%   Filed Weights   09/29/20 1115  Weight: 244 lb 11.2 oz (111 kg)    GENERAL:alert, no distress and comfortable SKIN:, .  skin color, texture, turgor are normal, no rashes or significant lesions EYES: normal, conjunctiva are pink and non-injected, sclera clear OROPHARYNX:no exudate, no erythema and lips, buccal mucosa, and tongue normal  NECK: supple, thyroid normal size, non-tender, without nodularity LYMPH:  no palpable lymphadenopathy in the cervical, axillary or inguinal LUNGS: clear to auscultation and percussion with normal breathing effort HEART: regular rate & rhythm and no murmurs and no lower extremity  edema ABDOMEN:abdomen soft, non-tender and normal bowel sounds.  Some vague tenderness diffusely in the abdomen.  Stoma noted in the lower abdomen, appears healthy Musculoskeletal:no cyanosis of digits and no clubbing  PSYCH: alert &  oriented x 3 with fluent speech NEURO: no focal motor/sensory deficits  LABORATORY DATA:  I have reviewed the data as listed Lab Results  Component Value Date   WBC 9.7 05/23/2017   HGB 12.4 05/23/2017   HCT 39.1 05/23/2017   MCV 81.5 05/23/2017   PLT 395 05/23/2017     Chemistry      Component Value Date/Time   NA 136 05/23/2017 1000   K 3.9 05/23/2017 1000   CL 108 05/23/2017 1000   CO2 19 (L) 05/23/2017 1000   BUN 17 05/23/2017 1000   CREATININE 1.30 (H) 05/23/2017 1000      Component Value Date/Time   CALCIUM 9.4 05/23/2017 1000   ALKPHOS 38 05/12/2016 1427   AST 17 05/12/2016 1427   ALT 16 05/12/2016 1427   BILITOT 0.7 05/12/2016 1427     Reviewed her available labs which showed mild microcytic hypochromic anemia.  We do not have very recent iron labs hence we will repeat these today.  RADIOGRAPHIC STUDIES: I have personally reviewed the radiological images as listed and agreed with the findings in the report. No results found.  All questions were answered. The patient knows to call the clinic with any problems, questions or concerns. I spent 45 minutes in the care of this patient including H and P, review of records, counseling and coordination of care. We have reviewed common cause of iron deficiency anemia, treatment for iron deficiency anemia, possible need to repeat endoscopy and colonoscopy if she does not respond to intravenous iron as planned today.    Benay Pike, MD 09/29/2020 11:28 AM

## 2020-10-06 ENCOUNTER — Encounter: Payer: Self-pay | Admitting: Physician Assistant

## 2020-10-10 ENCOUNTER — Ambulatory Visit
Admission: RE | Admit: 2020-10-10 | Discharge: 2020-10-10 | Disposition: A | Discharge status: Home / Self Care | Payer: BC Managed Care – PPO | Source: Ambulatory Visit | Attending: Obstetrics & Gynecology | Admitting: Obstetrics & Gynecology

## 2020-10-10 ENCOUNTER — Other Ambulatory Visit: Payer: Self-pay

## 2020-10-10 DIAGNOSIS — Z1231 Encounter for screening mammogram for malignant neoplasm of breast: Secondary | ICD-10-CM | POA: Diagnosis not present

## 2020-10-13 ENCOUNTER — Encounter: Payer: Self-pay | Admitting: Gastroenterology

## 2020-10-21 ENCOUNTER — Telehealth: Payer: Self-pay | Admitting: Gastroenterology

## 2020-10-21 NOTE — Telephone Encounter (Signed)
Left message on machine to call back  

## 2020-10-24 NOTE — Telephone Encounter (Signed)
Left message on machine to call back  

## 2020-10-26 NOTE — Telephone Encounter (Signed)
Left message on machine to call back  

## 2020-10-27 NOTE — Telephone Encounter (Signed)
Sent via My Chart and read by pt via My Chart 10/26/20:  Patient Message Open  10/26/2020 [facility] , , LPN   Conversation: SIBO test results (Newest Message First) October 26, 2020 Me to [name], [name]     1:58 PM Hi [name],   We recently received results from [facility] for your SIBO test. It appears the results were "INDETERMINATE" due to being "TOO HIGH". This means that when your specimen was analyzed, it was found to contain CO2 levels NOT consistent with your lung air. This can happen for the following reasons:   Taking a deep breath PRIOR to exhaling into the breathing device essentially places room air into the test tube Puffing into the breathing device Inserting the tube into the breathing device, then raising it up to the mouth to exhale. Proper technique would be to exhale, then puncture the tube during their exhalation, count 1-2 seconds, remove the test tube from the needle, then stop exhaling. Puncturing a single test tube more than once. This can often happen when you feel you have collected incorrectly.   We have informed [facility] that we would like for you to be retested. You will receive a call from Customer Service to review proper collection. In addition, you will need to stop by the office to pick up a new test kit. This test kit has been placed at the front desk for you to pick up at your convenience.   Thank you,  Gastroenterology Team  Last read by [name] at 1:59 PM on 10/26/2020. 

## 2020-10-27 NOTE — Telephone Encounter (Signed)
[name] called to say that the testing was indeterminate.  They want to know if the pt needs to be retested.  Please advise

## 2020-10-31 DIAGNOSIS — R339 Retention of urine, unspecified: Secondary | ICD-10-CM | POA: Diagnosis not present

## 2020-11-05 ENCOUNTER — Other Ambulatory Visit: Payer: Self-pay | Admitting: Gastroenterology

## 2020-11-23 DIAGNOSIS — E1169 Type 2 diabetes mellitus with other specified complication: Secondary | ICD-10-CM | POA: Diagnosis not present

## 2020-11-23 DIAGNOSIS — R000 Tachycardia, unspecified: Secondary | ICD-10-CM | POA: Diagnosis not present

## 2020-11-23 DIAGNOSIS — Z23 Encounter for immunization: Secondary | ICD-10-CM | POA: Diagnosis not present

## 2020-11-23 DIAGNOSIS — D509 Iron deficiency anemia, unspecified: Secondary | ICD-10-CM | POA: Diagnosis not present

## 2020-11-29 ENCOUNTER — Other Ambulatory Visit: Payer: Self-pay

## 2020-11-29 DIAGNOSIS — R339 Retention of urine, unspecified: Secondary | ICD-10-CM | POA: Diagnosis not present

## 2020-11-29 DIAGNOSIS — R197 Diarrhea, unspecified: Secondary | ICD-10-CM

## 2020-11-29 DIAGNOSIS — R1030 Lower abdominal pain, unspecified: Secondary | ICD-10-CM

## 2020-11-29 MED ORDER — RIFAXIMIN 550 MG PO TABS: 550.0000 mg | ORAL_TABLET | Freq: Three times a day (TID) | ORAL | 0 refills | Status: DC

## 2020-12-02 ENCOUNTER — Telehealth: Payer: Self-pay | Admitting: Gastroenterology

## 2020-12-05 NOTE — Telephone Encounter (Signed)
Please advise 

## 2020-12-08 MED ORDER — METRONIDAZOLE 250 MG PO TABS: 250.0000 mg | ORAL_TABLET | Freq: Three times a day (TID) | ORAL | 0 refills | Status: AC

## 2020-12-08 NOTE — Telephone Encounter (Signed)
Patient informed of change from Xifaxan to Flagyl. Patient voiced understanding. Script sent to pharmacy.

## 2020-12-12 DIAGNOSIS — G8929 Other chronic pain: Secondary | ICD-10-CM | POA: Diagnosis not present

## 2020-12-12 DIAGNOSIS — N390 Urinary tract infection, site not specified: Secondary | ICD-10-CM | POA: Diagnosis not present

## 2020-12-12 DIAGNOSIS — N393 Stress incontinence (female) (male): Secondary | ICD-10-CM | POA: Diagnosis not present

## 2020-12-12 DIAGNOSIS — Z9889 Other specified postprocedural states: Secondary | ICD-10-CM | POA: Diagnosis not present

## 2020-12-12 DIAGNOSIS — R3915 Urgency of urination: Secondary | ICD-10-CM | POA: Diagnosis not present

## 2020-12-12 DIAGNOSIS — Z906 Acquired absence of other parts of urinary tract: Secondary | ICD-10-CM | POA: Diagnosis not present

## 2020-12-12 DIAGNOSIS — R102 Pelvic and perineal pain: Secondary | ICD-10-CM | POA: Diagnosis not present

## 2020-12-14 DIAGNOSIS — D1801 Hemangioma of skin and subcutaneous tissue: Secondary | ICD-10-CM | POA: Diagnosis not present

## 2020-12-14 DIAGNOSIS — D1809 Hemangioma of other sites: Secondary | ICD-10-CM | POA: Diagnosis not present

## 2020-12-14 DIAGNOSIS — D2372 Other benign neoplasm of skin of left lower limb, including hip: Secondary | ICD-10-CM | POA: Diagnosis not present

## 2020-12-14 DIAGNOSIS — B070 Plantar wart: Secondary | ICD-10-CM | POA: Diagnosis not present

## 2020-12-14 DIAGNOSIS — L821 Other seborrheic keratosis: Secondary | ICD-10-CM | POA: Diagnosis not present

## 2020-12-19 DIAGNOSIS — Z906 Acquired absence of other parts of urinary tract: Secondary | ICD-10-CM | POA: Diagnosis not present

## 2020-12-19 DIAGNOSIS — Z9889 Other specified postprocedural states: Secondary | ICD-10-CM | POA: Diagnosis not present

## 2020-12-19 DIAGNOSIS — N393 Stress incontinence (female) (male): Secondary | ICD-10-CM | POA: Diagnosis not present

## 2020-12-29 ENCOUNTER — Other Ambulatory Visit: Payer: Self-pay | Admitting: Physician Assistant

## 2020-12-29 DIAGNOSIS — R339 Retention of urine, unspecified: Secondary | ICD-10-CM | POA: Diagnosis not present

## 2020-12-29 DIAGNOSIS — D509 Iron deficiency anemia, unspecified: Secondary | ICD-10-CM

## 2020-12-30 ENCOUNTER — Inpatient Hospital Stay (HOSPITAL_BASED_OUTPATIENT_CLINIC_OR_DEPARTMENT_OTHER): Payer: BC Managed Care – PPO | Admitting: Physician Assistant

## 2020-12-30 ENCOUNTER — Inpatient Hospital Stay: Payer: BC Managed Care – PPO | Attending: Physician Assistant

## 2020-12-30 ENCOUNTER — Other Ambulatory Visit: Payer: Self-pay

## 2020-12-30 DIAGNOSIS — Z803 Family history of malignant neoplasm of breast: Secondary | ICD-10-CM | POA: Diagnosis not present

## 2020-12-30 DIAGNOSIS — E611 Iron deficiency: Secondary | ICD-10-CM

## 2020-12-30 DIAGNOSIS — Z801 Family history of malignant neoplasm of trachea, bronchus and lung: Secondary | ICD-10-CM | POA: Diagnosis not present

## 2020-12-30 DIAGNOSIS — Z808 Family history of malignant neoplasm of other organs or systems: Secondary | ICD-10-CM | POA: Diagnosis not present

## 2020-12-30 DIAGNOSIS — D509 Iron deficiency anemia, unspecified: Secondary | ICD-10-CM | POA: Diagnosis not present

## 2020-12-30 DIAGNOSIS — Z800 Family history of malignant neoplasm of digestive organs: Secondary | ICD-10-CM | POA: Diagnosis not present

## 2020-12-30 LAB — CBC WITH DIFFERENTIAL (CANCER CENTER ONLY)
Abs Immature Granulocytes: 0.06 10*3/uL (ref 0.00–0.07)
Basophils Absolute: 0.1 10*3/uL (ref 0.0–0.1)
Basophils Relative: 1 %
Eosinophils Absolute: 0.1 10*3/uL (ref 0.0–0.5)
Eosinophils Relative: 1 %
HCT: 44.5 % (ref 36.0–46.0)
Hemoglobin: 14.6 g/dL (ref 12.0–15.0)
Immature Granulocytes: 1 %
Lymphocytes Relative: 27 %
Lymphs Abs: 2.5 10*3/uL (ref 0.7–4.0)
MCH: 28 pg (ref 26.0–34.0)
MCHC: 32.8 g/dL (ref 30.0–36.0)
MCV: 85.4 fL (ref 80.0–100.0)
Monocytes Absolute: 0.7 10*3/uL (ref 0.1–1.0)
Monocytes Relative: 8 %
Neutro Abs: 5.9 10*3/uL (ref 1.7–7.7)
Neutrophils Relative %: 62 %
Platelet Count: 393 10*3/uL (ref 150–400)
RBC: 5.21 MIL/uL — ABNORMAL HIGH (ref 3.87–5.11)
RDW: 13.8 % (ref 11.5–15.5)
WBC Count: 9.3 10*3/uL (ref 4.0–10.5)
nRBC: 0 % (ref 0.0–0.2)

## 2020-12-30 LAB — COMPREHENSIVE METABOLIC PANEL
ALT: 29 U/L (ref 0–44)
AST: 28 U/L (ref 15–41)
Albumin: 4.5 g/dL (ref 3.5–5.0)
Alkaline Phosphatase: 59 U/L (ref 38–126)
Anion gap: 11 (ref 5–15)
BUN: 12 mg/dL (ref 6–20)
CO2: 23 mmol/L (ref 22–32)
Calcium: 10.3 mg/dL (ref 8.9–10.3)
Chloride: 105 mmol/L (ref 98–111)
Creatinine, Ser: 0.87 mg/dL (ref 0.44–1.00)
GFR, Estimated: >60 mL/min (ref >60)
Glucose, Bld: 140 mg/dL — ABNORMAL HIGH (ref 70–99)
Potassium: 4.3 mmol/L (ref 3.5–5.1)
Sodium: 139 mmol/L (ref 135–145)
Total Bilirubin: 0.4 mg/dL (ref 0.3–1.2)
Total Protein: 8 g/dL (ref 6.5–8.1)

## 2020-12-30 LAB — IRON AND TIBC
Iron: 90 ug/dL (ref 41–142)
Saturation Ratios: 17 % — ABNORMAL LOW (ref 21–57)
TIBC: 528 ug/dL — ABNORMAL HIGH (ref 236–444)
UIBC: 438 ug/dL — ABNORMAL HIGH (ref 120–384)

## 2020-12-30 LAB — RETIC PANEL
Immature Retic Fract: 22 % — ABNORMAL HIGH (ref 2.3–15.9)
RBC.: 5.25 MIL/uL — ABNORMAL HIGH (ref 3.87–5.11)
Retic Count, Absolute: 101.9 10*3/uL (ref 19.0–186.0)
Retic Ct Pct: 1.9 % (ref 0.4–3.1)
Reticulocyte Hemoglobin: 32.2 pg (ref >27.9)

## 2020-12-30 LAB — FERRITIN: Ferritin: 26 ng/mL (ref 11–307)

## 2020-12-30 NOTE — Progress Notes (Signed)
[facility]  PROGRESS NOTE  Patient Care Team: Prince Solian, MD as PCP - General (Internal Medicine)  CHIEF COMPLAINTS/PURPOSE OF CONSULTATION:  Iron deficiency anemia  HISTORY OF PRESENTING ILLNESS:   [name] 40 y.o. female is here because of IDA.  She is unaccompanied for this visit.  She reports her energy levels are fairly stable.  She does have fatigue but can complete her daily activities independently.  Denies any weight changes and she denies any dietary restrictions at this time.  Patient continues to have chronic episodes of diarrhea, on average 7-8 episodes per day.  This has been present for over a year.  Patient currently takes Bentyl and Questran as directed by her gastroenterology team.  Distally, she reports intermittent episodes of nausea and vomiting that has been present for several years.  She adds that her nausea is more commonly present in the morning.  She denies easy bruising or signs of bleeding except for intermittent episodes of nosebleeds that she contributes to allergies and the cold weather. She denies any pica. She is unable to tolerate oral iron supplementation as it caused worsening GI intolerance. She denies any fevers, chills, night sweats, shortness of breath, chest pain or cough. She has no other complaints.   Rest of the pertinent 10 point ROS reviewed and negative.  REVIEW OF SYSTEMS:   Constitutional: Denies fevers, chills or abnormal night sweats Eyes: Denies blurriness of vision, double vision or watery eyes Ears, nose, mouth, throat, and face: Denies mucositis or sore throat Respiratory: Denies cough, dyspnea or wheezes Cardiovascular: Denies palpitation, chest discomfort or lower extremity swelling Gastrointestinal:  +Chronic nausea/vomiting intermittently. +Diarrhea (chronic and up to 10 episodes per day) Skin: Denies abnormal skin rashes Lymphatics: Denies new lymphadenopathy or easy bruising Neurological:Denies  numbness, tingling or new weaknesses Behavioral/Psych: Mood is stable, no new changes   All other systems were reviewed with the patient and are negative.  MEDICAL HISTORY:  Past Medical History:  Diagnosis Date   ADHD (attention deficit hyperactivity disorder)    Allergy    Anemia    Anxiety    Bipolar disorder ([place])    Chronic back pain    Chronic headaches    Chronic interstitial cystitis    "removed my bladder when I went to end stage IC"   Depression    Fibromyalgia    GERD (gastroesophageal reflux disease)    Gestational diabetes    resolved   History of blood transfusion 02/2010   "related to OR"   History of kidney stones    HLD (hyperlipidemia)    Hypothyroid    Interstitial cystitis    Self-catheterizes urinary bladder    "I have an [place] pouch; made a bladder using part of my small intestines; cath out of stoma in my belly button" (08/02/2015)   Small bowel obstruction ([place]) 08/03/2015   Thyroid disease     SURGICAL HISTORY: Past Surgical History:  Procedure Laterality Date   ABDOMINAL HYSTERECTOMY  02/2010   "left my ovaries"   APPENDECTOMY  02/2010   BLADDER REMOVAL  02/2010   and [place]  2009   CHOLECYSTECTOMY N/A 05/27/2017   Procedure: LAPAROSCOPIC CHOLECYSTECTOMY;  Surgeon: Ralene Ok, MD;  Location: [facility];  Service: General;  Laterality: N/A;   COLONOSCOPY     indiana pouch     repair of tear   LAPAROSCOPIC LYSIS OF ADHESIONS N/A 05/27/2017   Procedure: LAPAROSCOPIC LYSIS OF ADHESIONS;  Surgeon: Rosendo Gros,  Anne Hahn, MD;  Location: [facility];  Service: General;  Laterality: N/A;   OTHER SURGICAL HISTORY     remval of urethra when bladder was removed   SMALL INTESTINE SURGERY  2017   for bowel obstruction   SPINAL CORD STIMULATOR IMPLANT  09/2009; ~ 04/2015   SPINAL CORD STIMULATOR REMOVAL  ~ 04/2015   UPPER GI ENDOSCOPY     WISDOM TOOTH EXTRACTION      SOCIAL HISTORY: Social History   Socioeconomic History   Marital  status: Married    Spouse name: Not on file   Number of children: Not on file   Years of education: Not on file   Highest education level: Not on file  Occupational History   Not on file  Tobacco Use   Smoking status: Never   Smokeless tobacco: Never  Vaping Use   Vaping Use: Never used  Substance and Sexual Activity   Alcohol use: Yes    Comment: 08/03/2015 "glass of wine maybe twice/month"   Drug use: No   Sexual activity: Yes    Comment: hysterectomy  Other Topics Concern   Not on file  Social History Narrative   Not on file   Social Determinants of Health   Financial Resource Strain: Not on file  Food Insecurity: Not on file  Transportation Needs: Not on file  Physical Activity: Not on file  Stress: Not on file  Social Connections: Not on file  Intimate Partner Violence: Not on file    FAMILY HISTORY: Family History  Problem Relation Age of Onset   Lung cancer Mother    Diabetes Paternal Grandmother    Heart disease Paternal Grandmother    Diabetes Paternal Grandfather    Heart disease Paternal Grandfather    Pancreatic cancer Paternal Grandfather    Liver cancer Paternal Grandfather    Breast cancer Maternal Aunt    Colon cancer Neg Hx    Esophageal cancer Neg Hx    Prostate cancer Neg Hx    Rectal cancer Neg Hx    Stomach cancer Neg Hx     ALLERGIES:  is allergic to keflex [cephalexin], penicillins, and sulfa antibiotics.  MEDICATIONS:  Current Outpatient Medications  Medication Sig Dispense Refill   amphetamine-dextroamphetamine (ADDERALL) 30 MG tablet Take 30 mg by mouth 2 (two) times daily.     cariprazine (VRAYLAR) 3 MG capsule Take 3 mg by mouth at bedtime.     cholestyramine (QUESTRAN) 4 g packet Take 1 packet (4 g total) by mouth daily. 60 each 12   clonazePAM (KLONOPIN) 1 MG tablet Take 1 mg by mouth 2 (two) times daily as needed.     dicyclomine (BENTYL) 10 MG capsule TAKE 1 CAPSULE BY MOUTH 2 TIMES DAILY. 180 capsule 0   estradiol (ESTRACE)  0.5 MG tablet TAKE ONE TABLET AS NEEDED ONCE A DAY FOR "MENOPAUSAL SYMPTOMS"     fenofibrate 160 MG tablet Take 160 mg by mouth daily.     levonorgestrel-ethinyl estradiol (SEASONALE,INTROVALE,JOLESSA) 0.15-0.03 MG tablet Take 1 tablet by mouth daily.     levothyroxine (SYNTHROID, LEVOTHROID) 137 MCG tablet Take 137 mcg by mouth daily before breakfast.      Meth-Hyo-M Bl-Na Phos-Ph Sal (URIBEL) 118 MG CAPS Take 1 capsule by mouth every 6 (six) hours.     metoprolol succinate (TOPROL-XL) 100 MG 24 hr tablet Take 100 mg by mouth daily.     ondansetron (ZOFRAN-ODT) 4 MG disintegrating tablet Take 1 tablet (4 mg total) by mouth every 8 (eight)  hours as needed for nausea or vomiting. 24 tablet 3   rOPINIRole (REQUIP) 0.25 MG tablet Take 0.5 mg by mouth.     rosuvastatin (CRESTOR) 5 MG tablet Take 1 tablet by mouth daily.     tiZANidine (ZANAFLEX) 4 MG capsule Take 4 mg by mouth 2 (two) times daily.      traZODone (DESYREL) 50 MG tablet Take 100-150 mg by mouth See admin instructions. Take 150 mg by mouth at bedtime on Friday, Saturday and Sunday. Take 100 mg by mouth at bedtime on all other days     No current facility-administered medications for this visit.     PHYSICAL EXAMINATION:  ECOG PERFORMANCE STATUS: 0 - Asymptomatic  Vitals:   12/30/20 1249  BP: (!) 141/89  Pulse: 73  Resp: 18  Temp: 98.6 F (37 C)  SpO2: 100%   Filed Weights   12/30/20 1249  Weight: 245 lb 7 oz (111.3 kg)    GENERAL:alert, no distress and comfortable SKIN:, .  skin color, texture, turgor are normal, no rashes or significant lesions EYES: normal, conjunctiva are pink and non-injected, sclera clear OROPHARYNX:no exudate, no erythema and lips, buccal mucosa, and tongue normal  LUNGS: clear to auscultation and percussion with normal breathing effort HEART: regular rate & rhythm and no murmurs and no lower extremity edema ABDOMEN:abdomen soft, non-tender and normal bowel sounds.  Some tenderness diffusely in  the abdomen.  Stoma noted in the lower abdomen, appears healthy Musculoskeletal:no cyanosis of digits and no clubbing  PSYCH: alert & oriented x 3 with fluent speech NEURO: no focal motor/sensory deficits  LABORATORY DATA:  I have reviewed the data as listed Lab Results  Component Value Date   WBC 9.3 12/30/2020   HGB 14.6 12/30/2020   HCT 44.5 12/30/2020   MCV 85.4 12/30/2020   PLT 393 12/30/2020     Chemistry      Component Value Date/Time   NA 139 12/30/2020 1231   K 4.3 12/30/2020 1231   CL 105 12/30/2020 1231   CO2 23 12/30/2020 1231   BUN 12 12/30/2020 1231   CREATININE 0.87 12/30/2020 1231      Component Value Date/Time   CALCIUM 10.3 12/30/2020 1231   ALKPHOS 59 12/30/2020 1231   AST 28 12/30/2020 1231   ALT 29 12/30/2020 1231   BILITOT 0.4 12/30/2020 1231      RADIOGRAPHIC STUDIES: I have personally reviewed the radiological images as listed and agreed with the findings in the report. No results found.   ASSESSMENT & PLAN:  [name] is a 40 y.o. female who returns for follow-up for iron deficiency anemia.  She cannot tolerate oral iron supplementation due to GI intolerance.  She received IV Feraheme in the past in March 2022.  Etiology unknown but differentials include blood loss versus malabsorption.  She denies heavy menstrual bleeding.  Most recent colonoscopy was from 03/29/2017 and EGD from 01/15/2017, both showed no evidence of recent or active bleeding.  Labs today show no evidence of anemia with a hemoglobin of 14.6.  Persistent iron deficiency with iron saturation at 17%, TIBC 528, and ferritin 26.  Due to intolerance with oral iron supplementation, recommend proceeding with IV iron infusions.  She is scheduled for a follow-up with the gastroenterology team on 01/12/2021 and will discuss if repeat endoscopic evaluation is needed.  #Iron deficiency anemia, etiology unknown: --Unable to tolerate oral iron due to GI intolerance --Labs today show  persistent iron deficiency without anemia. Iron saturation at 17%,  TIBC 528, and ferritin 26.   --Recommend IV iron infusions. Will check which regimen is preferred by insurance.  --Under the care of Dr. Havery Moros in GI, next appt on 01/12/2021.  --RTC in 2 months with repeat labs.    All questions were answered. The patient knows to call the clinic with any problems, questions or concerns.  I have spent a total of 25 minutes minutes of face-to-face and non-face-to-face time, preparing to see the patient,  performing a medically appropriate examination, counseling and educating the patient, ordering medications,  documenting clinical information in the electronic health record, and care coordination.    Lincoln Brigham, PA-C Hematology and [place] at [facility]

## 2021-01-02 ENCOUNTER — Telehealth: Payer: Self-pay | Admitting: Pharmacy Technician

## 2021-01-02 ENCOUNTER — Encounter: Payer: Self-pay | Admitting: Physician Assistant

## 2021-01-02 ENCOUNTER — Other Ambulatory Visit: Payer: Self-pay

## 2021-01-02 DIAGNOSIS — F410 Panic disorder [episodic paroxysmal anxiety] without agoraphobia: Secondary | ICD-10-CM | POA: Diagnosis not present

## 2021-01-02 DIAGNOSIS — F3132 Bipolar disorder, current episode depressed, moderate: Secondary | ICD-10-CM | POA: Diagnosis not present

## 2021-01-02 NOTE — Telephone Encounter (Signed)
Dr. Charlies Silvers,  Auth Submission: DENIED- Shirlean Kelly Payer: BCBC Medication & CPT/J Code(s) submitted: Feraheme (ferumoxytol) [phone] Route of submission (phone, fax, portal): PORTAL Auth type: Buy/Bill Units/visits requested:2  Denied due to patient has not tried and or failed step therapy. VENOFER FERRICLIT INFED  Would you like to try VENOFER?? Please advise

## 2021-01-04 ENCOUNTER — Ambulatory Visit (INDEPENDENT_AMBULATORY_CARE_PROVIDER_SITE_OTHER): Payer: BC Managed Care – PPO | Admitting: *Deleted

## 2021-01-04 ENCOUNTER — Other Ambulatory Visit: Payer: Self-pay

## 2021-01-04 VITALS — BP 153/95 | HR 88 | Temp 98.9°F | Resp 18 | Ht 66.0 in | Wt 247.8 lb

## 2021-01-04 DIAGNOSIS — E611 Iron deficiency: Secondary | ICD-10-CM

## 2021-01-04 MED ORDER — ACETAMINOPHEN 325 MG PO TABS
650.0000 mg | ORAL_TABLET | Freq: Once | ORAL | Status: AC
  Administered 2021-01-04: 650 mg via ORAL
  Filled 2021-01-04: qty 2

## 2021-01-04 MED ORDER — DIPHENHYDRAMINE HCL 25 MG PO CAPS
50.0000 mg | ORAL_CAPSULE | Freq: Once | ORAL | Status: AC
  Administered 2021-01-04: 50 mg via ORAL
  Filled 2021-01-04: qty 2

## 2021-01-04 MED ORDER — SODIUM CHLORIDE 0.9 % IV SOLN
200.0000 mg | Freq: Once | INTRAVENOUS | Status: AC
  Administered 2021-01-04: 200 mg via INTRAVENOUS
  Filled 2021-01-04: qty 10

## 2021-01-04 NOTE — Progress Notes (Signed)
Diagnosis: Iron Deficiency Anemia  Provider:  Marshell Garfinkel, MD  Procedure: Infusion  IV Type: Peripheral, IV Location: R Forearm  Venofer (Iron Sucrose), Dose: 200 mg  Infusion Start Time: 0240 am  Infusion Stop Time: 1024 am  Post Infusion IV Care: Observation period completed and Peripheral IV Discontinued  Discharge: Condition: Good, Destination: Home . AVS provided to patient.   Performed by:  Oren Beckmann, RN

## 2021-01-11 ENCOUNTER — Other Ambulatory Visit: Payer: Self-pay

## 2021-01-11 ENCOUNTER — Ambulatory Visit (INDEPENDENT_AMBULATORY_CARE_PROVIDER_SITE_OTHER): Payer: BC Managed Care – PPO

## 2021-01-11 VITALS — BP 141/88 | HR 87 | Temp 98.1°F | Resp 16 | Ht 66.0 in | Wt 246.8 lb

## 2021-01-11 DIAGNOSIS — E611 Iron deficiency: Secondary | ICD-10-CM | POA: Diagnosis not present

## 2021-01-11 MED ORDER — METHYLPREDNISOLONE SODIUM SUCC 125 MG IJ SOLR: 125.0000 mg | Freq: Once | INTRAMUSCULAR | Status: DC | PRN

## 2021-01-11 MED ORDER — ACETAMINOPHEN 325 MG PO TABS: 650.0000 mg | ORAL_TABLET | Freq: Once | ORAL | Status: DC

## 2021-01-11 MED ORDER — FAMOTIDINE IN NACL 20-0.9 MG/50ML-% IV SOLN: 20.0000 mg | Freq: Once | INTRAVENOUS | Status: DC | PRN

## 2021-01-11 MED ORDER — SODIUM CHLORIDE 0.9 % IV SOLN: Freq: Once | INTRAVENOUS | Status: DC | PRN

## 2021-01-11 MED ORDER — SODIUM CHLORIDE 0.9 % IV SOLN
200.0000 mg | Freq: Once | INTRAVENOUS | Status: AC
  Administered 2021-01-11: 200 mg via INTRAVENOUS
  Filled 2021-01-11: qty 10

## 2021-01-11 MED ORDER — DIPHENHYDRAMINE HCL 50 MG/ML IJ SOLN: 50.0000 mg | Freq: Once | INTRAMUSCULAR | Status: DC | PRN

## 2021-01-11 MED ORDER — DIPHENHYDRAMINE HCL 25 MG PO CAPS: 50.0000 mg | ORAL_CAPSULE | Freq: Once | ORAL | Status: DC

## 2021-01-11 MED ORDER — EPINEPHRINE 0.3 MG/0.3ML IJ SOAJ: 0.3000 mg | Freq: Once | INTRAMUSCULAR | Status: DC | PRN

## 2021-01-11 MED ORDER — ALBUTEROL SULFATE HFA 108 (90 BASE) MCG/ACT IN AERS: 2.0000 | INHALATION_SPRAY | Freq: Once | RESPIRATORY_TRACT | Status: DC | PRN

## 2021-01-11 NOTE — Progress Notes (Signed)
Diagnosis: Iron Deficiency Anemia  Provider:  Marshell Garfinkel, MD  Procedure: Infusion  IV Type: Peripheral, IV Location: L Forearm  Venofer (Iron Sucrose), Dose: 200 mg  Infusion Start Time: 4314  Infusion Stop Time: 1020  Post Infusion IV Care: Peripheral IV Discontinued  Discharge: Condition: Good, Destination: Home . AVS provided to patient.   Performed by:  Charlie Pitter, RN

## 2021-01-12 ENCOUNTER — Ambulatory Visit (INDEPENDENT_AMBULATORY_CARE_PROVIDER_SITE_OTHER): Payer: BC Managed Care – PPO | Admitting: Gastroenterology

## 2021-01-12 ENCOUNTER — Encounter: Payer: Self-pay | Admitting: Gastroenterology

## 2021-01-12 VITALS — BP 130/88 | HR 96 | Ht 66.0 in | Wt 240.0 lb

## 2021-01-12 DIAGNOSIS — E611 Iron deficiency: Secondary | ICD-10-CM

## 2021-01-12 DIAGNOSIS — K529 Noninfective gastroenteritis and colitis, unspecified: Secondary | ICD-10-CM

## 2021-01-12 MED ORDER — LOPERAMIDE HCL 2 MG PO TABS: 2.0000 mg | ORAL_TABLET | ORAL | 0 refills | Status: DC | PRN

## 2021-01-12 MED ORDER — SUTAB 1479-225-188 MG PO TABS: 1.0000 | ORAL_TABLET | Freq: Once | ORAL | 0 refills | Status: AC

## 2021-01-12 NOTE — Progress Notes (Signed)
HPI :  40 year old female here for a follow-up visit.  I know her for history of iron deficiency anemia for which she went an EGD and colonoscopy with me in 2019 as outlined below without clear cause.  She has a complicated medical history to include bipolar disorder, chronic abdominal pain, pelvic pain, fibromyalgia, hx of IC s/p total cystectomy with [place] pouch. She is s/p hysterectomy, bowel resection when she had a complication from her [place] pouch.. She was on chronic narcotics for several years for chronic pain, has had bilateral celiac plexus blocks in June 2016 and has a neurostimulator in place.  She states she had eventually come off chronic narcotics in August 2021.  Around that time is when her bowels changes she started having loose stools.  Essentially over the past year she has had persistent loose stools.  She states stools are either watery to poorly formed.  She has roughly 10 bowel movements per day on average.  No blood in her stools.  She has significant urgency at times and has had a few accidents but incontinence is not routine or typical.  She has a very hard time eating salads and that is particularly strong food trigger but otherwise denies any other specific foods that would make this worse.  She denies any nocturnal symptoms.  She has occasional lower abdominal cramping with this.  She denies any other changes in her medications that would occur on onset of the symptoms.  She is been taking Bentyl occasionally which she is not really sure helps.  She has had a work-up with stool testing as outlined below which did not show a clear cause.  She had hydrogen breath test which was indeterminate for SIBO.  Her insurance did not cover rifaximin empirically.  She took a trial of Flagyl for 2 weeks and states it did not help her at all.  She was given a 67-month trial of cholestyramine as she does have a history of cholecystectomy in 2019.  She states that also did not provide any  help.  She has taken occasional Imodium but not routinely.  She is followed by urology after her bladder surgery, she has a procedure for that to enveloped some bulking agent into her stoma/ureter.  Otherwise she is status post hysterectomy, does not have any menses.  She has required multiple doses of IV iron infusion this year although she has no anemia and her iron stores more recently looked okay.  No loose stools really bothering her and we talked about other options to evaluate this and treat it.   Prior workup: EGD 01/15/2017 - LA grade A esophagitis, inflammatory nodule at GEJ, normal esophagus otherwise, normal stomach, normal small bowel.    Colonoscopy January 2019 showed the following:   - Patent end-to-end ileo-colonic anastomosis, characterized by healthy appearing mucosa. - The examined portion of the ileum was normal. - One 3 mm polyp in the transverse colon, removed with a cold biopsy forceps. Resected and retrieved. - The examination was otherwise normal on direct and retroflexion views.   Breath test indeterminate TTG negative Fecal elastase Fecal calprotectin 43 C diff negative  Past Medical History:  Diagnosis Date   ADHD (attention deficit hyperactivity disorder)    Allergy    Anemia    Anxiety    Bipolar disorder (HCC)    Chronic back pain    Chronic headaches    Chronic interstitial cystitis    "removed my bladder when I went to end stage  IC"   Depression    Fibromyalgia    GERD (gastroesophageal reflux disease)    Gestational diabetes    resolved   History of blood transfusion 02/2010   "related to OR"   History of kidney stones    HLD (hyperlipidemia)    Hypothyroid    Interstitial cystitis    Self-catheterizes urinary bladder    "I have an [place] pouch; made a bladder using part of my small intestines; cath out of stoma in my belly button" (08/02/2015)   Small bowel obstruction ([place]) 08/03/2015   Thyroid disease      Past Surgical History:   Procedure Laterality Date   ABDOMINAL HYSTERECTOMY  02/2010   "left my ovaries"   APPENDECTOMY  02/2010   BLADDER REMOVAL  02/2010   and [place]  2009   CHOLECYSTECTOMY N/A 05/27/2017   Procedure: LAPAROSCOPIC CHOLECYSTECTOMY;  Surgeon: Ralene Ok, MD;  Location: [facility];  Service: General;  Laterality: N/A;   COLONOSCOPY     [place] pouch     repair of tear   LAPAROSCOPIC LYSIS OF ADHESIONS N/A 05/27/2017   Procedure: LAPAROSCOPIC LYSIS OF ADHESIONS;  Surgeon: Ralene Ok, MD;  Location: [facility];  Service: General;  Laterality: N/A;   OTHER SURGICAL HISTORY     remval of urethra when bladder was removed   SMALL INTESTINE SURGERY  2017   for bowel obstruction   SPINAL CORD STIMULATOR IMPLANT  09/2009; ~ 04/2015   SPINAL CORD STIMULATOR REMOVAL  ~ 04/2015   UPPER GI ENDOSCOPY     WISDOM TOOTH EXTRACTION     Family History  Problem Relation Age of Onset   Lung cancer Mother        smoker   Other Father        benigh tumor between heart and lungs, had surgery and something went wrong, he died at 29   Diabetes Paternal Grandmother    Heart disease Paternal Grandmother    Diabetes Paternal Grandfather    Heart disease Paternal Grandfather    Pancreatic cancer Paternal Grandfather    Liver cancer Paternal Grandfather    Breast cancer Maternal Aunt    Colon cancer Neg Hx    Esophageal cancer Neg Hx    Prostate cancer Neg Hx    Rectal cancer Neg Hx    Stomach cancer Neg Hx    Social History   Tobacco Use   Smoking status: Never   Smokeless tobacco: Never  Vaping Use   Vaping Use: Never used  Substance Use Topics   Alcohol use: Yes    Comment: 08/03/2015 "glass of wine maybe twice/month"   Drug use: No   Current Outpatient Medications  Medication Sig Dispense Refill   amphetamine-dextroamphetamine (ADDERALL) 30 MG tablet Take 30 mg by mouth 2 (two) times daily.     clonazePAM (KLONOPIN) 1 MG tablet Take 1 mg by mouth 2 (two) times daily as  needed.     dicyclomine (BENTYL) 10 MG capsule TAKE 1 CAPSULE BY MOUTH 2 TIMES DAILY. 180 capsule 0   estradiol (ESTRACE) 0.5 MG tablet TAKE ONE TABLET AS NEEDED ONCE A DAY FOR "MENOPAUSAL SYMPTOMS"     fenofibrate 160 MG tablet Take 160 mg by mouth daily.     levonorgestrel-ethinyl estradiol (SEASONALE,INTROVALE,JOLESSA) 0.15-0.03 MG tablet Take 1 tablet by mouth daily.     levothyroxine (SYNTHROID, LEVOTHROID) 137 MCG tablet Take 137 mcg by mouth daily before breakfast.      lumateperone tosylate (  CAPLYTA) 42 MG capsule Take 1 capsule by mouth daily.     metoprolol succinate (TOPROL-XL) 100 MG 24 hr tablet Take 100 mg by mouth daily.     ondansetron (ZOFRAN-ODT) 4 MG disintegrating tablet Take 1 tablet (4 mg total) by mouth every 8 (eight) hours as needed for nausea or vomiting. 24 tablet 3   rOPINIRole (REQUIP) 1 MG tablet Take 1 mg by mouth at bedtime.     rosuvastatin (CRESTOR) 5 MG tablet Take 1 tablet by mouth daily.     tiZANidine (ZANAFLEX) 4 MG capsule Take 4 mg by mouth 2 (two) times daily.      No current facility-administered medications for this visit.   Allergies  Allergen Reactions   Keflex [Cephalexin] Hives   Penicillins Hives and Other (See Comments)    PATIENT HAS HAD A PCN REACTION WITH IMMEDIATE RASH, FACIAL/TONGUE/THROAT SWELLING, SOB, OR LIGHTHEADEDNESS WITH HYPOTENSION:  #  #  #  YES  #  #  #   Has patient had a PCN reaction causing severe rash involving mucus membranes or skin necrosis: No Has patient had a PCN reaction that required hospitalization No Has patient had a PCN reaction occurring within the last 10 years: No If all of the above answers are "NO", then may proceed with Cephalosporin use.    Sulfa Antibiotics Hives     Review of Systems: All systems reviewed and negative except where noted in HPI.   Lab Results  Component Value Date   WBC 9.3 12/30/2020   HGB 14.6 12/30/2020   HCT 44.5 12/30/2020   MCV 85.4 12/30/2020   PLT 393 12/30/2020     Lab Results  Component Value Date   ALT 29 12/30/2020   AST 28 12/30/2020   ALKPHOS 59 12/30/2020   BILITOT 0.4 12/30/2020    Lab Results  Component Value Date   CREATININE 0.87 12/30/2020   BUN 12 12/30/2020   NA 139 12/30/2020   K 4.3 12/30/2020   CL 105 12/30/2020   CO2 23 12/30/2020     Physical Exam: BP 130/88   Pulse 96   Ht 5\' 6"  (1.676 m)   Wt 240 lb (108.9 kg)   BMI 38.74 kg/m  Constitutional: Pleasant,well-developed, female in no acute distress. Neurological: Alert and oriented to person place and time. Psychiatric: Normal mood and affect. Behavior is normal.   ASSESSMENT AND PLAN: 40 year old female here for reassessment of the following issues:  Chronic diarrhea Iron deficiency  As above, complex surgical history, chronic pain on chronic narcotics but since removing morphine from her regimen over a year ago has had chronic diarrhea.  Prior EGD and colonoscopy 2018/2019 did not show a clear cause for iron deficiency but her bowel function was normal at that time.  She has severe diarrhea, has had negative work-up with stool studies and serologies as above.  She has failed empiric regimens with cholestyramine, Flagyl.  Given lack of response to Flagyl I think SIBO is less likely.  We discussed options to evaluate this.  Given her worsening symptoms along with continued need for IV iron infusion in the setting of hysterectomy, recommending EGD and colonoscopy to further evaluate.  I discussed risk benefits of the procedures and anesthesia and she wants to proceed.  If these exams are negative, then would pursue a capsule endoscopy.  We will plan on biopsies to rule out microscopic colitis and small bowel biopsies as well.  In the interim would recommend scheduled Imodium 1-2 tabs every  morning and titrate up as needed and see if that will help her.  Provided information on a low FODMAP diet to see if there is any dietary contributor to this as well.  She agreed  with the plan, further recommendations pending results.  Jolly Mango, MD [facility]

## 2021-01-12 NOTE — Patient Instructions (Signed)
If you are age 41 or older, your body mass index should be between 23-30. Your Body mass index is 38.74 kg/m. If this is out of the aforementioned range listed, please consider follow up with your Primary Care Provider.  If you are age 74 or younger, your body mass index should be between 19-25. Your Body mass index is 38.74 kg/m. If this is out of the aformentioned range listed, please consider follow up with your Primary Care Provider.   ________________________________________________________  The [facility] providers would like to encourage you to use [facility] to communicate with providers for non-urgent requests or questions.  Due to long hold times on the telephone, sending your provider a message by [facility] may be a faster and more efficient way to get a response.  Please allow 48 business hours for a response.  Please remember that this is for non-urgent requests.  _______________________________________________________  Dennis Bast have been scheduled for an endoscopy and colonoscopy. Please follow written instructions given to you at your visit today. If you use inhalers (even only as needed), please bring them with you on the day of your procedure.  Please purchase the following medications over the counter and take as directed: Imodium: Take one to two  tablets every morning and titrate as needed. Please discontinue prior to your colonoscopy.  We are giving you a Low-FODMAP diet handout today. FODMAPs are short-chain carbohydrates (sugars) that are highly fermentable, which means that they go through chemical changes in the GI system, and are poorly absorbed during digestion. When FODMAPs reach the colon (large intestine), bacteria ferment these sugars, turning them into gas and chemicals. This stretches the walls of the colon, causing abdominal bloating, distension, cramping, pain, and/or changes in bowel habits in many patients with IBS. FODMAPs are not unhealthy or harmful, but may exacerbate  GI symptoms in those with sensitive GI tracts.  Thank you for entrusting me with your care and for choosing [facility], Dr. [place] Cellar

## 2021-01-18 ENCOUNTER — Ambulatory Visit (INDEPENDENT_AMBULATORY_CARE_PROVIDER_SITE_OTHER): Payer: BC Managed Care – PPO | Admitting: *Deleted

## 2021-01-18 ENCOUNTER — Other Ambulatory Visit: Payer: Self-pay

## 2021-01-18 VITALS — BP 133/89 | HR 92 | Temp 99.3°F | Resp 20 | Ht 66.0 in | Wt 239.0 lb

## 2021-01-18 DIAGNOSIS — E611 Iron deficiency: Secondary | ICD-10-CM | POA: Diagnosis not present

## 2021-01-18 MED ORDER — ACETAMINOPHEN 325 MG PO TABS: 650.0000 mg | ORAL_TABLET | Freq: Once | ORAL | Status: DC

## 2021-01-18 MED ORDER — FAMOTIDINE IN NACL 20-0.9 MG/50ML-% IV SOLN: 20.0000 mg | Freq: Once | INTRAVENOUS | Status: DC | PRN

## 2021-01-18 MED ORDER — ALBUTEROL SULFATE HFA 108 (90 BASE) MCG/ACT IN AERS: 2.0000 | INHALATION_SPRAY | Freq: Once | RESPIRATORY_TRACT | Status: DC | PRN

## 2021-01-18 MED ORDER — SODIUM CHLORIDE 0.9 % IV SOLN
200.0000 mg | Freq: Once | INTRAVENOUS | Status: AC
  Administered 2021-01-18: 200 mg via INTRAVENOUS
  Filled 2021-01-18: qty 10

## 2021-01-18 MED ORDER — EPINEPHRINE 0.3 MG/0.3ML IJ SOAJ: 0.3000 mg | Freq: Once | INTRAMUSCULAR | Status: DC | PRN

## 2021-01-18 MED ORDER — METHYLPREDNISOLONE SODIUM SUCC 125 MG IJ SOLR: 125.0000 mg | Freq: Once | INTRAMUSCULAR | Status: DC | PRN

## 2021-01-18 MED ORDER — DIPHENHYDRAMINE HCL 50 MG/ML IJ SOLN: 50.0000 mg | Freq: Once | INTRAMUSCULAR | Status: DC | PRN

## 2021-01-18 MED ORDER — DIPHENHYDRAMINE HCL 25 MG PO CAPS: 50.0000 mg | ORAL_CAPSULE | Freq: Once | ORAL | Status: DC

## 2021-01-18 MED ORDER — SODIUM CHLORIDE 0.9 % IV SOLN: Freq: Once | INTRAVENOUS | Status: DC | PRN

## 2021-01-18 NOTE — Progress Notes (Signed)
Diagnosis: Iron Deficiency Anemia  Provider:  Marshell Garfinkel, MD  Procedure: Infusion  IV Type: Peripheral, IV Location: R Forearm  Venofer (Iron Sucrose), Dose: 200 mg  Infusion Start Time: 5284 am  Infusion Stop Time: 1324 am  Post Infusion IV Care: Observation period completed and Peripheral IV Discontinued  Discharge: Condition: Good, Destination: Home . AVS provided to patient.   Performed by:  Oren Beckmann, RN

## 2021-01-19 ENCOUNTER — Encounter: Payer: Self-pay | Admitting: Physician Assistant

## 2021-01-19 DIAGNOSIS — N393 Stress incontinence (female) (male): Secondary | ICD-10-CM | POA: Diagnosis not present

## 2021-01-19 DIAGNOSIS — Z936 Other artificial openings of urinary tract status: Secondary | ICD-10-CM | POA: Diagnosis not present

## 2021-01-19 HISTORY — PX: OTHER SURGICAL HISTORY: SHX169

## 2021-01-20 ENCOUNTER — Ambulatory Visit (AMBULATORY_SURGERY_CENTER): Payer: BC Managed Care – PPO | Admitting: Gastroenterology

## 2021-01-20 ENCOUNTER — Encounter: Payer: Self-pay | Admitting: Gastroenterology

## 2021-01-20 ENCOUNTER — Other Ambulatory Visit: Payer: Self-pay

## 2021-01-20 ENCOUNTER — Other Ambulatory Visit: Payer: Self-pay | Admitting: Gastroenterology

## 2021-01-20 VITALS — BP 122/83 | HR 77 | Temp 96.2°F | Resp 13 | Ht 66.0 in | Wt 240.0 lb

## 2021-01-20 DIAGNOSIS — E611 Iron deficiency: Secondary | ICD-10-CM | POA: Diagnosis not present

## 2021-01-20 DIAGNOSIS — K648 Other hemorrhoids: Secondary | ICD-10-CM | POA: Diagnosis not present

## 2021-01-20 DIAGNOSIS — K529 Noninfective gastroenteritis and colitis, unspecified: Secondary | ICD-10-CM

## 2021-01-20 DIAGNOSIS — D123 Benign neoplasm of transverse colon: Secondary | ICD-10-CM | POA: Diagnosis not present

## 2021-01-20 DIAGNOSIS — K317 Polyp of stomach and duodenum: Secondary | ICD-10-CM

## 2021-01-20 DIAGNOSIS — K2970 Gastritis, unspecified, without bleeding: Secondary | ICD-10-CM | POA: Diagnosis not present

## 2021-01-20 DIAGNOSIS — K2100 Gastro-esophageal reflux disease with esophagitis, without bleeding: Secondary | ICD-10-CM | POA: Diagnosis not present

## 2021-01-20 DIAGNOSIS — R1030 Lower abdominal pain, unspecified: Secondary | ICD-10-CM

## 2021-01-20 DIAGNOSIS — K2980 Duodenitis without bleeding: Secondary | ICD-10-CM

## 2021-01-20 DIAGNOSIS — K319 Disease of stomach and duodenum, unspecified: Secondary | ICD-10-CM | POA: Diagnosis not present

## 2021-01-20 DIAGNOSIS — K5730 Diverticulosis of large intestine without perforation or abscess without bleeding: Secondary | ICD-10-CM

## 2021-01-20 MED ORDER — SODIUM CHLORIDE 0.9 % IV SOLN: 500.0000 mL | Freq: Once | INTRAVENOUS | Status: DC

## 2021-01-20 MED ORDER — OMEPRAZOLE 40 MG PO CPDR: 40.0000 mg | DELAYED_RELEASE_CAPSULE | Freq: Every day | ORAL | 0 refills | Status: DC

## 2021-01-20 NOTE — Progress Notes (Signed)
History and Physical Interval Note: Patient see non 01/12/21 - see that note for details of her case. There are no interval changes since I have seen her. EGD and colonoscopy to evaluate chronic diarrhea and history of IDA.  01/20/2021 7:36 AM  [facility]  has presented today for endoscopic procedure(s), with the diagnosis of  Encounter Diagnoses  Name Primary?   Chronic diarrhea Yes   Iron deficiency    Lower abdominal pain   .  The various methods of evaluation and treatment have been discussed with the patient and/or family. After consideration of risks, benefits and other options for treatment, the patient has consented to  the endoscopic procedure(s).   The patient's history has been reviewed, patient examined, no change in status, stable for surgery.  I have reviewed the patient's chart and labs.  Questions were answered to the patient's satisfaction.    Jolly Mango, MD [facility]

## 2021-01-20 NOTE — Op Note (Signed)
[place] [place] Patient Name: [name] Procedure Date: 01/20/2021 7:37 AM MRN: [id number] Endoscopist: Remo Lipps P. Havery Moros , MD Age: 40 Referring MD:  Date of Birth: 1980/11/16 Gender: Female Account #: [id number] Procedure:                Upper GI endoscopy Indications:              Iron deficiency anemia, Diarrhea Medicines:                Monitored Anesthesia Care Procedure:                Pre-Anesthesia Assessment:                           - Prior to the procedure, a History and Physical                            was performed, and patient medications and                            allergies were reviewed. The patient's tolerance of                            previous anesthesia was also reviewed. The risks                            and benefits of the procedure and the sedation                            options and risks were discussed with the patient.                            All questions were answered, and informed consent                            was obtained. Prior Anticoagulants: The patient has                            taken no previous anticoagulant or antiplatelet                            agents. ASA Grade Assessment: II - A patient with                            mild systemic disease. After reviewing the risks                            and benefits, the patient was deemed in                            satisfactory condition to undergo the procedure.                           After obtaining informed consent, the endoscope was  passed under direct vision. Throughout the                            procedure, the patient's blood pressure, pulse, and                            oxygen saturations were monitored continuously. The                            GIF [id number] was introduced through the                            mouth, and advanced to the second part of duodenum.                            The upper GI  endoscopy was accomplished without                            difficulty. The patient tolerated the procedure                            well. Scope In: Scope Out: Findings:                 Esophagogastric landmarks were identified: the                            Z-line was found at 40 cm, the gastroesophageal                            junction was found at 40 cm and the upper extent of                            the gastric folds was found at 40 cm from the                            incisors.                           LA Grade A esophagitis was found 40 cm from the                            incisors. There was some nodular tissue at the GEJ                            in this area, suspect inflammatory changes.                            Biopsies were taken with a cold forceps for                            histology.                           The exam of  the esophagus was otherwise normal.                           Patchy mild inflammation characterized by adherent                            blood and friability was found in the gastric                            antrum.                           The exam of the stomach was otherwise normal.                           Biopsies were taken with a cold forceps in the                            gastric body, at the incisura and in the gastric                            antrum for Helicobacter pylori testing.                           The duodenal bulb and second portion of the                            duodenum were normal. Biopsies for histology were                            taken with a cold forceps for evaluation of celiac                            disease. Complications:            No immediate complications. Estimated blood loss:                            Minimal. Estimated Blood Loss:     Estimated blood loss was minimal. Impression:               - Esophagogastric landmarks identified.                           - LA Grade A  reflux esophagitis with suspect                            inflammatory nodularity. Biopsied.                           - Gastritis. Biopsies taken to rule out H pylori                           - Normal duodenal bulb and second portion of the  duodenum. Biopsied.                           It's possible that gastritis could be related to                            iron deficiency. Recommendation:           - Patient has a contact number available for                            emergencies. The signs and symptoms of potential                            delayed complications were discussed with the                            patient. Return to normal activities tomorrow.                            Written discharge instructions were provided to the                            patient.                           - Resume previous diet.                           - Continue present medications.                           - Start omeprazole 40mg  once daily for 6 weeks                           - Avoid NSAIDs                           - Await pathology results. Remo Lipps P. Dontarious Schaum, MD 01/20/2021 8:18:13 AM This report has been signed electronically.

## 2021-01-20 NOTE — Op Note (Signed)
[facility] Patient Name: [name] Procedure Date: 01/20/2021 7:36 AM MRN: [id number] Endoscopist: [name] , MD Age: 40 Referring MD:  Date of Birth: [birth year] Gender: Female Account #: [id number] Procedure:                Colonoscopy Indications:              Chronic diarrhea, Iron deficiency anemia Medicines:                Monitored Anesthesia Care Procedure:                Pre-Anesthesia Assessment:                           - Prior to the procedure, a History and Physical                            was performed, and patient medications and                            allergies were reviewed. The patient's tolerance of                            previous anesthesia was also reviewed. The risks                            and benefits of the procedure and the sedation                            options and risks were discussed with the patient.                            All questions were answered, and informed consent                            was obtained. Prior Anticoagulants: The patient has                            taken no previous anticoagulant or antiplatelet                            agents. ASA Grade Assessment: II - A patient with                            mild systemic disease. After reviewing the risks                            and benefits, the patient was deemed in                            satisfactory condition to undergo the procedure.                           After obtaining informed consent, the colonoscope  was passed under direct vision. Throughout the                            procedure, the patient's blood pressure, pulse, and                            oxygen saturations were monitored continuously. The                            Olympus PCF-H190DL (#[id number]) Colonoscope was                            introduced through the anus and advanced to the the                            terminal  ileum. The colonoscopy was performed                            without difficulty. The patient tolerated the                            procedure well. The quality of the bowel                            preparation was adequate. The terminal ileum and                            the rectum, surgical anastomosis were photographed. Scope In: 7:51:34 AM Scope Out: 8:07:33 AM Scope Withdrawal Time: 0 hours 12 minutes 59 seconds  Total Procedure Duration: 0 hours 15 minutes 59 seconds  Findings:                 The perianal and digital rectal examinations were                            normal.                           The terminal ileum appeared normal.                           There was evidence of a prior end-to-side                            ileo-colonic anastomosis in the ascending colon.                            This was patent and was characterized by healthy                            appearing mucosa.                           A few medium-mouthed diverticula were found in the  right colon.                           A 3 mm polyp was found in the transverse colon. The                            polyp was sessile. The polyp was removed with a                            cold biopsy forceps. Resection and retrieval were                            complete. I thought a photo of it was taken, but it                            was not. It was benign appearing.                           Internal hemorrhoids were found during retroflexion.                           The exam was otherwise without abnormality.                           Biopsies for histology were taken with a cold                            forceps from the right colon, left colon and                            transverse colon for evaluation of microscopic                            colitis. Complications:            No immediate complications. Estimated blood loss:                             Minimal. Estimated Blood Loss:     Estimated blood loss was minimal. Impression:               - The examined portion of the ileum was normal.                           - Patent end-to-side ileo-colonic anastomosis,                            characterized by healthy appearing mucosa.                           - Diverticulosis in the right colon.                           - One 3 mm polyp in the transverse colon, removed  with a cold biopsy forceps. Resected and retrieved.                           - Internal hemorrhoids.                           - The examination was otherwise normal.                           - Biopsies were taken with a cold forceps from the                            right colon, left colon and transverse colon for                            evaluation of microscopic colitis.                           No overt inflammatory changes. No clear cause for                            iron deficiency on this exam. Recommendation:           - Patient has a contact number available for                            emergencies. The signs and symptoms of potential                            delayed complications were discussed with the                            patient. Return to normal activities tomorrow.                            Written discharge instructions were provided to the                            patient.                           - Resume previous diet.                           - Continue present medications.                           - Await pathology results with further                            recommendations regarding long term management of                            loose stools. Lindsay Lipps P. Lindsay Marconi, MD 01/20/2021 8:13:40 AM This report has been signed electronically.

## 2021-01-20 NOTE — Progress Notes (Signed)
A and O x3. Report to RN. Tolerated MAC anesthesia well.Teeth unchanged after procedure. 

## 2021-01-20 NOTE — Progress Notes (Signed)
Called to room to assist during endoscopic procedure.  Patient ID and intended procedure confirmed with present staff. Received instructions for my participation in the procedure from the performing physician.  

## 2021-01-20 NOTE — Patient Instructions (Addendum)
Information on polyps, diverticulosis, gastritis and hemorrhoids given to you today.  Await pathology results.  Resume previous diet and medications.  Start Omepeprazole 40 mg once a day for 6 weeks.   Avoid NSAIDS (Aspirin, Ibuprofen, Aleve, Naproxen), you may use Tylenol as needed.     YOU HAD AN ENDOSCOPIC PROCEDURE TODAY AT [facility]:   Refer to the procedure report that was given to you for any specific questions about what was found during the examination.  If the procedure report does not answer your questions, please call your gastroenterologist to clarify.  If you requested that your care partner not be given the details of your procedure findings, then the procedure report has been included in a sealed envelope for you to review at your convenience later.  YOU SHOULD EXPECT: Some feelings of bloating in the abdomen. Passage of more gas than usual.  Walking can help get rid of the air that was put into your GI tract during the procedure and reduce the bloating. If you had a lower endoscopy (such as a colonoscopy or flexible sigmoidoscopy) you may notice spotting of blood in your stool or on the toilet paper. If you underwent a bowel prep for your procedure, you may not have a normal bowel movement for a few days.  Please Note:  You might notice some irritation and congestion in your nose or some drainage.  This is from the oxygen used during your procedure.  There is no need for concern and it should clear up in a day or so.  SYMPTOMS TO REPORT IMMEDIATELY:  Following lower endoscopy (colonoscopy or flexible sigmoidoscopy):  Excessive amounts of blood in the stool  Significant tenderness or worsening of abdominal pains  Swelling of the abdomen that is new, acute  Fever of 100F or higher  Following upper endoscopy (EGD)  Vomiting of blood or coffee ground material  New chest pain or pain under the shoulder blades  Painful or persistently difficult  swallowing  New shortness of breath  Fever of 100F or higher  Black, tarry-looking stools  For urgent or emergent issues, a gastroenterologist can be reached at any hour by calling [phone]. Do not use MyChart messaging for urgent concerns.    DIET:  We do recommend a small meal at first, but then you may proceed to your regular diet.  Drink plenty of fluids but you should avoid alcoholic beverages for 24 hours.  ACTIVITY:  You should plan to take it easy for the rest of today and you should NOT DRIVE or use heavy machinery until tomorrow (because of the sedation medicines used during the test).    FOLLOW UP: Our staff will call the number listed on your records 48-72 hours following your procedure to check on you and address any questions or concerns that you may have regarding the information given to you following your procedure. If we do not reach you, we will leave a message.  We will attempt to reach you two times.  During this call, we will ask if you have developed any symptoms of COVID 19. If you develop any symptoms (ie: fever, flu-like symptoms, shortness of breath, cough etc.) before then, please call [phone].  If you test positive for Covid 19 in the 2 weeks post procedure, please call and report this information to [place].    If any biopsies were taken you will be contacted by phone or by letter within the next 1-3 weeks.  Please call us  at [phone] if you have not heard about the biopsies in 3 weeks.    SIGNATURES/CONFIDENTIALITY: You and/or your care partner have signed paperwork which will be entered into your electronic medical record.  These signatures attest to the fact that that the information above on your After Visit Summary has been reviewed and is understood.  Full responsibility of the confidentiality of this discharge information lies with you and/or your care-partner.

## 2021-01-20 NOTE — Progress Notes (Signed)
Pt's states no medical or surgical changes since previsit or office visit.  Vitals DT 

## 2021-01-24 ENCOUNTER — Telehealth: Payer: Self-pay | Admitting: *Deleted

## 2021-01-24 NOTE — Telephone Encounter (Signed)
  Follow up Call-  Call back number 01/20/2021  Post procedure Call Back phone  # [phone]  Permission to leave phone message Yes  Some recent data might be hidden     Patient questions:  Do you have a fever, pain , or abdominal swelling? No. Pain Score  0 *  Have you tolerated food without any problems? Yes.    Have you been able to return to your normal activities? Yes.    Do you have any questions about your discharge instructions: Diet   No. Medications  No. Follow up visit  No.  Do you have questions or concerns about your Care? No.  Actions: * If pain score is 4 or above: No action needed, pain <4.  Have you developed a fever since your procedure? no  2.   Have you had an respiratory symptoms (SOB or cough) since your procedure? no  3.   Have you tested positive for COVID 19 since your procedure no  4.   Have you had any family members/close contacts diagnosed with the COVID 19 since your procedure?  no   If yes to any of these questions please route to Joylene John, RN and Joella Prince, RN

## 2021-01-25 ENCOUNTER — Other Ambulatory Visit: Payer: Self-pay

## 2021-01-25 ENCOUNTER — Ambulatory Visit (INDEPENDENT_AMBULATORY_CARE_PROVIDER_SITE_OTHER): Payer: BC Managed Care – PPO | Admitting: *Deleted

## 2021-01-25 VITALS — BP 150/99 | HR 96 | Temp 99.2°F | Resp 16 | Ht 66.0 in | Wt 235.6 lb

## 2021-01-25 DIAGNOSIS — E611 Iron deficiency: Secondary | ICD-10-CM | POA: Diagnosis not present

## 2021-01-25 MED ORDER — SODIUM CHLORIDE 0.9 % IV SOLN
200.0000 mg | Freq: Once | INTRAVENOUS | Status: AC
  Administered 2021-01-25: 200 mg via INTRAVENOUS
  Filled 2021-01-25: qty 10

## 2021-01-25 NOTE — Progress Notes (Signed)
Diagnosis: Iron Deficiency Anemia  Provider:  Marshell Garfinkel, MD  Procedure: Infusion  IV Type: Peripheral, IV Location: R Antecubital  Venofer (Iron Sucrose), Dose: 200 mg  Infusion Start Time: 7209 am  Infusion Stop Time: 111 am  Post Infusion IV Care: Observation period completed and Peripheral IV Discontinued  Discharge: Condition: Good, Destination: Home . AVS provided to patient.   Performed by:  Oren Beckmann, RN

## 2021-01-30 DIAGNOSIS — R339 Retention of urine, unspecified: Secondary | ICD-10-CM | POA: Diagnosis not present

## 2021-01-31 ENCOUNTER — Other Ambulatory Visit: Payer: Self-pay

## 2021-01-31 ENCOUNTER — Encounter: Payer: Self-pay | Admitting: Gastroenterology

## 2021-01-31 DIAGNOSIS — D509 Iron deficiency anemia, unspecified: Secondary | ICD-10-CM

## 2021-01-31 MED ORDER — COLESTIPOL HCL 1 G PO TABS: 1.0000 g | ORAL_TABLET | Freq: Two times a day (BID) | ORAL | 2 refills | Status: DC

## 2021-02-01 ENCOUNTER — Other Ambulatory Visit: Payer: Self-pay

## 2021-02-01 ENCOUNTER — Ambulatory Visit (INDEPENDENT_AMBULATORY_CARE_PROVIDER_SITE_OTHER): Payer: BC Managed Care – PPO

## 2021-02-01 VITALS — BP 143/93 | HR 78 | Temp 99.0°F | Resp 16 | Ht 66.0 in | Wt 235.0 lb

## 2021-02-01 DIAGNOSIS — E611 Iron deficiency: Secondary | ICD-10-CM | POA: Diagnosis not present

## 2021-02-01 MED ORDER — FAMOTIDINE IN NACL 20-0.9 MG/50ML-% IV SOLN: 20.0000 mg | Freq: Once | INTRAVENOUS | Status: DC | PRN

## 2021-02-01 MED ORDER — SODIUM CHLORIDE 0.9 % IV SOLN: Freq: Once | INTRAVENOUS | Status: DC | PRN

## 2021-02-01 MED ORDER — METHYLPREDNISOLONE SODIUM SUCC 125 MG IJ SOLR: 125.0000 mg | Freq: Once | INTRAMUSCULAR | Status: DC | PRN

## 2021-02-01 MED ORDER — DIPHENHYDRAMINE HCL 50 MG/ML IJ SOLN: 50.0000 mg | Freq: Once | INTRAMUSCULAR | Status: DC | PRN

## 2021-02-01 MED ORDER — SODIUM CHLORIDE 0.9 % IV SOLN
200.0000 mg | Freq: Once | INTRAVENOUS | Status: AC
  Administered 2021-02-01: 200 mg via INTRAVENOUS
  Filled 2021-02-01: qty 10

## 2021-02-01 MED ORDER — EPINEPHRINE 0.3 MG/0.3ML IJ SOAJ: 0.3000 mg | Freq: Once | INTRAMUSCULAR | Status: DC | PRN

## 2021-02-01 MED ORDER — ALBUTEROL SULFATE HFA 108 (90 BASE) MCG/ACT IN AERS: 2.0000 | INHALATION_SPRAY | Freq: Once | RESPIRATORY_TRACT | Status: DC | PRN

## 2021-02-01 MED ORDER — ACETAMINOPHEN 325 MG PO TABS: 650.0000 mg | ORAL_TABLET | Freq: Once | ORAL | Status: DC

## 2021-02-01 MED ORDER — DIPHENHYDRAMINE HCL 25 MG PO CAPS: 50.0000 mg | ORAL_CAPSULE | Freq: Once | ORAL | Status: DC

## 2021-02-01 NOTE — Progress Notes (Signed)
Diagnosis: Iron Deficiency Anemia  Provider:  Marshell Garfinkel, MD  Procedure: Infusion  IV Type: Peripheral, IV Location: R Forearm  Venofer (Iron Sucrose), Dose: 200 mg  Infusion Start Time: 5361  Infusion Stop Time: 4431  Post Infusion IV Care: Peripheral IV Discontinued  Discharge: Condition: Good, Destination: Home . AVS provided to patient.   Performed by:  Ashtan Laton, Sherlon Handing, LPN

## 2021-02-06 DIAGNOSIS — F314 Bipolar disorder, current episode depressed, severe, without psychotic features: Secondary | ICD-10-CM | POA: Diagnosis not present

## 2021-02-06 DIAGNOSIS — F900 Attention-deficit hyperactivity disorder, predominantly inattentive type: Secondary | ICD-10-CM | POA: Diagnosis not present

## 2021-02-06 DIAGNOSIS — G2401 Drug induced subacute dyskinesia: Secondary | ICD-10-CM | POA: Diagnosis not present

## 2021-02-06 DIAGNOSIS — F410 Panic disorder [episodic paroxysmal anxiety] without agoraphobia: Secondary | ICD-10-CM | POA: Diagnosis not present

## 2021-02-11 ENCOUNTER — Other Ambulatory Visit: Payer: Self-pay | Admitting: Gastroenterology

## 2021-02-11 DIAGNOSIS — K2970 Gastritis, unspecified, without bleeding: Secondary | ICD-10-CM

## 2021-02-13 ENCOUNTER — Encounter: Payer: Self-pay | Admitting: Neurology

## 2021-02-14 ENCOUNTER — Encounter: Payer: Self-pay | Admitting: Neurology

## 2021-02-14 ENCOUNTER — Ambulatory Visit (INDEPENDENT_AMBULATORY_CARE_PROVIDER_SITE_OTHER): Payer: BC Managed Care – PPO | Admitting: Neurology

## 2021-02-14 ENCOUNTER — Other Ambulatory Visit: Payer: Self-pay

## 2021-02-14 VITALS — BP 122/90 | HR 93 | Ht 66.0 in | Wt 227.0 lb

## 2021-02-14 DIAGNOSIS — R0683 Snoring: Secondary | ICD-10-CM | POA: Diagnosis not present

## 2021-02-14 DIAGNOSIS — G478 Other sleep disorders: Secondary | ICD-10-CM | POA: Diagnosis not present

## 2021-02-14 DIAGNOSIS — M359 Systemic involvement of connective tissue, unspecified: Secondary | ICD-10-CM

## 2021-02-14 DIAGNOSIS — D500 Iron deficiency anemia secondary to blood loss (chronic): Secondary | ICD-10-CM

## 2021-02-14 DIAGNOSIS — G9332 Myalgic encephalomyelitis/chronic fatigue syndrome: Secondary | ICD-10-CM | POA: Diagnosis not present

## 2021-02-14 DIAGNOSIS — G2581 Restless legs syndrome: Secondary | ICD-10-CM

## 2021-02-14 DIAGNOSIS — D509 Iron deficiency anemia, unspecified: Secondary | ICD-10-CM | POA: Insufficient documentation

## 2021-02-14 NOTE — Patient Instructions (Signed)

## 2021-02-14 NOTE — Progress Notes (Signed)
SLEEP MEDICINE CLINIC    Provider:  Larey Seat, MD  Primary Care Physician:  Prince Solian, [place] [address]     Referring Provider: Prince Solian, [address] [place],  [place] [address]          Chief Complaint according to patient   Patient presents with:     New Patient (Initial Visit)     Snoring when overweight- has lost some weight. Some medication were changed since the referral was made, chronic illnesses contributing to fatigue. Husband has not witnessed apnea.       HISTORY OF PRESENT ILLNESS:  [name] is a 40 y.o. Caucasian female patient seen here on 02-14-2021 upon referral for a sleep consultation. as a referral on 02/14/2021. Chief concern according to patient :  " I have iron infusions every 3 months, intestinal blees suspected, GI work up, has interstitial cystitis, lost her bladder, has an [place], lost urethra, appendix, uterus, and urinary bladder ( 12/23/ 05-23-09) , cholecystectomy in May 23, 2017,    I have the pleasure of seeing [name] today, a right-handed Caucasian female with a possible sleep disorder.  She  has a past medical history of ADHD (attention deficit hyperactivity disorder), Allergy, Anemia, Anxiety, Bipolar disorder ([place]), Chronic back pain, Chronic headaches, Chronic interstitial cystitis, Depression, Fibromyalgia- Dr Estanislado Pandy, GERD (gastroesophageal reflux disease), Gestational diabetes, History of blood transfusion (02/2010), History of kidney stones, HLD (hyperlipidemia), Hypothyroid, Interstitial cystitis, Self-catheterizes urinary bladder, Small bowel obstruction ([place]) (08/03/2015), and Thyroid disease.     Sleep relevant medical history: Nocturia/- one, at 5 AM , Sleep walking childhood/ , Night terrors- post trauma.Interstim from 23-May-2008 05-23-17- Family medical /sleep history:  No other family member on CPAP with OSA, insomnia, sleep walkers. only child, parents died young, father at  38, when patient was 43 year old, of a tumor. Mother died of lung cancer in 2006-05-23. She left her when she was 64 months old. Raised by paternal grandparents.    Social history:  Patient is  retired from [employer], disabled.  She lives in a household with spouse and one child. And 2 [place] shepherds. Tobacco use none.  ETOH use none, Caffeine intake in form of Coffee( 1 cup a day) . Regular exercise in form of walking, with the dogs. .   Sleep habits are as follows: The patient's dinner time is between 5-7 PM. The patient goes to bed at 10.30 PM and continues to sleep for 6  hours, wakes at 5 AM for one bathroom breaks- the end of her night.    The preferred sleep position is right side, with the support of flat bed, 1 pillow . Dreams are reportedly infrequent/vivid.  5  AM is the usual rise time. The patient wakes up spontaneously/ 5 AM.  She reports not feeling refreshed or restored in AM, with symptoms such as dry mouth, morning headaches, and residual fatigue. Naps are taken frequently, lasting from 30 to 60 minutes after lunch-and are not more refreshing than nocturnal sleep.    Review of Systems: Out of a complete 14 system review, the patient complains of only the following symptoms, and all other reviewed systems are negative.:  Fatigue, sleepiness , snoring,  Before her bipolar medication was changed she had fragmented sleep, Insomnia - Trazodone was stopped.    How likely are you to doze in the following situations: 0 = not likely, 1 = slight chance, 2 = moderate chance,  3 = high chance   Sitting and Reading? Watching Television? Sitting inactive in a public place (theater or meeting)? As a passenger in a car for an hour without a break? Lying down in the afternoon when circumstances permit? Sitting and talking to someone? Sitting quietly after lunch without alcohol? In a car, while stopped for a few minutes in traffic?   Total = 9/ 24 points   FSS endorsed at 49/ 63 points.    Social History   Socioeconomic History   Marital status: Married    Spouse name: Not on file   Number of children: 1   Years of education: Not on file   Highest education level: Not on file  Occupational History   Occupation: Disabled  Tobacco Use   Smoking status: Never   Smokeless tobacco: Never  Vaping Use   Vaping Use: Never used  Substance and Sexual Activity   Alcohol use: Yes    Comment: 08/03/2015 "glass of wine maybe twice/month"   Drug use: No   Sexual activity: Yes    Comment: hysterectomy  Other Topics Concern   Not on file  Social History Narrative   Not on file   Social Determinants of Health   Financial Resource Strain: Not on file  Food Insecurity: Not on file  Transportation Needs: Not on file  Physical Activity: Not on file  Stress: Not on file  Social Connections: Not on file    Family History  Problem Relation Age of Onset   Lung cancer Mother        smoker   Other Father        benigh tumor between heart and lungs, had surgery and something went wrong, he died at 58   Diabetes Paternal Grandmother    Heart disease Paternal Grandmother    Diabetes Paternal Grandfather    Heart disease Paternal Grandfather    Pancreatic cancer Paternal Grandfather    Liver cancer Paternal Grandfather    Breast cancer Maternal Aunt    Colon cancer Neg Hx    Esophageal cancer Neg Hx    Prostate cancer Neg Hx    Rectal cancer Neg Hx    Stomach cancer Neg Hx     Past Medical History:  Diagnosis Date   ADHD (attention deficit hyperactivity disorder)    Allergy    Anemia    Anxiety    Bipolar disorder ([place])    Chronic back pain    Chronic headaches    Chronic interstitial cystitis    "removed my bladder when I went to end stage IC"   Depression    Fibromyalgia    GERD (gastroesophageal reflux disease)    Gestational diabetes    resolved   History of blood transfusion 02/2010   "related to OR"   History of kidney stones    HLD (hyperlipidemia)     Hypothyroid    Interstitial cystitis    Self-catheterizes urinary bladder    "I have an [place] pouch; made a bladder using part of my small intestines; cath out of stoma in my belly button" (08/02/2015)   Small bowel obstruction ([place]) 08/03/2015   Thyroid disease     Past Surgical History:  Procedure Laterality Date   ABDOMINAL HYSTERECTOMY  02/2010   "left my ovaries"   APPENDECTOMY  02/2010   BLADDER REMOVAL  02/2010   and [place]  2009   CHOLECYSTECTOMY N/A 05/27/2017   Procedure: LAPAROSCOPIC CHOLECYSTECTOMY;  Surgeon: Ralene Ok,  MD;  Location: [facility];  Service: General;  Laterality: N/A;   COLONOSCOPY     indiana pouch  01/19/2021   Cystoscopy with bulk amid injection   LAPAROSCOPIC LYSIS OF ADHESIONS N/A 05/27/2017   Procedure: LAPAROSCOPIC LYSIS OF ADHESIONS;  Surgeon: Ralene Ok, MD;  Location: [facility];  Service: General;  Laterality: N/A;   OTHER SURGICAL HISTORY     remval of urethra when bladder was removed   SMALL INTESTINE SURGERY  2017   for bowel obstruction   SPINAL CORD STIMULATOR IMPLANT  09/2009; ~ 04/2015   SPINAL CORD STIMULATOR REMOVAL  ~ 04/2015   UPPER GI ENDOSCOPY     WISDOM TOOTH EXTRACTION       Current Outpatient Medications on File Prior to Visit  Medication Sig Dispense Refill   amantadine (SYMMETREL) 100 MG capsule Take 100 mg by mouth 2 (two) times daily.     amphetamine-dextroamphetamine (ADDERALL) 30 MG tablet Take 30 mg by mouth 2 (two) times daily.     clonazePAM (KLONOPIN) 1 MG tablet Take 1 mg by mouth 2 (two) times daily as needed.     colestipol (COLESTID) 1 g tablet Take 1 tablet (1 g total) by mouth 2 (two) times daily. 60 tablet 2   D3-50 1.25 MG (50000 UT) capsule Take 50,000 Units by mouth 3 (three) times a week.     dicyclomine (BENTYL) 10 MG capsule TAKE 1 CAPSULE BY MOUTH TWICE A DAY 180 capsule 1   estradiol (ESTRACE) 0.5 MG tablet TAKE ONE TABLET AS NEEDED ONCE A DAY FOR "MENOPAUSAL SYMPTOMS"      fenofibrate 160 MG tablet Take 160 mg by mouth daily.     levonorgestrel-ethinyl estradiol (SEASONALE,INTROVALE,JOLESSA) 0.15-0.03 MG tablet Take 1 tablet by mouth daily.     levothyroxine (SYNTHROID, LEVOTHROID) 137 MCG tablet Take 137 mcg by mouth daily before breakfast.      lidocaine (XYLOCAINE) 5 % ointment Apply 1 application topically daily.     lithium carbonate (ESKALITH) 450 MG CR tablet SMARTSIG:1 Tablet(s) By Mouth Every Evening     lumateperone tosylate (CAPLYTA) 42 MG capsule Take 1 capsule by mouth daily.     metoprolol succinate (TOPROL-XL) 100 MG 24 hr tablet Take 100 mg by mouth daily.     nitrofurantoin (MACRODANTIN) 50 MG capsule Take 50 mg by mouth daily.     omeprazole (PRILOSEC) 40 MG capsule TAKE 1 CAPSULE (40 MG TOTAL) BY MOUTH DAILY. 30 capsule 3   ondansetron (ZOFRAN) 4 MG tablet Take 2-4 mg by mouth every 8 (eight) hours as needed.     ondansetron (ZOFRAN-ODT) 4 MG disintegrating tablet Take 1 tablet (4 mg total) by mouth every 8 (eight) hours as needed for nausea or vomiting. 24 tablet 3   oxybutynin (DITROPAN) 5 MG/5ML syrup Take 5 mg by mouth 4 (four) times daily.     rOPINIRole (REQUIP) 0.5 MG tablet Take 0.5 mg by mouth at bedtime.     rosuvastatin (CRESTOR) 5 MG tablet Take 1 tablet by mouth daily.     telmisartan (MICARDIS) 40 MG tablet Take 40 mg by mouth daily.     tiZANidine (ZANAFLEX) 4 MG tablet Take 8 mg by mouth 2 (two) times daily as needed.     Current Facility-Administered Medications on File Prior to Visit  Medication Dose Route Frequency Provider Last Rate Last Admin   0.9 %  sodium chloride infusion  500 mL Intravenous Once Armbruster, Carlota Raspberry, MD        Allergies  Allergen Reactions   Keflex [Cephalexin] Hives   Penicillins Hives and Other (See Comments)    PATIENT HAS HAD A PCN REACTION WITH IMMEDIATE RASH, FACIAL/TONGUE/THROAT SWELLING, SOB, OR LIGHTHEADEDNESS WITH HYPOTENSION:  #  #  #  YES  #  #  #   Has patient had a PCN reaction  causing severe rash involving mucus membranes or skin necrosis: No Has patient had a PCN reaction that required hospitalization No Has patient had a PCN reaction occurring within the last 10 years: No If all of the above answers are "NO", then may proceed with Cephalosporin use.    Sulfa Antibiotics Hives    Physical exam:  Today's Vitals   02/14/21 0935  BP: 122/90  Pulse: 93  Weight: 227 lb (103 kg)  Height: 5' 6"  (1.676 m)   Body mass index is 36.64 kg/m.   Wt Readings from Last 3 Encounters:  02/14/21 227 lb (103 kg)  02/01/21 235 lb (106.6 kg)  01/25/21 235 lb 9.6 oz (106.9 kg)     Ht Readings from Last 3 Encounters:  02/14/21 5' 6"  (1.676 m)  02/01/21 5' 6"  (1.676 m)  01/25/21 5' 6"  (1.676 m)      General: The patient is awake, alert and appears not in acute distress. The patient is well groomed. Head: Normocephalic, atraumatic. Neck is supple. Mallampati 3,  neck circumference:15 inches . Nasal airflow  patent.  Retrognathia is not  seen.  Dental status: used braces.  Cardiovascular:  Regular rate and cardiac rhythm by pulse,  without distended neck veins. Respiratory: Lungs are clear to auscultation.  Skin:  Without evidence of ankle edema, or rash. Trunk: The patient's posture is erect.   Neurologic exam : The patient is awake and alert, oriented to place and time.   Memory subjective described as intact.  Attention span & concentration ability appears normal.  Speech is fluent,  without  dysarthria, dysphonia or aphasia.  Mood and affect are appropriate.   Cranial nerves: no loss of smell or taste reported  Pupils are equal and briskly reactive to light. Funduscopic exam deferred.  Extraocular movements in vertical and horizontal planes were intact and without nystagmus. No Diplopia. Visual fields by finger perimetry are intact. Hearing was intact to soft voice and finger rubbing.   Facial sensation intact to fine touch.  Facial motor strength is  symmetric and tongue and uvula move midline.  Neck ROM : rotation, tilt and flexion extension were normal for age and shoulder shrug was symmetrical.    Motor exam:  Symmetric bulk, tone and ROM.   Normal tone without cog wheeling, symmetric grip strength .   Sensory:  Fine touch, pinprick and vibration were tested  and  normal.  Proprioception tested in the upper extremities was normal.   Coordination: Rapid alternating movements in the fingers/hands were of normal speed.  The Finger-to-nose maneuver was intact without evidence of ataxia, dysmetria or tremor.   Gait and station: Patient could rise unassisted from a seated position, walked without assistive device.  Stance is of normal width/ base and the patient turned with 3 steps.  Toe and heel walk were deferred.  Deep tendon reflexes: in the  upper and lower extremities are symmetric and intact.  Babinski response was deferred .     After spending a total time of  45  minutes face to face and additional time for physical and neurologic examination, review of laboratory studies,  personal review of imaging studies, reports and  results of other testing and review of referral information / records as far as provided in visit, I have established the following assessments:  1) [name] reports difficulties with qualitative high sleep.  She is limited to about 6 hours of sleep at night because that is the capacity of her neobladder, in her case an [place] pouch.  She has a longstanding history of autoimmune diseases, chronic pain, and all this can certainly contribute a great deal to fatigue.    OSA:  Her reported snoring has been worse when she gains weight her current BMI however is 36 so she is considered morbidly obese.  She lost about 20 pounds before we met and her snoring has become more moderate according to her husband.  Her husband has never witnessed her to have apnea but she definitely has the upper airway anatomy and BMI that  would pose her at a higher risk of having obstructive sleep apnea.  She is not at this time on narcotic pain medication or any central apnea stimulating medication.    2)she has RLS and that would be evaluated in an in lab sleep study, takes Requip- Ropinorol, and this helps also iron IV has had a good effect. In addition she is anemic and she requires cues for months iron infusions.  That if she still in the work-up by GI to see where she loses blood.   3) bipolar disorder : on Caplyta , lithium, zofran and ditropan, zanaflex all have sedating effects.     My Plan is to proceed with:  1) attended sleep study with apnea and LM evaluation. 2) pan B is a HST:  3) reviewed all labs from dr Avvas office. Hematology.   I would like to thank Prince Solian, MD and Prince Solian, [address] [place],  [place] [address] for allowing me to meet with and to take care of this pleasant patient.   In short, [name] is presenting with fatigue, sleepiness, Anxiety attacks and chronic pain. I plan to follow up either personally or through our NP within 3-4  months.   CC: I will share my notes with PCP.   Electronically signed by: Larey Seat, MD 02/14/2021 9:38 AM  [facility] Board certified by The [employer] of Sleep Medicine and Diplomate of the [employer] of Sleep Medicine. Board certified In Neurology through the [place], Fellow of the [employer] of Neurology. Medical Director of [facility].

## 2021-02-23 DIAGNOSIS — I10 Essential (primary) hypertension: Secondary | ICD-10-CM | POA: Diagnosis not present

## 2021-02-23 DIAGNOSIS — R000 Tachycardia, unspecified: Secondary | ICD-10-CM | POA: Diagnosis not present

## 2021-03-01 DIAGNOSIS — R339 Retention of urine, unspecified: Secondary | ICD-10-CM | POA: Diagnosis not present

## 2021-03-06 ENCOUNTER — Other Ambulatory Visit: Payer: Self-pay | Admitting: Physician Assistant

## 2021-03-06 DIAGNOSIS — E611 Iron deficiency: Secondary | ICD-10-CM

## 2021-03-07 ENCOUNTER — Inpatient Hospital Stay: Payer: BC Managed Care – PPO | Attending: Physician Assistant

## 2021-03-07 ENCOUNTER — Other Ambulatory Visit: Payer: Self-pay

## 2021-03-07 ENCOUNTER — Inpatient Hospital Stay (HOSPITAL_BASED_OUTPATIENT_CLINIC_OR_DEPARTMENT_OTHER): Payer: BC Managed Care – PPO | Admitting: Physician Assistant

## 2021-03-07 ENCOUNTER — Telehealth: Payer: Self-pay

## 2021-03-07 VITALS — BP 127/89 | HR 98 | Temp 97.7°F | Resp 17 | Ht 66.0 in | Wt 228.1 lb

## 2021-03-07 DIAGNOSIS — D509 Iron deficiency anemia, unspecified: Secondary | ICD-10-CM | POA: Insufficient documentation

## 2021-03-07 DIAGNOSIS — E611 Iron deficiency: Secondary | ICD-10-CM | POA: Diagnosis not present

## 2021-03-07 LAB — COMPREHENSIVE METABOLIC PANEL
ALT: 18 U/L (ref 0–44)
AST: 17 U/L (ref 15–41)
Albumin: 4.7 g/dL (ref 3.5–5.0)
Alkaline Phosphatase: 49 U/L (ref 38–126)
Anion gap: 11 (ref 5–15)
BUN: 14 mg/dL (ref 6–20)
CO2: 22 mmol/L (ref 22–32)
Calcium: 10 mg/dL (ref 8.9–10.3)
Chloride: 103 mmol/L (ref 98–111)
Creatinine, Ser: 0.91 mg/dL (ref 0.44–1.00)
GFR, Estimated: >60 mL/min (ref >60)
Glucose, Bld: 118 mg/dL — ABNORMAL HIGH (ref 70–99)
Potassium: 4.5 mmol/L (ref 3.5–5.1)
Sodium: 136 mmol/L (ref 135–145)
Total Bilirubin: 0.5 mg/dL (ref 0.3–1.2)
Total Protein: 7.9 g/dL (ref 6.5–8.1)

## 2021-03-07 LAB — CBC WITH DIFFERENTIAL (CANCER CENTER ONLY)
Abs Immature Granulocytes: 0.04 10*3/uL (ref 0.00–0.07)
Basophils Absolute: 0.1 10*3/uL (ref 0.0–0.1)
Basophils Relative: 1 %
Eosinophils Absolute: 0.1 10*3/uL (ref 0.0–0.5)
Eosinophils Relative: 2 %
HCT: 46.4 % — ABNORMAL HIGH (ref 36.0–46.0)
Hemoglobin: 15.1 g/dL — ABNORMAL HIGH (ref 12.0–15.0)
Immature Granulocytes: 1 %
Lymphocytes Relative: 20 %
Lymphs Abs: 1.4 10*3/uL (ref 0.7–4.0)
MCH: 29 pg (ref 26.0–34.0)
MCHC: 32.5 g/dL (ref 30.0–36.0)
MCV: 89.2 fL (ref 80.0–100.0)
Monocytes Absolute: 0.5 10*3/uL (ref 0.1–1.0)
Monocytes Relative: 7 %
Neutro Abs: 5 10*3/uL (ref 1.7–7.7)
Neutrophils Relative %: 69 %
Platelet Count: 324 10*3/uL (ref 150–400)
RBC: 5.2 MIL/uL — ABNORMAL HIGH (ref 3.87–5.11)
RDW: 13.6 % (ref 11.5–15.5)
WBC Count: 7.1 10*3/uL (ref 4.0–10.5)
nRBC: 0 % (ref 0.0–0.2)

## 2021-03-07 LAB — IRON AND IRON BINDING CAPACITY (CC-WL,HP ONLY)
Iron: 142 ug/dL (ref 28–170)
Saturation Ratios: 26 % (ref 10.4–31.8)
TIBC: 538 ug/dL — ABNORMAL HIGH (ref 250–450)
UIBC: 396 ug/dL (ref 148–442)

## 2021-03-07 LAB — FERRITIN: Ferritin: 203 ng/mL (ref 11–307)

## 2021-03-07 NOTE — Telephone Encounter (Signed)
Pt advised with understanding. 

## 2021-03-07 NOTE — Telephone Encounter (Signed)
-----   Message from Lincoln Brigham, PA-C sent at 03/07/2021 11:44 AM EST ----- Please let patient know that iron panel shows that iron deficiency has resolved. Continue to monitor and repeat labs in 3 months as scheduled.

## 2021-03-07 NOTE — Progress Notes (Signed)
[name]  PROGRESS NOTE  Patient Care Team: Prince Solian, MD as PCP - General (Internal Medicine)  CHIEF COMPLAINTS/PURPOSE OF CONSULTATION:  Iron deficiency anemia  TREATMENT HISTORY: -Received IV feraheme x 2 doses on 05/04/2020 and 05/11/2020 -Received IV venofer x 5 doses on 01/04/2021-02/01/2021  HISTORY OF PRESENTING ILLNESS:   [name] 41 y.o. female is here because of IDA.  She was last seen on 12/30/2020. Since the last visit, she received IV venofer x 5 doses. Additionally, she underwent EGD and colonoscopy on 01/20/2021. Findings suggest that gastritis could be possible cause of iron deficiency. No evidence of active bleeding was found.   Patient reports that her energy levels are fairly stable.  Still has fatigue but continues to complete her daily activities on her own.  She reports that her restless leg improved after her 4th IV venofer Her appetite is stable without any noticeable weight changes.  Patient has intermittent episodes of nausea without vomiting episodes.  She reports persistent diarrhea that has been chronic, with 7-8 episodes per day.  Patient was recently prescribed colestipol by her gastroenterologist without improvement of GI symptoms.  She is scheduled for a follow-up at the end of this month to discuss further diagnostic work-up including a capsular endoscopy. She denies easy bruising or signs of bleeding. She denies any fevers, chills, night sweats, shortness of breath, chest pain or cough. She has no other complaints.   Rest of the pertinent 10 point ROS reviewed and negative.  REVIEW OF SYSTEMS:   Constitutional: Denies fevers, chills or abnormal night sweats Eyes: Denies blurriness of vision, double vision or watery eyes Ears, nose, mouth, throat, and face: Denies mucositis or sore throat Respiratory: Denies cough, dyspnea or wheezes Cardiovascular: Denies palpitation, chest discomfort or lower extremity  swelling Gastrointestinal:  +Chronic nausea intermittently. +Diarrhea (chronic and up to 7-8 episodes per day) Skin: Denies abnormal skin rashes Lymphatics: Denies new lymphadenopathy or easy bruising Neurological:Denies numbness, tingling or new weaknesses Behavioral/Psych: Mood is stable, no new changes   All other systems were reviewed with the patient and are negative.  MEDICAL HISTORY:  Past Medical History:  Diagnosis Date   ADHD (attention deficit hyperactivity disorder)    Allergy    Anemia    Anxiety    Bipolar disorder ([place])    Chronic back pain    Chronic headaches    Chronic interstitial cystitis    "removed my bladder when I went to end stage IC"   Depression    Fibromyalgia    GERD (gastroesophageal reflux disease)    Gestational diabetes    resolved   History of blood transfusion 02/2010   "related to OR"   History of kidney stones    HLD (hyperlipidemia)    Hypothyroid    Interstitial cystitis    Self-catheterizes urinary bladder    "I have an [place] pouch; made a bladder using part of my small intestines; cath out of stoma in my belly button" (08/02/2015)   Small bowel obstruction ([place]) 08/03/2015   Thyroid disease     SURGICAL HISTORY: Past Surgical History:  Procedure Laterality Date   ABDOMINAL HYSTERECTOMY  02/2010   "left my ovaries"   APPENDECTOMY  02/2010   BLADDER REMOVAL  02/2010   and [place]  2009   CHOLECYSTECTOMY N/A 05/27/2017   Procedure: LAPAROSCOPIC CHOLECYSTECTOMY;  Surgeon: Ralene Ok, MD;  Location: [facility];  Service: General;  Laterality: N/A;   COLONOSCOPY  [place] pouch  01/19/2021   Cystoscopy with bulk amid injection   LAPAROSCOPIC LYSIS OF ADHESIONS N/A 05/27/2017   Procedure: LAPAROSCOPIC LYSIS OF ADHESIONS;  Surgeon: Ralene Ok, MD;  Location: [facility];  Service: General;  Laterality: N/A;   OTHER SURGICAL HISTORY     remval of urethra when bladder was removed   SMALL INTESTINE  SURGERY  2017   for bowel obstruction   SPINAL CORD STIMULATOR IMPLANT  09/2009; ~ 04/2015   SPINAL CORD STIMULATOR REMOVAL  ~ 04/2015   UPPER GI ENDOSCOPY     WISDOM TOOTH EXTRACTION      SOCIAL HISTORY: Social History   Socioeconomic History   Marital status: Married    Spouse name: Not on file   Number of children: 1   Years of education: Not on file   Highest education level: Not on file  Occupational History   Occupation: Disabled  Tobacco Use   Smoking status: Never   Smokeless tobacco: Never  Vaping Use   Vaping Use: Never used  Substance and Sexual Activity   Alcohol use: Yes    Comment: 08/03/2015 "glass of wine maybe twice/month"   Drug use: No   Sexual activity: Yes    Comment: hysterectomy  Other Topics Concern   Not on file  Social History Narrative   Not on file   Social Determinants of Health   Financial Resource Strain: Not on file  Food Insecurity: Not on file  Transportation Needs: Not on file  Physical Activity: Not on file  Stress: Not on file  Social Connections: Not on file  Intimate Partner Violence: Not on file    FAMILY HISTORY: Family History  Problem Relation Age of Onset   Lung cancer Mother        smoker   Other Father        benigh tumor between heart and lungs, had surgery and something went wrong, he died at 43   Diabetes Paternal Grandmother    Heart disease Paternal Grandmother    Diabetes Paternal Grandfather    Heart disease Paternal Grandfather    Pancreatic cancer Paternal Grandfather    Liver cancer Paternal Grandfather    Breast cancer Maternal Aunt    Colon cancer Neg Hx    Esophageal cancer Neg Hx    Prostate cancer Neg Hx    Rectal cancer Neg Hx    Stomach cancer Neg Hx     ALLERGIES:  is allergic to keflex [cephalexin], penicillins, and sulfa antibiotics.  MEDICATIONS:  Current Outpatient Medications  Medication Sig Dispense Refill   amantadine (SYMMETREL) 100 MG capsule Take 100 mg by mouth 2 (two) times  daily.     amphetamine-dextroamphetamine (ADDERALL) 30 MG tablet Take 30 mg by mouth 2 (two) times daily.     clonazePAM (KLONOPIN) 1 MG tablet Take 1 mg by mouth 2 (two) times daily as needed.     colestipol (COLESTID) 1 g tablet Take 1 tablet (1 g total) by mouth 2 (two) times daily. 60 tablet 2   D3-50 1.25 MG (50000 UT) capsule Take 50,000 Units by mouth 3 (three) times a week.     dicyclomine (BENTYL) 10 MG capsule TAKE 1 CAPSULE BY MOUTH TWICE A DAY 180 capsule 1   estradiol (ESTRACE) 0.5 MG tablet TAKE ONE TABLET AS NEEDED ONCE A DAY FOR "MENOPAUSAL SYMPTOMS"     fenofibrate 160 MG tablet Take 160 mg by mouth daily.     levonorgestrel-ethinyl estradiol (SEASONALE,INTROVALE,JOLESSA) 0.15-0.03 MG tablet  Take 1 tablet by mouth daily.     levothyroxine (SYNTHROID, LEVOTHROID) 137 MCG tablet Take 137 mcg by mouth daily before breakfast.      lidocaine (XYLOCAINE) 5 % ointment Apply 1 application topically daily.     lithium carbonate (ESKALITH) 450 MG CR tablet SMARTSIG:1 Tablet(s) By Mouth Every Evening     lumateperone tosylate (CAPLYTA) 42 MG capsule Take 1 capsule by mouth daily.     metoprolol succinate (TOPROL-XL) 100 MG 24 hr tablet Take 100 mg by mouth daily.     nitrofurantoin (MACRODANTIN) 50 MG capsule Take 50 mg by mouth daily.     omeprazole (PRILOSEC) 40 MG capsule TAKE 1 CAPSULE (40 MG TOTAL) BY MOUTH DAILY. 30 capsule 3   ondansetron (ZOFRAN) 4 MG tablet Take 2-4 mg by mouth every 8 (eight) hours as needed.     ondansetron (ZOFRAN-ODT) 4 MG disintegrating tablet Take 1 tablet (4 mg total) by mouth every 8 (eight) hours as needed for nausea or vomiting. 24 tablet 3   oxybutynin (DITROPAN) 5 MG/5ML syrup 5 mg 2 (two) times daily as needed. Per [place] pouch     rOPINIRole (REQUIP) 0.5 MG tablet Take 0.5 mg by mouth at bedtime.     rosuvastatin (CRESTOR) 5 MG tablet Take 1 tablet by mouth daily.     telmisartan (MICARDIS) 40 MG tablet Take 40 mg by mouth daily.     tiZANidine  (ZANAFLEX) 4 MG tablet Take 8 mg by mouth 2 (two) times daily as needed.     oxybutynin (DITROPAN) 5 MG/5ML syrup Take 5 mg by mouth 4 (four) times daily.     Current Facility-Administered Medications  Medication Dose Route Frequency Provider Last Rate Last Admin   0.9 %  sodium chloride infusion  500 mL Intravenous Once Armbruster, Carlota Raspberry, MD         PHYSICAL EXAMINATION:  ECOG PERFORMANCE STATUS: 0 - Asymptomatic  Vitals:   03/07/21 1035  BP: 127/89  Pulse: 98  Resp: 17  Temp: 97.7 F (36.5 C)  SpO2: 100%   Filed Weights   03/07/21 1035  Weight: 228 lb 1.6 oz (103.5 kg)    GENERAL:alert, no distress and comfortable SKIN:, .  skin color, texture, turgor are normal, no rashes or significant lesions EYES: normal, conjunctiva are pink and non-injected, sclera clear OROPHARYNX:no exudate, no erythema and lips, buccal mucosa, and tongue normal  LUNGS: clear to auscultation and percussion with normal breathing effort HEART: regular rate & rhythm and no murmurs and no lower extremity edema ABDOMEN:abdomen soft, non-tender and normal bowel sounds.  Some tenderness diffusely in the abdomen.  Stoma noted in the lower abdomen, appears healthy Musculoskeletal:no cyanosis of digits and no clubbing  PSYCH: alert & oriented x 3 with fluent speech NEURO: no focal motor/sensory deficits  LABORATORY DATA:  I have reviewed the data as listed Lab Results  Component Value Date   WBC 7.1 03/07/2021   HGB 15.1 (H) 03/07/2021   HCT 46.4 (H) 03/07/2021   MCV 89.2 03/07/2021   PLT 324 03/07/2021     Chemistry      Component Value Date/Time   NA 136 03/07/2021 1010   K 4.5 03/07/2021 1010   CL 103 03/07/2021 1010   CO2 22 03/07/2021 1010   BUN 14 03/07/2021 1010   CREATININE 0.91 03/07/2021 1010      Component Value Date/Time   CALCIUM 10.0 03/07/2021 1010   ALKPHOS 49 03/07/2021 1010   AST 17 03/07/2021 1010  ALT 18 03/07/2021 1010   BILITOT 0.5 03/07/2021 1010       RADIOGRAPHIC STUDIES: I have personally reviewed the radiological images as listed and agreed with the findings in the report. No results found.   ASSESSMENT & PLAN:  [name] is a 41 y.o. female who returns for follow-up for iron deficiency anemia.    #Iron deficiency anemia: --Unable to tolerate oral iron due to GI intolerance --Underwent EGD/colonoscopy on 01/20/2021. Findings suggest that gastritis could be the underlying cause of iron deficiency.  --Under the care of Dr. Havery Moros in GI, next appt on 04/04/2021.  --Labs today show that iron deficiency has resolved. Serum iron 142, saturation 26%, ferritin 203. --RTC with labs only in 3 months and follow up visit/labs in 6 months.    All questions were answered. The patient knows to call the clinic with any problems, questions or concerns.  I have spent a total of 25 minutes minutes of face-to-face and non-face-to-face time, preparing to see the patient,  performing a medically appropriate examination, counseling and educating the patient,  documenting clinical information in the electronic health record, and care coordination.    Lincoln Brigham, PA-C Hematology and [place] at [facility]

## 2021-03-10 ENCOUNTER — Encounter: Payer: Self-pay | Admitting: Gastroenterology

## 2021-03-13 NOTE — Telephone Encounter (Signed)
Pt already has an appt on 04/04/21 at 10:10 am. Pt advised to keep appt that is scheduled.

## 2021-03-21 DIAGNOSIS — F3132 Bipolar disorder, current episode depressed, moderate: Secondary | ICD-10-CM | POA: Diagnosis not present

## 2021-03-21 DIAGNOSIS — F4310 Post-traumatic stress disorder, unspecified: Secondary | ICD-10-CM | POA: Diagnosis not present

## 2021-03-21 DIAGNOSIS — F900 Attention-deficit hyperactivity disorder, predominantly inattentive type: Secondary | ICD-10-CM | POA: Diagnosis not present

## 2021-03-27 ENCOUNTER — Other Ambulatory Visit: Payer: Self-pay

## 2021-03-27 ENCOUNTER — Ambulatory Visit (INDEPENDENT_AMBULATORY_CARE_PROVIDER_SITE_OTHER): Payer: BC Managed Care – PPO | Admitting: Neurology

## 2021-03-27 DIAGNOSIS — G478 Other sleep disorders: Secondary | ICD-10-CM

## 2021-03-27 DIAGNOSIS — I10 Essential (primary) hypertension: Secondary | ICD-10-CM | POA: Diagnosis not present

## 2021-03-27 DIAGNOSIS — R0683 Snoring: Secondary | ICD-10-CM | POA: Diagnosis not present

## 2021-03-27 DIAGNOSIS — G9332 Myalgic encephalomyelitis/chronic fatigue syndrome: Secondary | ICD-10-CM

## 2021-03-27 DIAGNOSIS — M359 Systemic involvement of connective tissue, unspecified: Secondary | ICD-10-CM

## 2021-03-27 DIAGNOSIS — G2581 Restless legs syndrome: Secondary | ICD-10-CM

## 2021-03-27 DIAGNOSIS — E1169 Type 2 diabetes mellitus with other specified complication: Secondary | ICD-10-CM | POA: Diagnosis not present

## 2021-03-27 DIAGNOSIS — D500 Iron deficiency anemia secondary to blood loss (chronic): Secondary | ICD-10-CM

## 2021-03-28 ENCOUNTER — Telehealth: Payer: Self-pay

## 2021-03-28 NOTE — Telephone Encounter (Signed)
-----   Message from Yevette Edwards, RN sent at 01/31/2021  8:02 AM EST ----- Regarding: Labs CBC, IBC + Ferritin - the order is in epic

## 2021-03-28 NOTE — Telephone Encounter (Signed)
Pt is due for repeat labs. Pt had labs drawn on 03/07/21 to include CBC and Ferritin. Results are available in epic. Would you still like for patient to repeat labs at this time?

## 2021-03-29 NOTE — Telephone Encounter (Signed)
Noted, thank you

## 2021-03-29 NOTE — Telephone Encounter (Signed)
Her labs look good, nothing to repeat at this time. She is scheduled to see me next week in the office and will discuss further lab draws with her at that time. Thanks

## 2021-03-29 NOTE — Procedures (Signed)
PATIENT'S NAME:  [name] DOB:      1980/06/01      MR#:    [id number]     DATE OF RECORDING: 03/27/2021 REFERRING M.D.:  Prince Solian, MD Study Performed:   Baseline Polysomnogram HISTORY:  02-14-2021 sleep consultation: [facility] : " I have iron infusions every 3 months, intestinal bleeding source is suspected, GI work up ongoing, have interstitial cystitis, lost my bladder and had an [place]- pouch, lost urethra, appendix, uterus, and urinary bladder ( 12/23/ 2011) , cholecystectomy in 2019. Nocturia/- one, at 5 AM , Sleep walking in childhood- Night terrors- post traumatic. Interstim patient from 2010 -2019-   She has a past medical history of ADHD (attention deficit hyperactivity disorder), Allergy, Anemia, Anxiety, Bipolar disorder ([place]), Chronic back pain, Chronic headaches, Chronic interstitial cystitis, Depression, Fibromyalgia- Dr Estanislado Pandy, GERD (gastroesophageal reflux disease), Gestational diabetes, History of blood transfusion (02/2010), History of kidney stones, HLD (hyperlipidemia), Hypothyroid, Interstitial cystitis, Self-catheterizes urinary bladder, Small bowel obstruction ([place]) (08/03/2015), and Thyroid disease. The patient endorsed the Epworth Sleepiness Scale at 9 points.   The patient's weight 227 pounds with a height of 66 (inches), resulting in a BMI of 36.5 kg/m2. The patient's neck circumference measured 15 inches.  CURRENT MEDICATIONS: Symmetrel, Adderall, Klonopin, Colestid, D3, Bentyl, Estrace, Fenofibrate, Synthroid, Xylocaine, Eskalith, Caplyta, Toprol-XL, Macrodontin, Prilosec, Zofran, Ditropan, Requip, Crestor, Micardis, Zanaflex   PROCEDURE:  This is a multichannel digital polysomnogram utilizing the Somnostar 11.2 system.  Electrodes and sensors were applied and monitored per AASM Specifications.   EEG, EOG, Chin and Limb EMG, were sampled at 200 Hz.  ECG, Snore and Nasal Pressure, Thermal Airflow, Respiratory Effort, CPAP Flow and Pressure, Oximetry  was sampled at 50 Hz. Digital video and audio were recorded.      BASELINE STUDY Lights Out was at 20:42 and Lights On at 05:04.  Total recording time (TRT) was 500.5 minutes, with a total sleep time (TST) of 381 minutes.    The patient's sleep latency was 65 minutes.   REM latency was 302.5 minutes. The sleep efficiency was 76.1 %.     SLEEP ARCHITECTURE: WASO (Wake after sleep onset) was 71 minutes.  There were 28.5 minutes in Stage N1, 242.5 minutes Stage N2, 73.5 minutes Stage N3 and 36.5 minutes in Stage REM.  The percentage of Stage N1 was 7.5%, Stage N2 was 63.6%, Stage N3 was 19.3% and Stage R (REM sleep) was 9.6%.   RESPIRATORY ANALYSIS:  There were a total of 1 respiratory events:  0 obstructive apneas, 0 central apneas and 0 mixed apneas with a total of 0 apneas and an apnea index (AI) of 0 /hour. There were 1 hypopneas with a hypopnea index of .2 /hour.   The total APNEA/HYPOPNEA INDEX (AHI) was .2/hour.  1 events occurred in REM sleep and 0 events in NREM. The REM AHI was 1.6 /hour, versus a non-REM AHI of 0. The patient spent 0 minutes of total sleep time in the supine position and 381 minutes in non-supine. The supine AHI was 0.0 versus a non-supine AHI of 0.2.  OXYGEN SATURATION & C02:  The Wake baseline 02 saturation was 95%, with the lowest being 91%. Time spent below 89% saturation equaled 0 minutes. The arousals were noted as: 58 were spontaneous, 0 were associated with PLMs, 0 were associated with respiratory events.  PERIODIC LIMB MOVEMENTS:  The patient had a total of 0 Periodic Limb Movements.   Audio and video analysis did not show any  abnormal or unusual movements, behaviors, phonations or vocalizations.  Sleep was very fragmented.  The patient became diaphoretic, her EKG was irregular, see screenshot attached.  Mild Snoring was noted.  IMPRESSION:  Neither sleep apnea nor PLMD were present. If this patient has RLS, it is not translated into PLMs. Abnormal EKG was  noted, and diaphoresis at the same time-  Overall poor sleep efficiency, prolonged latency and delayed REM sleep.  Mild snoring in supine- not arousal causing.    RECOMMENDATIONS:  I do not have any recommendations, there was no primary organic sleep disorder identified.  Chronic pain is the most likely cause of sleep fragmentation Please inform Dr Dagmar Hait of the abnormal EKG .   I certify that I have reviewed the entire raw data recording prior to the issuance of this report in accordance with the Standards of Accreditation of the American Academy of Sleep Medicine (AAS    Larey Seat, MD Diplomat, American Board of Neurology  Diplomat, Tax adviser of Sleep Medicine Market researcher, [employer] [facility] at [employer]

## 2021-03-29 NOTE — Progress Notes (Signed)
The arousals were noted as: 58 were spontaneous, 0 were associated with PLMs, 0 were associated with respiratory events.        Sleep was very fragmented.  The patient became diaphoretic, her EKG was irregular, see screenshot attached.  Mild Snoring was noted.  IMPRESSION:  1. Neither sleep apnea nor PLMD were present. If this patient has RLS, it is not translated into PLMs. 2. Abnormal EKG was noted, and diaphoresis at the same time-  3. Overall poor sleep efficiency, prolonged latency and delayed REM sleep.  4. Mild snoring in supine- not arousal causing.    RECOMMENDATIONS:  I do not have any recommendations, there was no primary organic sleep disorder identified.  Chronic pain is the most likely cause of sleep fragmentation Please inform Dr Dagmar Hait of the abnormal EKG . No follow up in the sleep clinic is needed.

## 2021-03-30 ENCOUNTER — Telehealth: Payer: Self-pay | Admitting: Neurology

## 2021-03-30 NOTE — Telephone Encounter (Signed)
Called the pt and was able to review the sleep study results. Advised that the study overall showed no organic sleep disorder. She did note ekg was irregular asked that the report go to PCP for the pt. Pt verbalized understanding. Pt had no questions at this time but was encouraged to call back if questions arise.

## 2021-03-30 NOTE — Telephone Encounter (Signed)
-----   Message from Larey Seat, MD sent at 03/29/2021  8:42 PM EST ----- The arousals were noted as: 58 were spontaneous, 0 were associated with PLMs, 0 were associated with respiratory events.        Sleep was very fragmented.  The patient became diaphoretic, her EKG was irregular, see screenshot attached.  Mild Snoring was noted.  IMPRESSION:  1. Neither sleep apnea nor PLMD were present. If this patient has RLS, it is not translated into PLMs. 2. Abnormal EKG was noted, and diaphoresis at the same time-  3. Overall poor sleep efficiency, prolonged latency and delayed REM sleep.  4. Mild snoring in supine- not arousal causing.    RECOMMENDATIONS:  I do not have any recommendations, there was no primary organic sleep disorder identified.  Chronic pain is the most likely cause of sleep fragmentation Please inform Dr Dagmar Hait of the abnormal EKG . No follow up in the sleep clinic is needed.

## 2021-04-04 ENCOUNTER — Encounter: Payer: Self-pay | Admitting: Gastroenterology

## 2021-04-04 ENCOUNTER — Ambulatory Visit (INDEPENDENT_AMBULATORY_CARE_PROVIDER_SITE_OTHER): Payer: BC Managed Care – PPO | Admitting: Gastroenterology

## 2021-04-04 VITALS — BP 120/64 | HR 86 | Ht 66.0 in | Wt 222.0 lb

## 2021-04-04 DIAGNOSIS — K529 Noninfective gastroenteritis and colitis, unspecified: Secondary | ICD-10-CM | POA: Diagnosis not present

## 2021-04-04 DIAGNOSIS — R109 Unspecified abdominal pain: Secondary | ICD-10-CM

## 2021-04-04 DIAGNOSIS — D509 Iron deficiency anemia, unspecified: Secondary | ICD-10-CM

## 2021-04-04 MED ORDER — DIPHENOXYLATE-ATROPINE 2.5-0.025 MG PO TABS: 1.0000 | ORAL_TABLET | Freq: Four times a day (QID) | ORAL | 1 refills | Status: DC | PRN

## 2021-04-04 NOTE — Patient Instructions (Signed)
If you are age 42 or older, your body mass index should be between 23-30. Your Body mass index is 35.83 kg/m. If this is out of the aforementioned range listed, please consider follow up with your Primary Care Provider.  If you are age 73 or younger, your body mass index should be between 19-25. Your Body mass index is 35.83 kg/m. If this is out of the aformentioned range listed, please consider follow up with your Primary Care Provider.   ________________________________________________________  The [place] [facility] providers would like to encourage you to use [facility] to communicate with providers for non-urgent requests or questions.  Due to long hold times on the telephone, sending your provider a message by [facility] may be a faster and more efficient way to get a response.  Please allow 48 business hours for a response.  Please remember that this is for non-urgent requests.  _______________________________________________________  [name] have been scheduled for an MR Enterography Abdomen and Pelvis at [facility] on _____________________________. Your appointment time is __________________.  Please arrive to admitting (at main entrance of the hospital) 2 hours prior to your appointment time for registration purposes. Please make certain not to have anything to eat or drink 6 hours prior to your test. In addition, if you have any metal in your body, have a pacemaker or defibrillator, please be sure to let your ordering physician know. Should you need to reschedule, please call [phone] to do so.  We have sent the following medications to your pharmacy for you to pick up at your convenience: Lomotil: every 6 hours as needed  Thank you for entrusting me with your care and for choosing [facility], Dr. Rockland Cellar

## 2021-04-04 NOTE — Progress Notes (Signed)
HPI :  41 year old female here for a follow-up visit for chronic diarrhea and IDA. Recall she saw me for history of iron deficiency anemia for which she went an EGD and colonoscopy with me in 2019 as outlined below without clear cause.  She has a complicated medical history to include bipolar disorder, chronic abdominal pain, pelvic pain, fibromyalgia, hx of IC s/p total cystectomy with [place] pouch. She is s/p hysterectomy, bowel resection when she had a complication from her [place] pouch.. She was on chronic narcotics for several years for chronic pain, has had bilateral celiac plexus blocks in June 2016 and had a neurostimulator in place, which has since been removed.   Recall she had eventually come off chronic narcotics in August 2021.  Around that time is when her bowels changes she started having loose stools.  She has had persistent loose stools since that time.  States she averages anywhere from 7-10 loose stools per day, no blood in the stool but with significant urgency.  No nocturnal symptoms.  She has had abdominal cramping with this.  Recall she had a work-up with stool testing which showed normal fecal elastase, negative fecal calprotectin, C. difficile negative, negative celiac serologies, had prior breath test that was indeterminate for SIBO.  Rifaximin was ordered but not covered, she took a trial of Flagyl for a few weeks which did not help at all.  She has had a trial of cholestyramine for 2 months related for post cholecystectomy state which did not help at all.  She actually had a trial of Colestid as well which did not work.  Otherwise she is status post hysterectomy, does not have any menses.  She has required multiple doses of IV iron infusion this year although she has no anemia and her iron stores more recently looked okay.    Since her last visit with me we repeated an EGD and colonoscopy in light of her ongoing symptoms and recurrent IDA.  Overall she had some mild  gastritis.  She was started on omeprazole 40 mg daily and that has helped some of her reflux and abdominal symptoms.  Small bowel biopsies negative, testing for H. pylori negative.  Colonoscopy was performed which showed no overt inflammation and biopsies negative for microscopic colitis.  1 small adenoma removed.  After her procedures I had discussed using regularly scheduled Imodium for loose stools.  She does state that if she takes upwards of 5 pills it can help slow her down for a few hours at a time but does not resolve it.  She has been taking this when she needs to leave the house etc.  This is helped her more than anything else so far although again has not resolved it.  We discussed other options as below.  She is taking Bentyl which can help sometimes as well for her abdominal discomfort.   Prior workup: EGD 01/15/2017 - LA grade A esophagitis, inflammatory nodule at GEJ, normal esophagus otherwise, normal stomach, normal small bowel.    Colonoscopy January 2019 showed the following:   - Patent end-to-end ileo-colonic anastomosis, characterized by healthy appearing mucosa. - The examined portion of the ileum was normal. - One 3 mm polyp in the transverse colon, removed with a cold biopsy forceps. Resected and retrieved. - The examination was otherwise normal on direct and retroflexion views.     Breath test indeterminate TTG negative Fecal elastase Fecal calprotectin 43 C diff negative  EGD 01/20/21 -  - LA Grade A  esophagitis was found 40 cm from the incisors. There was some nodular tissue at the GEJ in this area, suspect inflammatory changes. Biopsies were taken with a cold forceps for histology. - The exam of the esophagus was otherwise normal. - Patchy mild inflammation characterized by adherent blood and friability was found in the gastric antrum. - The exam of the stomach was otherwise normal. - Biopsies were taken with a cold forceps in the gastric body, at the  incisura and in the gastric antrum for Helicobacter pylori testing. - The duodenal bulb and second portion of the duodenum were normal. Biopsies for histology were taken with a cold forceps for evaluation of celiac disease.  Start omeprazole 40mg  once daily for 6 weeks  Colonoscopy 01/20/21 - The perianal and digital rectal examinations were normal. - The terminal ileum appeared normal. - There was evidence of a prior end-to-side ileo-colonic anastomosis in the ascending colon. This was patent and was characterized by healthy appearing mucosa. - A few medium-mouthed diverticula were found in the right colon. - A 3 mm polyp was found in the transverse colon. The polyp was sessile. The polyp was removed with a cold biopsy forceps. Resection and retrieval were complete. I thought a photo of it was taken, but it was not. It was benign appearing. - Internal hemorrhoids were found during retroflexion. - The exam was otherwise without abnormality. - Biopsies for histology were taken with a cold forceps from the right colon, left colon and transverse colon for evaluation of microscopic colitis.  1. Surgical [P], duodenum - PEPTIC DUODENITIS - NO DYSPLASIA OR MALIGNANCY IDENTIFIED 2. Surgical [P], gastric antrum and gastric body - BENIGN GASTRIC MUCOSA WITH MILD REACTIVE CHANGES - NO H. PYLORI, INTESTINAL METAPLASIA OR MALIGNANCY IDENTIFIED 3. Surgical [P], GE junction, polyp (1) - HYPERPLASTIC GASTRIC POLYP(S) - NO H. PYLORI, INTESTINAL METAPLASIA OR MALIGNANCY IDENTIFIED 4. Surgical [P], random colon sites - BENIGN COLONIC MUCOSA - NO ACTIVE INFLAMMATION OR EVIDENCE OF MICROSCOPIC COLITIS - NO HIGH-GRADE DYSPLASIA OR MALIGNANCY IDENTIFIED 5. Surgical [P], colon, transverse, polyp (1) - TUBULAR ADENOMA (1 OF 1 FRAGMENTS) - NO HIGH-GRADE DYSPLASIA OR MALIGNANCY IDENTIFIED    Past Medical History:  Diagnosis Date   ADHD (attention deficit hyperactivity disorder)    Allergy    Anemia     Anxiety    Bipolar disorder (HCC)    Chronic back pain    Chronic headaches    Chronic interstitial cystitis    "removed my bladder when I went to end stage IC"   Depression    Fibromyalgia    GERD (gastroesophageal reflux disease)    Gestational diabetes    resolved   History of blood transfusion 02/2010   "related to OR"   History of kidney stones    HLD (hyperlipidemia)    Hypothyroid    Interstitial cystitis    Self-catheterizes urinary bladder    "I have an [place] pouch; made a bladder using part of my small intestines; cath out of stoma in my belly button" (08/02/2015)   Small bowel obstruction ([place]) 08/03/2015   Thyroid disease      Past Surgical History:  Procedure Laterality Date   ABDOMINAL HYSTERECTOMY  02/2010   "left my ovaries"   APPENDECTOMY  02/2010   BLADDER REMOVAL  02/2010   and [place]  2009   CHOLECYSTECTOMY N/A 05/27/2017   Procedure: LAPAROSCOPIC CHOLECYSTECTOMY;  Surgeon: Ralene Ok, MD;  Location: [facility];  Service: General;  Laterality: N/A;  COLONOSCOPY     Linda[place] pouch  01/19/2021   Cystoscopy with bulk amid injection   LAPAROSCOPIC LYSIS OF ADHESIONS N/A 05/27/2017   Procedure: LAPAROSCOPIC LYSIS OF ADHESIONS;  Surgeon: Ralene Ok, MD;  Location: [facility];  Service: General;  Laterality: N/A;   OTHER SURGICAL HISTORY     remval of urethra when bladder was removed   SMALL INTESTINE SURGERY  2017   for bowel obstruction   SPINAL CORD STIMULATOR IMPLANT  09/2009; ~ 04/2015   SPINAL CORD STIMULATOR REMOVAL  ~ 04/2015   UPPER GI ENDOSCOPY     WISDOM TOOTH EXTRACTION     Family History  Problem Relation Age of Onset   Lung cancer Mother        smoker   Other Father        benigh tumor between heart and lungs, had surgery and something went wrong, he died at 81   Diabetes Paternal Grandmother    Heart disease Paternal Grandmother    Diabetes Paternal Grandfather    Heart disease Paternal Grandfather     Pancreatic cancer Paternal Grandfather    Liver cancer Paternal Grandfather    Breast cancer Maternal Aunt    Colon cancer Neg Hx    Esophageal cancer Neg Hx    Prostate cancer Neg Hx    Rectal cancer Neg Hx    Stomach cancer Neg Hx    Social History   Tobacco Use   Smoking status: Never   Smokeless tobacco: Never  Vaping Use   Vaping Use: Never used  Substance Use Topics   Alcohol use: Yes    Comment: 08/03/2015 "glass of wine maybe twice/month"   Drug use: No   Current Outpatient Medications  Medication Sig Dispense Refill   amantadine (SYMMETREL) 100 MG capsule Take 100 mg by mouth 2 (two) times daily.     amphetamine-dextroamphetamine (ADDERALL) 30 MG tablet Take 30 mg by mouth 2 (two) times daily.     clonazePAM (KLONOPIN) 1 MG tablet Take 1 mg by mouth 2 (two) times daily as needed.     D3-50 1.25 MG (50000 UT) capsule Take 50,000 Units by mouth 3 (three) times a week.     dicyclomine (BENTYL) 10 MG capsule TAKE 1 CAPSULE BY MOUTH TWICE A DAY 180 capsule 1   estradiol (ESTRACE) 0.5 MG tablet TAKE ONE TABLET AS NEEDED ONCE A DAY FOR "MENOPAUSAL SYMPTOMS"     fenofibrate 160 MG tablet Take 160 mg by mouth daily.     lamoTRIgine (LAMICTAL) 25 MG tablet Take 4 tablets by mouth at bedtime.     levonorgestrel-ethinyl estradiol (SEASONALE,INTROVALE,JOLESSA) 0.15-0.03 MG tablet Take 1 tablet by mouth daily.     levothyroxine (SYNTHROID, LEVOTHROID) 137 MCG tablet Take 137 mcg by mouth daily before breakfast.      lidocaine (XYLOCAINE) 5 % ointment Apply 1 application topically daily.     lumateperone tosylate (CAPLYTA) 42 MG capsule Take 1 capsule by mouth daily.     metoprolol succinate (TOPROL-XL) 100 MG 24 hr tablet Take 100 mg by mouth daily.     nitrofurantoin (MACRODANTIN) 50 MG capsule Take 50 mg by mouth daily.     omeprazole (PRILOSEC) 40 MG capsule TAKE 1 CAPSULE (40 MG TOTAL) BY MOUTH DAILY. 30 capsule 3   ondansetron (ZOFRAN) 4 MG tablet Take 2-4 mg by mouth every 8  (eight) hours as needed.     oxybutynin (DITROPAN) 5 MG/5ML syrup 5 mg 2 (two) times daily as needed. Per [place]  pouch     rOPINIRole (REQUIP) 0.5 MG tablet Take 0.5 mg by mouth at bedtime.     rosuvastatin (CRESTOR) 5 MG tablet Take 1 tablet by mouth daily.     telmisartan (MICARDIS) 40 MG tablet Take 40 mg by mouth daily.     tiZANidine (ZANAFLEX) 4 MG tablet Take 8 mg by mouth 2 (two) times daily as needed.     No current facility-administered medications for this visit.   Allergies  Allergen Reactions   Keflex [Cephalexin] Hives   Penicillins Hives and Other (See Comments)    PATIENT HAS HAD A PCN REACTION WITH IMMEDIATE RASH, FACIAL/TONGUE/THROAT SWELLING, SOB, OR LIGHTHEADEDNESS WITH HYPOTENSION:  #  #  #  YES  #  #  #   Has patient had a PCN reaction causing severe rash involving mucus membranes or skin necrosis: No Has patient had a PCN reaction that required hospitalization No Has patient had a PCN reaction occurring within the last 10 years: No If all of the above answers are "NO", then may proceed with Cephalosporin use.    Sulfa Antibiotics Hives     Review of Systems: All systems reviewed and negative except where noted in HPI.   Lab Results  Component Value Date   WBC 7.1 03/07/2021   HGB 15.1 (H) 03/07/2021   HCT 46.4 (H) 03/07/2021   MCV 89.2 03/07/2021   PLT 324 03/07/2021    Lab Results  Component Value Date   CREATININE 0.91 03/07/2021   BUN 14 03/07/2021   NA 136 03/07/2021   K 4.5 03/07/2021   CL 103 03/07/2021   CO2 22 03/07/2021    Lab Results  Component Value Date   ALT 18 03/07/2021   AST 17 03/07/2021   ALKPHOS 49 03/07/2021   BILITOT 0.5 03/07/2021    Lab Results  Component Value Date   IRON 142 03/07/2021   TIBC 538 (H) 03/07/2021   FERRITIN 203 03/07/2021     Physical Exam: BP 120/64    Pulse 86    Ht 5\' 6"  (1.676 m)    Wt 222 lb (100.7 kg)    BMI 35.83 kg/m  Constitutional: Pleasant,well-developed, female in no acute  distress. Neurological: Alert and oriented to person place and time. Skin: Skin is warm and dry. No rashes noted. Psychiatric: Normal mood and affect. Behavior is normal.   ASSESSMENT AND PLAN: 41 year old female here for reassessment of the following:  Chronic diarrhea Iron deficiency anemia Abdominal pain  As above, she has gone through an extensive work-up for her symptoms of loose stools which largely persist despite variety of regimens she has been treated with so far.  Also she has had complete hysterectomy but recurrent IDA, with negative EGD and colonoscopy.  She has responded to IV iron.  She may have a functional disorder driving her loose stools but given her recurrent iron deficiency and her symptoms I do think further evaluation of her small intestine is warranted.  We discussed capsule endoscopy versus an enterography study.  She does have a significant surgical history but no true overt obstruction of her bowel in the past.  Following discussion of each of these options, her preference is to proceed with MR enterography to evaluate her small bowel, this will also evaluate the rest of her abdomen in light of her ongoing abdominal discomfort as her last cross-sectional imaging was about 4 years ago.  While we await that result I will switch her Imodium to Lomotil and she  can take that every 6 hours as needed to see if that will help.  She can continue Bentyl.  In discussion of other options, Elavil potentially could help her pain and diarrhea but she is on a complex regimen for her bipolar and this could interact, we will hold off on that for now.  We will see if we can get her a sample of rifaximin to give that to her empirically although I am not confident how much this will help her given her lack of response to Flagyl.  Other option to consider would be Alosetron and we discussed risks of that.  Would prefer to have her enterography study back prior to considering that option.  She  agrees  Plan: - MR enterography  - Lomotil every 6 hours PRN - Continue Bentyl - consider Rifaximin sample if / when available - consider Alosetron pending her course if MRE normal and symptoms persist despite regimen as outlined, given her failure of alternatives to date  Jolly Mango, MD [facility]

## 2021-04-05 ENCOUNTER — Telehealth: Payer: Self-pay

## 2021-04-05 DIAGNOSIS — K580 Irritable bowel syndrome with diarrhea: Secondary | ICD-10-CM

## 2021-04-05 MED ORDER — RIFAXIMIN 550 MG PO TABS: 550.0000 mg | ORAL_TABLET | Freq: Three times a day (TID) | ORAL | 0 refills | Status: DC

## 2021-04-05 NOTE — Telephone Encounter (Signed)
-----   Message from Yetta Flock, MD sent at 04/05/2021 10:13 AM EST ----- Regarding: RE: Dewayne Shorter, we can try a prescription for it and see if covered. Would use IBS-D as diagnosis. Thanks  ----- Message ----- From: Roetta Sessions, CMA Sent: 04/04/2021   1:44 PM EST To: Yetta Flock, MD Subject: xifaxan                                        We don't have sample of Xifaxan to give. She has Pharmacist, community so should I try and send a prescription and see if it's covered?  550 mg TID for 14 days?  Thanks, [name]

## 2021-04-06 NOTE — Telephone Encounter (Signed)
PA for Xifaxan 550 mg TID for 14 days submitted via CoverMyMeds for IBS-D: Key: [id number].  APPROVED.

## 2021-04-06 NOTE — Telephone Encounter (Signed)
Called and spoke to patient. Let her know it was approved but we will need to see if it is affordable. May be able to use a coupon card. ?  She will let us know when she hears from [facility]

## 2021-04-07 ENCOUNTER — Telehealth: Payer: Self-pay

## 2021-04-07 ENCOUNTER — Other Ambulatory Visit: Payer: Self-pay

## 2021-04-07 MED ORDER — RIFAXIMIN 550 MG PO TABS: 550.0000 mg | ORAL_TABLET | Freq: Three times a day (TID) | ORAL | 0 refills | Status: DC

## 2021-04-07 NOTE — Telephone Encounter (Signed)
PA approved for BID allow it was submitted for 42 tablets for 14 days.  Resent script for #28 (which is BID for 14 days). Called pharmacy and it is went through at $0.  Called and LM for patient to come to the office and pick up samples for the additional 14 tablets she will need to take TID for 14 days.

## 2021-04-12 ENCOUNTER — Other Ambulatory Visit: Payer: Self-pay | Admitting: Gastroenterology

## 2021-04-17 ENCOUNTER — Ambulatory Visit (HOSPITAL_COMMUNITY)
Admission: RE | Admit: 2021-04-17 | Discharge: 2021-04-17 | Disposition: A | Discharge status: Home / Self Care | Payer: BC Managed Care – PPO | Source: Ambulatory Visit | Attending: Gastroenterology | Admitting: Gastroenterology

## 2021-04-17 ENCOUNTER — Ambulatory Visit (HOSPITAL_COMMUNITY): Payer: BC Managed Care – PPO

## 2021-04-17 ENCOUNTER — Encounter (HOSPITAL_COMMUNITY): Payer: Self-pay

## 2021-04-17 ENCOUNTER — Other Ambulatory Visit: Payer: Self-pay

## 2021-04-17 DIAGNOSIS — R109 Unspecified abdominal pain: Secondary | ICD-10-CM | POA: Insufficient documentation

## 2021-04-17 DIAGNOSIS — D509 Iron deficiency anemia, unspecified: Secondary | ICD-10-CM | POA: Diagnosis not present

## 2021-04-17 DIAGNOSIS — K529 Noninfective gastroenteritis and colitis, unspecified: Secondary | ICD-10-CM | POA: Diagnosis not present

## 2021-04-17 DIAGNOSIS — K3189 Other diseases of stomach and duodenum: Secondary | ICD-10-CM | POA: Diagnosis not present

## 2021-04-17 MED ORDER — BARIUM SULFATE 0.1 % PO SUSP
450.0000 mL | Freq: Once | ORAL | Status: AC
  Administered 2021-04-17: 450 mL via ORAL

## 2021-04-17 MED ORDER — GADOBUTROL 1 MMOL/ML IV SOLN
10.0000 mL | Freq: Once | INTRAVENOUS | Status: AC | PRN
  Administered 2021-04-17: 10 mL via INTRAVENOUS

## 2021-04-25 ENCOUNTER — Encounter: Payer: Self-pay | Admitting: Gastroenterology

## 2021-04-28 MED ORDER — ALOSETRON HCL 0.5 MG PO TABS: 0.5000 mg | ORAL_TABLET | Freq: Every day | ORAL | 1 refills | Status: DC

## 2021-04-28 NOTE — Telephone Encounter (Signed)
[name] I'd like to order her Alosetron 0.5mg  / day for 30 days with a RF. Can you book her a follow up with me in 1-2 months? Thanks   Will send RX for Alosetron once pt confirms pharmacy. Patient has been scheduled for a f/u appt with Dr. Havery Moros on Friday, 06/02/21 at 9:50 am. Pt notified via my chart.

## 2021-05-04 DIAGNOSIS — G2401 Drug induced subacute dyskinesia: Secondary | ICD-10-CM | POA: Diagnosis not present

## 2021-05-04 DIAGNOSIS — F3173 Bipolar disorder, in partial remission, most recent episode manic: Secondary | ICD-10-CM | POA: Diagnosis not present

## 2021-05-04 DIAGNOSIS — F3175 Bipolar disorder, in partial remission, most recent episode depressed: Secondary | ICD-10-CM | POA: Diagnosis not present

## 2021-05-04 DIAGNOSIS — F411 Generalized anxiety disorder: Secondary | ICD-10-CM | POA: Diagnosis not present

## 2021-05-09 ENCOUNTER — Encounter: Payer: Self-pay | Admitting: Gastroenterology

## 2021-05-09 NOTE — Telephone Encounter (Signed)
A copy of 04/17/21 MRI report has been faxed to Dr. Vania Rea at [facility] per pt request.  ?P: [phone], F: [phone] ?

## 2021-05-12 DIAGNOSIS — E559 Vitamin D deficiency, unspecified: Secondary | ICD-10-CM | POA: Diagnosis not present

## 2021-05-12 DIAGNOSIS — E039 Hypothyroidism, unspecified: Secondary | ICD-10-CM | POA: Diagnosis not present

## 2021-05-12 DIAGNOSIS — E1169 Type 2 diabetes mellitus with other specified complication: Secondary | ICD-10-CM | POA: Diagnosis not present

## 2021-05-12 DIAGNOSIS — I10 Essential (primary) hypertension: Secondary | ICD-10-CM | POA: Diagnosis not present

## 2021-05-14 ENCOUNTER — Encounter: Payer: Self-pay | Admitting: Gastroenterology

## 2021-05-19 DIAGNOSIS — I10 Essential (primary) hypertension: Secondary | ICD-10-CM | POA: Diagnosis not present

## 2021-05-19 DIAGNOSIS — R82998 Other abnormal findings in urine: Secondary | ICD-10-CM | POA: Diagnosis not present

## 2021-05-19 DIAGNOSIS — Z0000 Encounter for general adult medical examination without abnormal findings: Secondary | ICD-10-CM | POA: Diagnosis not present

## 2021-05-24 ENCOUNTER — Other Ambulatory Visit: Payer: Self-pay | Admitting: Gastroenterology

## 2021-05-24 DIAGNOSIS — K2970 Gastritis, unspecified, without bleeding: Secondary | ICD-10-CM

## 2021-06-02 ENCOUNTER — Ambulatory Visit: Payer: BC Managed Care – PPO | Admitting: Gastroenterology

## 2021-06-05 ENCOUNTER — Inpatient Hospital Stay: Payer: BC Managed Care – PPO | Attending: Physician Assistant

## 2021-06-05 ENCOUNTER — Other Ambulatory Visit: Payer: Self-pay

## 2021-06-05 DIAGNOSIS — D509 Iron deficiency anemia, unspecified: Secondary | ICD-10-CM | POA: Diagnosis not present

## 2021-06-05 LAB — CBC WITH DIFFERENTIAL/PLATELET
Abs Immature Granulocytes: 0.06 10*3/uL (ref 0.00–0.07)
Basophils Absolute: 0.1 10*3/uL (ref 0.0–0.1)
Basophils Relative: 1 %
Eosinophils Absolute: 0.3 10*3/uL (ref 0.0–0.5)
Eosinophils Relative: 3 %
HCT: 44.2 % (ref 36.0–46.0)
Hemoglobin: 14.2 g/dL (ref 12.0–15.0)
Immature Granulocytes: 1 %
Lymphocytes Relative: 22 %
Lymphs Abs: 1.6 10*3/uL (ref 0.7–4.0)
MCH: 28.9 pg (ref 26.0–34.0)
MCHC: 32.1 g/dL (ref 30.0–36.0)
MCV: 89.8 fL (ref 80.0–100.0)
Monocytes Absolute: 0.6 10*3/uL (ref 0.1–1.0)
Monocytes Relative: 7 %
Neutro Abs: 5 10*3/uL (ref 1.7–7.7)
Neutrophils Relative %: 66 %
Platelets: 348 10*3/uL (ref 150–400)
RBC: 4.92 MIL/uL (ref 3.87–5.11)
RDW: 13.2 % (ref 11.5–15.5)
WBC: 7.5 10*3/uL (ref 4.0–10.5)
nRBC: 0 % (ref 0.0–0.2)

## 2021-06-05 LAB — COMPREHENSIVE METABOLIC PANEL
ALT: 20 U/L (ref 0–44)
AST: 19 U/L (ref 15–41)
Albumin: 4.4 g/dL (ref 3.5–5.0)
Alkaline Phosphatase: 40 U/L (ref 38–126)
Anion gap: 7 (ref 5–15)
BUN: 17 mg/dL (ref 6–20)
CO2: 25 mmol/L (ref 22–32)
Calcium: 9.5 mg/dL (ref 8.9–10.3)
Chloride: 106 mmol/L (ref 98–111)
Creatinine, Ser: 0.89 mg/dL (ref 0.44–1.00)
GFR, Estimated: >60 mL/min (ref >60)
Glucose, Bld: 124 mg/dL — ABNORMAL HIGH (ref 70–99)
Potassium: 5 mmol/L (ref 3.5–5.1)
Sodium: 138 mmol/L (ref 135–145)
Total Bilirubin: 0.3 mg/dL (ref 0.3–1.2)
Total Protein: 7.3 g/dL (ref 6.5–8.1)

## 2021-06-12 DIAGNOSIS — R339 Retention of urine, unspecified: Secondary | ICD-10-CM | POA: Diagnosis not present

## 2021-06-15 ENCOUNTER — Other Ambulatory Visit: Payer: Self-pay | Admitting: Gastroenterology

## 2021-06-21 ENCOUNTER — Ambulatory Visit (INDEPENDENT_AMBULATORY_CARE_PROVIDER_SITE_OTHER): Payer: BC Managed Care – PPO | Admitting: Gastroenterology

## 2021-06-21 ENCOUNTER — Other Ambulatory Visit: Payer: Self-pay

## 2021-06-21 ENCOUNTER — Encounter: Payer: Self-pay | Admitting: Gastroenterology

## 2021-06-21 VITALS — BP 120/82 | HR 76 | Ht 66.0 in | Wt 222.0 lb

## 2021-06-21 DIAGNOSIS — R932 Abnormal findings on diagnostic imaging of liver and biliary tract: Secondary | ICD-10-CM

## 2021-06-21 DIAGNOSIS — R109 Unspecified abdominal pain: Secondary | ICD-10-CM

## 2021-06-21 DIAGNOSIS — K760 Fatty (change of) liver, not elsewhere classified: Secondary | ICD-10-CM | POA: Diagnosis not present

## 2021-06-21 DIAGNOSIS — K529 Noninfective gastroenteritis and colitis, unspecified: Secondary | ICD-10-CM

## 2021-06-21 MED ORDER — DIPHENOXYLATE-ATROPINE 2.5-0.025 MG PO TABS: 1.0000 | ORAL_TABLET | Freq: Four times a day (QID) | ORAL | 1 refills | Status: DC | PRN

## 2021-06-21 NOTE — Progress Notes (Signed)
? ?HPI :  ?41 year old female here for a follow-up visit - I have seen her most recently for chronic diarrhea and IDA. Recall she saw me for history of iron deficiency anemia for which she went an EGD and colonoscopy with me in 2019 as outlined below without clear cause.  She has a complicated medical history to include bipolar disorder, chronic abdominal pain, pelvic pain, fibromyalgia, hx of IC s/p total cystectomy with [place] pouch. She is s/p hysterectomy, bowel resection when she had a complication from her [place] pouch.. She was on chronic narcotics for several years for chronic pain, has had bilateral celiac plexus blocks in June 2016 and had a neurostimulator in place, which has since been removed. ?  ?Recall she had eventually come off chronic narcotics in August 2021.  Around that time is when her bowels changes she started having loose stools.  She has had persistent loose stools since that time.  States she averages anywhere from 7-10 loose stools per day, no blood in the stool but with significant urgency.  No nocturnal symptoms.  She has had abdominal cramping with this.  Recall she had a work-up with stool testing which showed normal fecal elastase, negative fecal calprotectin, C. difficile negative, negative celiac serologies, had prior breath test that was indeterminate for SIBO.  Trial of Flagyl for a few weeks which did not help at all after her insurance denied Rifaximin..  She has had a trial of cholestyramine for 2 months related for post cholecystectomy state which did not help at all.  She actually had a trial of Colestid as well which did not work. Since the last visit had a trial of Rifaximin which did not work. She had been using Imodium and Lomotil taken troll symptoms but did not prevent it from happening. ? ?Since her last visit she had an MR enterography in light of her IDA and altered anatomy, showed no small bowel inflammation.  After discussion of options we decided to try  alosetron 0.5 mg/day.  She took it for 5 days and states she had significant abdominal pain and cramping and stopped it, symptoms seem to resolve.  She has not tried resuming it.  She states in fact since the trial her bowels have been mostly better.  She does not have the significant frequency and urgency as she had before.  Sometimes her stools are formed and normal.  Has some intermittent lower abdominal discomfort that improves with a bowel movement.  She is taking Bentyl 10 mg twice daily and Lomotil as needed which appears to be working pretty well for her.  We discussed how aggressive she wants to be with management of this moving forward. ? ?Incidentally noted on her MRI she had a small lesion in her liver for which radiology thought might be FNH or hepatic adenoma, they recommended a follow-up MRI in 6 months.  She also had fatty liver incidentally noted.  Her LFTs have been normal.  She has a grandfather who she thinks had liver cancer but no known history of cirrhosis otherwise. ? ? ?Prior workup: ?EGD 01/15/2017 - LA grade A esophagitis, inflammatory nodule at GEJ, normal esophagus otherwise, normal stomach, normal small bowel.  ?  ?Colonoscopy January 2019 showed the following: ?  ?- Patent end-to-end ileo-colonic anastomosis, characterized by healthy appearing mucosa. ?- The examined portion of the ileum was normal. ?- One 3 mm polyp in the transverse colon, removed with a cold biopsy forceps. Resected and ?retrieved. ?- The examination was  otherwise normal on direct and retroflexion views. ?  ?  ?Breath test indeterminate ?TTG negative ?Fecal elastase ?Fecal calprotectin 43 ?C diff negative ?  ?EGD 01/20/21 -  ?- LA Grade A esophagitis was found 40 cm from the incisors. There was some nodular tissue ?at the [employer] [facility] in this area, suspect inflammatory changes. Biopsies were taken with a cold ?forceps for histology. ?- The exam of the esophagus was otherwise normal. ?- Patchy mild inflammation characterized  by adherent blood and friability was found in the ?gastric antrum. ?- The exam of the stomach was otherwise normal. ?- Biopsies were taken with a cold forceps in the gastric body, at the incisura and in the gastric ?antrum for Helicobacter pylori testing. ?- The duodenal bulb and second portion of the duodenum were normal. Biopsies for histology ?were taken with a cold forceps for evaluation of celiac disease. ?  ?Start omeprazole '40mg'$  once daily for 6 weeks ?  ?Colonoscopy 01/20/21 - The perianal and digital rectal examinations were normal. ?- The terminal ileum appeared normal. ?- There was evidence of a prior end-to-side ileo-colonic anastomosis in the ascending colon. ?This was patent and was characterized by healthy appearing mucosa. ?- A few medium-mouthed diverticula were found in the right colon. ?- A 3 mm polyp was found in the transverse colon. The polyp was sessile. The polyp was ?removed with a cold biopsy forceps. Resection and retrieval were complete. I thought a ?photo of it was taken, but it was not. It was benign appearing. ?- Internal hemorrhoids were found during retroflexion. ?- The exam was otherwise without abnormality. ?- Biopsies for histology were taken with a cold forceps from the right colon, left colon and ?transverse colon for evaluation of microscopic colitis. ?  ?1. Surgical [P], duodenum ?- PEPTIC DUODENITIS ?- NO DYSPLASIA OR MALIGNANCY IDENTIFIED ?2. Surgical [P], gastric antrum and gastric body ?- BENIGN GASTRIC MUCOSA WITH MILD REACTIVE CHANGES ?- NO H. PYLORI, INTESTINAL METAPLASIA OR MALIGNANCY IDENTIFIED ?3. Surgical [P], GE junction, polyp (1) ?- HYPERPLASTIC GASTRIC POLYP(S) ?- NO H. PYLORI, INTESTINAL METAPLASIA OR MALIGNANCY IDENTIFIED ?4. Surgical [P], random colon sites ?- BENIGN COLONIC MUCOSA ?- NO ACTIVE INFLAMMATION OR EVIDENCE OF MICROSCOPIC COLITIS ?- NO HIGH-GRADE DYSPLASIA OR MALIGNANCY IDENTIFIED ?5. Surgical [P], colon, transverse, polyp (1) ?- TUBULAR ADENOMA  (1 OF 1 FRAGMENTS) ?- NO HIGH-GRADE DYSPLASIA OR MALIGNANCY IDENTIFIED ?  ?  ?MRE 04/17/21: ?IMPRESSION: ?1. No evidence of acute or significant chronic bowel inflammation. ?2. Stable postsurgical changes of prior cystectomy with [place] ?pouch formation and right-sided periumbilical cystostomy formation. ?3. Arterially enhancing 14 mm segment VI hepatic focus which ?equilibrates to background liver on all additional postcontrast ?sequences and does not demonstrate abnormal intrinsic T1 or T2 ?signal. No evidence of hepatic cirrhosis. Nonspecific imaging ?characteristics but almost certainly reflecting a benign lesion such ?as focal nodular hyperplasia or hepatic adenoma. Consider follow-up ?MRI with and without EOVIST contrast in 6 for more definitive ?characterization and assessment of stability. ?4. Mild diffuse hepatic steatosis. ?5. Horseshoe kidney. ? ? ? ? ? ?Past Medical History:  ?Diagnosis Date  ? ADHD (attention deficit hyperactivity disorder)   ? Allergy   ? Anemia   ? Anxiety   ? Bipolar disorder ([place])   ? Chronic back pain   ? Chronic headaches   ? Chronic interstitial cystitis   ? "removed my bladder when I went to end stage IC"  ? Depression   ? Fibromyalgia   ? GERD (gastroesophageal reflux disease)   ? Gestational  diabetes   ? resolved  ? History of blood transfusion 02/2010  ? "related to OR"  ? History of kidney stones   ? HLD (hyperlipidemia)   ? Hypothyroid   ? Interstitial cystitis   ? Self-catheterizes urinary bladder   ? "I have an [place]; made a bladder using part of my small intestines; cath out of stoma in my belly button" (08/02/2015)  ? Small bowel obstruction ([place]) 08/03/2015  ? Thyroid disease   ? ? ? ?Past Surgical History:  ?Procedure Laterality Date  ? ABDOMINAL HYSTERECTOMY  02/2010  ? "left my ovaries"  ? APPENDECTOMY  02/2010  ? BLADDER REMOVAL  02/2010  ? and [place]  ? CESAREAN SECTION  2009  ? CHOLECYSTECTOMY N/A 05/27/2017  ? Procedure: LAPAROSCOPIC  CHOLECYSTECTOMY;  Surgeon: Ralene Ok, MD;  Location: [facility];  Service: General;  Laterality: N/A;  ? COLONOSCOPY    ? Lindaann [place] pouch  01/19/2021  ? Cystoscopy with bulk amid injection  ? LAPAROSCOPIC LYSIS OF ADHESION

## 2021-06-21 NOTE — Patient Instructions (Addendum)
If you are age 42 or older, your body mass index should be between 23-30. Your Body mass index is 35.83 kg/m?Marland Kitchen If this is out of the aforementioned range listed, please consider follow up with your Primary Care Provider. ? ?If you are age 30 or younger, your body mass index should be between 19-25. Your Body mass index is 35.83 kg/m?Marland Kitchen If this is out of the aformentioned range listed, please consider follow up with your Primary Care Provider.  ? ?________________________________________________________ ? ?The [facility] providers would like to encourage you to use [facility] to communicate with providers for non-urgent requests or questions.  Due to long hold times on the telephone, sending your provider a message by [facility] may be a faster and more efficient way to get a response.  Please allow 48 business hours for a response.  Please remember that this is for non-urgent requests.  ?_______________________________________________________ ? ?You will be due for MRI of the liver in August. ? ?We have sent the following medications to your pharmacy for you to pick up at your convenience: ?Lomotil ? ?Thank you for entrusting me with your care and for choosing [employer], ?Dr. Catron Cellar ? ? ?

## 2021-06-22 DIAGNOSIS — Z01419 Encounter for gynecological examination (general) (routine) without abnormal findings: Secondary | ICD-10-CM | POA: Diagnosis not present

## 2021-06-22 DIAGNOSIS — Z6835 Body mass index (BMI) 35.0-35.9, adult: Secondary | ICD-10-CM | POA: Diagnosis not present

## 2021-06-26 DIAGNOSIS — F3111 Bipolar disorder, current episode manic without psychotic features, mild: Secondary | ICD-10-CM | POA: Diagnosis not present

## 2021-06-26 DIAGNOSIS — F3132 Bipolar disorder, current episode depressed, moderate: Secondary | ICD-10-CM | POA: Diagnosis not present

## 2021-06-26 DIAGNOSIS — F410 Panic disorder [episodic paroxysmal anxiety] without agoraphobia: Secondary | ICD-10-CM | POA: Diagnosis not present

## 2021-06-26 DIAGNOSIS — F900 Attention-deficit hyperactivity disorder, predominantly inattentive type: Secondary | ICD-10-CM | POA: Diagnosis not present

## 2021-07-13 DIAGNOSIS — R339 Retention of urine, unspecified: Secondary | ICD-10-CM | POA: Diagnosis not present

## 2021-07-19 ENCOUNTER — Encounter: Payer: Self-pay | Admitting: Gastroenterology

## 2021-07-26 DIAGNOSIS — F3175 Bipolar disorder, in partial remission, most recent episode depressed: Secondary | ICD-10-CM | POA: Diagnosis not present

## 2021-07-26 DIAGNOSIS — F3173 Bipolar disorder, in partial remission, most recent episode manic: Secondary | ICD-10-CM | POA: Diagnosis not present

## 2021-07-26 DIAGNOSIS — F410 Panic disorder [episodic paroxysmal anxiety] without agoraphobia: Secondary | ICD-10-CM | POA: Diagnosis not present

## 2021-07-26 DIAGNOSIS — G2401 Drug induced subacute dyskinesia: Secondary | ICD-10-CM | POA: Diagnosis not present

## 2021-08-13 ENCOUNTER — Other Ambulatory Visit: Payer: Self-pay | Admitting: Gastroenterology

## 2021-08-14 DIAGNOSIS — R339 Retention of urine, unspecified: Secondary | ICD-10-CM | POA: Diagnosis not present

## 2021-08-18 DIAGNOSIS — M21622 Bunionette of left foot: Secondary | ICD-10-CM | POA: Diagnosis not present

## 2021-08-18 DIAGNOSIS — M79672 Pain in left foot: Secondary | ICD-10-CM | POA: Diagnosis not present

## 2021-08-22 ENCOUNTER — Telehealth: Payer: Self-pay | Admitting: Physician Assistant

## 2021-08-22 NOTE — Telephone Encounter (Signed)
R/s per provider pal, pt has been called and confirmed appt

## 2021-08-28 ENCOUNTER — Encounter: Payer: Self-pay | Admitting: Gastroenterology

## 2021-08-28 DIAGNOSIS — R932 Abnormal findings on diagnostic imaging of liver and biliary tract: Secondary | ICD-10-CM

## 2021-08-28 DIAGNOSIS — K760 Fatty (change of) liver, not elsewhere classified: Secondary | ICD-10-CM

## 2021-08-28 DIAGNOSIS — K769 Liver disease, unspecified: Secondary | ICD-10-CM

## 2021-08-29 ENCOUNTER — Other Ambulatory Visit: Payer: Self-pay | Admitting: Obstetrics & Gynecology

## 2021-08-29 DIAGNOSIS — Z1231 Encounter for screening mammogram for malignant neoplasm of breast: Secondary | ICD-10-CM

## 2021-09-07 ENCOUNTER — Ambulatory Visit (HOSPITAL_COMMUNITY)
Admission: RE | Admit: 2021-09-07 | Discharge: 2021-09-07 | Disposition: A | Discharge status: Home / Self Care | Payer: BC Managed Care – PPO | Source: Ambulatory Visit | Attending: Gastroenterology | Admitting: Gastroenterology

## 2021-09-07 DIAGNOSIS — K760 Fatty (change of) liver, not elsewhere classified: Secondary | ICD-10-CM | POA: Insufficient documentation

## 2021-09-07 DIAGNOSIS — Z9049 Acquired absence of other specified parts of digestive tract: Secondary | ICD-10-CM | POA: Diagnosis not present

## 2021-09-07 DIAGNOSIS — K769 Liver disease, unspecified: Secondary | ICD-10-CM | POA: Diagnosis not present

## 2021-09-07 DIAGNOSIS — R932 Abnormal findings on diagnostic imaging of liver and biliary tract: Secondary | ICD-10-CM | POA: Diagnosis not present

## 2021-09-07 DIAGNOSIS — Q631 Lobulated, fused and horseshoe kidney: Secondary | ICD-10-CM | POA: Diagnosis not present

## 2021-09-07 DIAGNOSIS — R160 Hepatomegaly, not elsewhere classified: Secondary | ICD-10-CM | POA: Diagnosis not present

## 2021-09-07 MED ORDER — GADOXETATE DISODIUM 0.25 MMOL/ML IV SOLN
10.0000 mL | Freq: Once | INTRAVENOUS | Status: AC | PRN
  Administered 2021-09-07: 10 mL via INTRAVENOUS

## 2021-09-08 ENCOUNTER — Ambulatory Visit: Payer: BC Managed Care – PPO | Admitting: Physician Assistant

## 2021-09-08 ENCOUNTER — Other Ambulatory Visit: Payer: BC Managed Care – PPO

## 2021-09-12 DIAGNOSIS — E1169 Type 2 diabetes mellitus with other specified complication: Secondary | ICD-10-CM | POA: Diagnosis not present

## 2021-09-12 DIAGNOSIS — I10 Essential (primary) hypertension: Secondary | ICD-10-CM | POA: Diagnosis not present

## 2021-09-13 ENCOUNTER — Inpatient Hospital Stay: Payer: BC Managed Care – PPO | Attending: Physician Assistant

## 2021-09-13 ENCOUNTER — Inpatient Hospital Stay (HOSPITAL_BASED_OUTPATIENT_CLINIC_OR_DEPARTMENT_OTHER): Payer: BC Managed Care – PPO | Admitting: Physician Assistant

## 2021-09-13 ENCOUNTER — Other Ambulatory Visit: Payer: Self-pay

## 2021-09-13 ENCOUNTER — Other Ambulatory Visit: Payer: Self-pay | Admitting: Physician Assistant

## 2021-09-13 VITALS — BP 117/89 | HR 74 | Resp 20 | Wt 229.1 lb

## 2021-09-13 DIAGNOSIS — R197 Diarrhea, unspecified: Secondary | ICD-10-CM | POA: Diagnosis not present

## 2021-09-13 DIAGNOSIS — Z79899 Other long term (current) drug therapy: Secondary | ICD-10-CM | POA: Insufficient documentation

## 2021-09-13 DIAGNOSIS — R339 Retention of urine, unspecified: Secondary | ICD-10-CM | POA: Diagnosis not present

## 2021-09-13 DIAGNOSIS — D509 Iron deficiency anemia, unspecified: Secondary | ICD-10-CM | POA: Diagnosis not present

## 2021-09-13 DIAGNOSIS — R5383 Other fatigue: Secondary | ICD-10-CM | POA: Diagnosis not present

## 2021-09-13 DIAGNOSIS — R110 Nausea: Secondary | ICD-10-CM | POA: Diagnosis not present

## 2021-09-13 LAB — CMP (CANCER CENTER ONLY)
ALT: 17 U/L (ref 0–44)
AST: 20 U/L (ref 15–41)
Albumin: 4.8 g/dL (ref 3.5–5.0)
Alkaline Phosphatase: 33 U/L — ABNORMAL LOW (ref 38–126)
Anion gap: 7 (ref 5–15)
BUN: 18 mg/dL (ref 6–20)
CO2: 25 mmol/L (ref 22–32)
Calcium: 9.8 mg/dL (ref 8.9–10.3)
Chloride: 105 mmol/L (ref 98–111)
Creatinine: 0.95 mg/dL (ref 0.44–1.00)
GFR, Estimated: >60 mL/min (ref >60)
Glucose, Bld: 97 mg/dL (ref 70–99)
Potassium: 4.3 mmol/L (ref 3.5–5.1)
Sodium: 137 mmol/L (ref 135–145)
Total Bilirubin: 0.5 mg/dL (ref 0.3–1.2)
Total Protein: 7.6 g/dL (ref 6.5–8.1)

## 2021-09-13 LAB — CBC WITH DIFFERENTIAL (CANCER CENTER ONLY)
Abs Immature Granulocytes: 0.06 10*3/uL (ref 0.00–0.07)
Basophils Absolute: 0.1 10*3/uL (ref 0.0–0.1)
Basophils Relative: 1 %
Eosinophils Absolute: 0.2 10*3/uL (ref 0.0–0.5)
Eosinophils Relative: 3 %
HCT: 42.5 % (ref 36.0–46.0)
Hemoglobin: 13.7 g/dL (ref 12.0–15.0)
Immature Granulocytes: 1 %
Lymphocytes Relative: 21 %
Lymphs Abs: 1.8 10*3/uL (ref 0.7–4.0)
MCH: 29 pg (ref 26.0–34.0)
MCHC: 32.2 g/dL (ref 30.0–36.0)
MCV: 90 fL (ref 80.0–100.0)
Monocytes Absolute: 0.6 10*3/uL (ref 0.1–1.0)
Monocytes Relative: 6 %
Neutro Abs: 6 10*3/uL (ref 1.7–7.7)
Neutrophils Relative %: 68 %
Platelet Count: 353 10*3/uL (ref 150–400)
RBC: 4.72 MIL/uL (ref 3.87–5.11)
RDW: 13.9 % (ref 11.5–15.5)
WBC Count: 8.7 10*3/uL (ref 4.0–10.5)
nRBC: 0 % (ref 0.0–0.2)

## 2021-09-13 LAB — IRON AND IRON BINDING CAPACITY (CC-WL,HP ONLY)
Iron: 110 ug/dL (ref 28–170)
Saturation Ratios: 20 % (ref 10.4–31.8)
TIBC: 557 ug/dL — ABNORMAL HIGH (ref 250–450)
UIBC: 447 ug/dL — ABNORMAL HIGH (ref 148–442)

## 2021-09-13 LAB — FERRITIN: Ferritin: 81 ng/mL (ref 11–307)

## 2021-09-13 NOTE — Progress Notes (Signed)
[name]  PROGRESS NOTE  Patient Care Team: Prince Solian, MD as PCP - General (Internal Medicine)  CHIEF COMPLAINTS/PURPOSE OF CONSULTATION:  Iron deficiency anemia  TREATMENT HISTORY: -Received IV feraheme x 2 doses on 05/04/2020 and 05/11/2020 -Received IV venofer x 5 doses on 01/04/2021-02/01/2021  HISTORY OF PRESENTING ILLNESS:   [name] 41 y.o. female returns for a follow up for iron deficiency anemia. She is unaccompanied for this visit.  Patient reports that her energy levels are fairly stable. She continues to have fatigue but is able to do her ADLs. She continues to struggle with diarrhea and is trying to focus on foods that can be triggers. She reports having nausea but it is controlled with zofran that she takes as needed. She denies easy bruising or signs of active bleeding.  She denies any fevers, chills, night sweats, shortness of breath, chest pain or cough. She has no other complaints.   Rest of the pertinent 10 point ROS reviewed and negative.  REVIEW OF SYSTEMS:   Constitutional: Denies fevers, chills or abnormal night sweats Eyes: Denies blurriness of vision, double vision or watery eyes Ears, nose, mouth, throat, and face: Denies mucositis or sore throat Respiratory: Denies cough, dyspnea or wheezes Cardiovascular: Denies palpitation, chest discomfort or lower extremity swelling Gastrointestinal:  +Chronic nausea intermittently. +Diarrhea (chronic and up to 7-8 episodes per day) Skin: Denies abnormal skin rashes Lymphatics: Denies new lymphadenopathy or easy bruising Neurological:Denies numbness, tingling or new weaknesses Behavioral/Psych: Mood is stable, no new changes   All other systems were reviewed with the patient and are negative.  MEDICAL HISTORY:  Past Medical History:  Diagnosis Date   ADHD (attention deficit hyperactivity disorder)    Allergy    Anemia    Anxiety    Bipolar disorder ([place])    Chronic back pain     Chronic headaches    Chronic interstitial cystitis    "removed my bladder when I went to end stage IC"   Depression    Fibromyalgia    GERD (gastroesophageal reflux disease)    Gestational diabetes    resolved   History of blood transfusion 02/2010   "related to OR"   History of kidney stones    HLD (hyperlipidemia)    Hypothyroid    Interstitial cystitis    Self-catheterizes urinary bladder    "I have an [place] pouch; made a bladder using part of my small intestines; cath out of stoma in my belly button" (08/02/2015)   Small bowel obstruction ([place]) 08/03/2015   Thyroid disease     SURGICAL HISTORY: Past Surgical History:  Procedure Laterality Date   ABDOMINAL HYSTERECTOMY  02/2010   "left my ovaries"   APPENDECTOMY  02/2010   BLADDER REMOVAL  02/2010   and [place]  2009   CHOLECYSTECTOMY N/A 05/27/2017   Procedure: LAPAROSCOPIC CHOLECYSTECTOMY;  Surgeon: Ralene Ok, MD;  Location: [facility];  Service: General;  Laterality: N/A;   COLONOSCOPY     [place] pouch  01/19/2021   Cystoscopy with bulk amid injection   LAPAROSCOPIC LYSIS OF ADHESIONS N/A 05/27/2017   Procedure: LAPAROSCOPIC LYSIS OF ADHESIONS;  Surgeon: Ralene Ok, MD;  Location: [facility];  Service: General;  Laterality: N/A;   OTHER SURGICAL HISTORY     remval of urethra when bladder was removed   SMALL INTESTINE SURGERY  2017   for bowel obstruction   SPINAL CORD STIMULATOR IMPLANT  09/2009; ~ 04/2015   SPINAL CORD  STIMULATOR REMOVAL  ~ 04/2015   UPPER GI ENDOSCOPY     WISDOM TOOTH EXTRACTION      SOCIAL HISTORY: Social History   Socioeconomic History   Marital status: Married    Spouse name: Not on file   Number of children: 1   Years of education: Not on file   Highest education level: Not on file  Occupational History   Occupation: Disabled  Tobacco Use   Smoking status: Never   Smokeless tobacco: Never  Vaping Use   Vaping Use: Never used  Substance and Sexual  Activity   Alcohol use: Yes    Comment: 08/03/2015 "glass of wine maybe twice/month"   Drug use: No   Sexual activity: Yes    Comment: hysterectomy  Other Topics Concern   Not on file  Social History Narrative   Not on file   Social Determinants of Health   Financial Resource Strain: Not on file  Food Insecurity: Not on file  Transportation Needs: Not on file  Physical Activity: Not on file  Stress: Not on file  Social Connections: Not on file  Intimate Partner Violence: Not on file    FAMILY HISTORY: Family History  Problem Relation Age of Onset   Lung cancer Mother        smoker   Other Father        benigh tumor between heart and lungs, had surgery and something went wrong, he died at 34   Diabetes Paternal Grandmother    Heart disease Paternal Grandmother    Diabetes Paternal Grandfather    Heart disease Paternal Grandfather    Pancreatic cancer Paternal Grandfather    Liver cancer Paternal Grandfather    Breast cancer Maternal Aunt    Colon cancer Neg Hx    Esophageal cancer Neg Hx    Prostate cancer Neg Hx    Rectal cancer Neg Hx    Stomach cancer Neg Hx     ALLERGIES:  is allergic to keflex [cephalexin], penicillins, and sulfa antibiotics.  MEDICATIONS:  Current Outpatient Medications  Medication Sig Dispense Refill   amantadine (SYMMETREL) 100 MG capsule Take 100 mg by mouth 2 (two) times daily.     amphetamine-dextroamphetamine (ADDERALL) 30 MG tablet Take 30 mg by mouth 2 (two) times daily.     clonazePAM (KLONOPIN) 1 MG tablet Take 1 mg by mouth 2 (two) times daily as needed.     D3-50 1.25 MG (50000 UT) capsule Take 50,000 Units by mouth 3 (three) times a week.     dicyclomine (BENTYL) 10 MG capsule TAKE 1 CAPSULE BY MOUTH TWICE A DAY 180 capsule 1   diphenoxylate-atropine (LOMOTIL) 2.5-0.025 MG tablet TAKE ONE TABLET EVERY 6 HOURS FOR DIARHEA OR LOOSE STOOLS 60 tablet 1   estradiol (ESTRACE) 0.5 MG tablet TAKE ONE TABLET AS NEEDED ONCE A DAY FOR  "MENOPAUSAL SYMPTOMS"     fenofibrate 160 MG tablet Take 160 mg by mouth daily.     lamoTRIgine (LAMICTAL) 200 MG tablet 200 mg daily.     levonorgestrel-ethinyl estradiol (SEASONALE,INTROVALE,JOLESSA) 0.15-0.03 MG tablet Take 1 tablet by mouth daily.     levothyroxine (SYNTHROID, LEVOTHROID) 137 MCG tablet Take 137 mcg by mouth daily before breakfast.      lidocaine (XYLOCAINE) 5 % ointment Apply 1 application topically daily.     lumateperone tosylate (CAPLYTA) 42 MG capsule Take 1 capsule by mouth daily.     metoprolol succinate (TOPROL-XL) 100 MG 24 hr tablet Take 100 mg by mouth  daily.     nitrofurantoin (MACRODANTIN) 50 MG capsule Take 50 mg by mouth daily.     omeprazole (PRILOSEC) 40 MG capsule TAKE 1 CAPSULE (40 MG TOTAL) BY MOUTH DAILY. 90 capsule 1   ondansetron (ZOFRAN) 4 MG tablet Take 2-4 mg by mouth every 8 (eight) hours as needed.     oxybutynin (DITROPAN) 5 MG/5ML syrup 5 mg 2 (two) times daily as needed. Per [place] pouch     rOPINIRole (REQUIP) 1 MG tablet Take 1 mg by mouth at bedtime.     rosuvastatin (CRESTOR) 5 MG tablet Take 1 tablet by mouth daily.     telmisartan (MICARDIS) 40 MG tablet Take 40 mg by mouth daily.     tiZANidine (ZANAFLEX) 4 MG tablet Take 8 mg by mouth 2 (two) times daily as needed.     valbenazine (INGREZZA) 80 MG capsule 80 mg daily.     No current facility-administered medications for this visit.     PHYSICAL EXAMINATION:  ECOG PERFORMANCE STATUS: 0 - Asymptomatic  Vitals:   09/13/21 1248  BP: 117/89  Pulse: 74  Resp: 20  SpO2: 100%   Filed Weights   09/13/21 1248  Weight: 229 lb 1.6 oz (103.9 kg)    GENERAL:alert, no distress and comfortable SKIN:, .  skin color, texture, turgor are normal, no rashes or significant lesions EYES: normal, conjunctiva are pink and non-injected, sclera clear LUNGS: clear to auscultation and percussion with normal breathing effort HEART: regular rate & rhythm and no murmurs and no lower extremity  edema Musculoskeletal:no cyanosis of digits and no clubbing  PSYCH: alert & oriented x 3 with fluent speech NEURO: no focal motor/sensory deficits  LABORATORY DATA:  I have reviewed the data as listed Lab Results  Component Value Date   WBC 8.7 09/13/2021   HGB 13.7 09/13/2021   HCT 42.5 09/13/2021   MCV 90.0 09/13/2021   PLT 353 09/13/2021     Chemistry      Component Value Date/Time   NA 137 09/13/2021 1225   K 4.3 09/13/2021 1225   CL 105 09/13/2021 1225   CO2 25 09/13/2021 1225   BUN 18 09/13/2021 1225   CREATININE 0.95 09/13/2021 1225      Component Value Date/Time   CALCIUM 9.8 09/13/2021 1225   ALKPHOS 33 (L) 09/13/2021 1225   AST 20 09/13/2021 1225   ALT 17 09/13/2021 1225   BILITOT 0.5 09/13/2021 1225      RADIOGRAPHIC STUDIES: I have personally reviewed the radiological images as listed and agreed with the findings in the report. MR Abdomen W Wo Contrast  Result Date: 09/08/2021 CLINICAL DATA:  Liver lesion follow-up EXAM: MRI ABDOMEN WITHOUT AND WITH CONTRAST TECHNIQUE: Multiplanar multisequence MR imaging of the abdomen was performed both before and after the administration of intravenous contrast. CONTRAST:  45m EOVIST GADOXETATE DISODIUM 0.25 MOL/L IV SOLN COMPARISON:  MR enterography 04/17/2021 FINDINGS: Lower chest: No acute findings. Hepatobiliary: Liver is enlarged measuring 19 cm in length. Evidence of hepatic steatosis. No hepatic lesions identified, the previous enhancing focus is not visualized. Gallbladder is surgically absent. No biliary ductal dilatation. Pancreas: No mass, inflammatory changes, or other parenchymal abnormality identified. Spleen:  Within normal limits in size and appearance. Adrenals/Urinary Tract: Adrenal glands appear normal. Horseshoe configuration of the kidneys. No enhancing renal mass or hydronephrosis identified. Stomach/Bowel: Visualized portions within the abdomen are unremarkable. Vascular/Lymphatic: No pathologically enlarged  lymph nodes identified. No abdominal aortic aneurysm demonstrated. Other:  No ascites. Musculoskeletal: No  suspicious bone lesions identified. IMPRESSION: 1. Hepatomegaly and hepatic steatosis. 2. No hepatic lesions identified. 3. Horseshoe kidney. Electronically Signed   By: Ofilia Neas M.D.   On: 09/08/2021 08:38     ASSESSMENT & PLAN:  [name] is a 41 y.o. female who returns for follow-up for iron deficiency anemia.    #Iron deficiency anemia: --Unable to tolerate oral iron due to GI intolerance --Underwent EGD/colonoscopy on 01/20/2021. Findings suggest that gastritis could be the underlying cause of iron deficiency.  --Under the care of Dr. Havery Moros in [place] today shows no evidence of anemia and iron levels still normal. Hgb 13.7, Ferritin 81, Saturation 20%. --No need for IV iron at this time.  --RTC in 6 months with labs   All questions were answered. The patient knows to call the clinic with any problems, questions or concerns.  I have spent a total of 25 minutes minutes of face-to-face and non-face-to-face time, preparing to see the patient,  performing a medically appropriate examination, counseling and educating the patient,  documenting clinical information in the electronic health record, and care coordination.    Lincoln Brigham, PA-C Hematology and [place] at [facility]

## 2021-09-14 ENCOUNTER — Telehealth: Payer: Self-pay

## 2021-09-14 NOTE — Telephone Encounter (Signed)
-----   Message from Lincoln Brigham, PA-C sent at 09/13/2021 11:26 PM EDT ----- Please notify patient that there is no evidence of iron deficiency or anemia. Okay to monitor and no need for additional IV iron at this time.

## 2021-09-14 NOTE — Telephone Encounter (Signed)
Pt advised and voiced understanding.   

## 2021-09-29 ENCOUNTER — Other Ambulatory Visit: Payer: Self-pay | Admitting: Gastroenterology

## 2021-09-29 DIAGNOSIS — K2970 Gastritis, unspecified, without bleeding: Secondary | ICD-10-CM

## 2021-10-03 DIAGNOSIS — F410 Panic disorder [episodic paroxysmal anxiety] without agoraphobia: Secondary | ICD-10-CM | POA: Diagnosis not present

## 2021-10-03 DIAGNOSIS — F3174 Bipolar disorder, in full remission, most recent episode manic: Secondary | ICD-10-CM | POA: Diagnosis not present

## 2021-10-03 DIAGNOSIS — F900 Attention-deficit hyperactivity disorder, predominantly inattentive type: Secondary | ICD-10-CM | POA: Diagnosis not present

## 2021-10-03 DIAGNOSIS — F3132 Bipolar disorder, current episode depressed, moderate: Secondary | ICD-10-CM | POA: Diagnosis not present

## 2021-10-05 DIAGNOSIS — F319 Bipolar disorder, unspecified: Secondary | ICD-10-CM | POA: Diagnosis not present

## 2021-10-05 DIAGNOSIS — R413 Other amnesia: Secondary | ICD-10-CM | POA: Diagnosis not present

## 2021-10-05 DIAGNOSIS — E559 Vitamin D deficiency, unspecified: Secondary | ICD-10-CM | POA: Diagnosis not present

## 2021-10-05 DIAGNOSIS — E039 Hypothyroidism, unspecified: Secondary | ICD-10-CM | POA: Diagnosis not present

## 2021-10-05 DIAGNOSIS — F329 Major depressive disorder, single episode, unspecified: Secondary | ICD-10-CM | POA: Diagnosis not present

## 2021-10-10 ENCOUNTER — Other Ambulatory Visit: Payer: Self-pay | Admitting: Internal Medicine

## 2021-10-10 DIAGNOSIS — R413 Other amnesia: Secondary | ICD-10-CM

## 2021-10-12 ENCOUNTER — Ambulatory Visit
Admission: RE | Admit: 2021-10-12 | Discharge: 2021-10-12 | Disposition: A | Discharge status: Home / Self Care | Payer: BC Managed Care – PPO | Source: Ambulatory Visit | Attending: Obstetrics & Gynecology | Admitting: Obstetrics & Gynecology

## 2021-10-12 DIAGNOSIS — Z1231 Encounter for screening mammogram for malignant neoplasm of breast: Secondary | ICD-10-CM | POA: Diagnosis not present

## 2021-10-16 DIAGNOSIS — R339 Retention of urine, unspecified: Secondary | ICD-10-CM | POA: Diagnosis not present

## 2021-10-22 ENCOUNTER — Ambulatory Visit
Admission: RE | Admit: 2021-10-22 | Discharge: 2021-10-22 | Disposition: A | Discharge status: Home / Self Care | Payer: BC Managed Care – PPO | Source: Ambulatory Visit | Attending: Internal Medicine | Admitting: Internal Medicine

## 2021-10-22 DIAGNOSIS — R413 Other amnesia: Secondary | ICD-10-CM | POA: Diagnosis not present

## 2021-10-31 ENCOUNTER — Encounter: Payer: Self-pay | Admitting: Gastroenterology

## 2021-11-15 DIAGNOSIS — R339 Retention of urine, unspecified: Secondary | ICD-10-CM | POA: Diagnosis not present

## 2021-11-17 DIAGNOSIS — G2401 Drug induced subacute dyskinesia: Secondary | ICD-10-CM | POA: Diagnosis not present

## 2021-11-17 DIAGNOSIS — F410 Panic disorder [episodic paroxysmal anxiety] without agoraphobia: Secondary | ICD-10-CM | POA: Diagnosis not present

## 2021-11-17 DIAGNOSIS — F3174 Bipolar disorder, in full remission, most recent episode manic: Secondary | ICD-10-CM | POA: Diagnosis not present

## 2021-11-17 DIAGNOSIS — F3175 Bipolar disorder, in partial remission, most recent episode depressed: Secondary | ICD-10-CM | POA: Diagnosis not present

## 2021-11-20 DIAGNOSIS — E781 Pure hyperglyceridemia: Secondary | ICD-10-CM | POA: Diagnosis not present

## 2021-11-21 DIAGNOSIS — M21622 Bunionette of left foot: Secondary | ICD-10-CM | POA: Diagnosis not present

## 2021-11-21 DIAGNOSIS — M79672 Pain in left foot: Secondary | ICD-10-CM | POA: Diagnosis not present

## 2021-12-04 DIAGNOSIS — M545 Low back pain, unspecified: Secondary | ICD-10-CM | POA: Diagnosis not present

## 2021-12-11 DIAGNOSIS — M545 Low back pain, unspecified: Secondary | ICD-10-CM | POA: Diagnosis not present

## 2021-12-14 ENCOUNTER — Encounter: Payer: Self-pay | Admitting: Gastroenterology

## 2021-12-14 DIAGNOSIS — R195 Other fecal abnormalities: Secondary | ICD-10-CM

## 2021-12-15 DIAGNOSIS — R339 Retention of urine, unspecified: Secondary | ICD-10-CM | POA: Diagnosis not present

## 2021-12-16 ENCOUNTER — Other Ambulatory Visit: Payer: Self-pay | Admitting: Gastroenterology

## 2021-12-16 DIAGNOSIS — K2970 Gastritis, unspecified, without bleeding: Secondary | ICD-10-CM

## 2021-12-19 DIAGNOSIS — N390 Urinary tract infection, site not specified: Secondary | ICD-10-CM | POA: Diagnosis not present

## 2021-12-20 DIAGNOSIS — M545 Low back pain, unspecified: Secondary | ICD-10-CM | POA: Diagnosis not present

## 2021-12-22 DIAGNOSIS — M545 Low back pain, unspecified: Secondary | ICD-10-CM | POA: Diagnosis not present

## 2021-12-25 ENCOUNTER — Encounter: Payer: Self-pay | Admitting: Gastroenterology

## 2021-12-27 DIAGNOSIS — M545 Low back pain, unspecified: Secondary | ICD-10-CM | POA: Diagnosis not present

## 2021-12-28 DIAGNOSIS — M79672 Pain in left foot: Secondary | ICD-10-CM | POA: Diagnosis not present

## 2021-12-29 DIAGNOSIS — M545 Low back pain, unspecified: Secondary | ICD-10-CM | POA: Diagnosis not present

## 2022-01-01 DIAGNOSIS — M545 Low back pain, unspecified: Secondary | ICD-10-CM | POA: Diagnosis not present

## 2022-01-01 DIAGNOSIS — M5416 Radiculopathy, lumbar region: Secondary | ICD-10-CM | POA: Diagnosis not present

## 2022-01-03 DIAGNOSIS — M545 Low back pain, unspecified: Secondary | ICD-10-CM | POA: Diagnosis not present

## 2022-01-05 DIAGNOSIS — M545 Low back pain, unspecified: Secondary | ICD-10-CM | POA: Diagnosis not present

## 2022-01-10 DIAGNOSIS — M51379 Other intervertebral disc degeneration, lumbosacral region without mention of lumbar back pain or lower extremity pain: Secondary | ICD-10-CM | POA: Insufficient documentation

## 2022-01-10 DIAGNOSIS — M1990 Unspecified osteoarthritis, unspecified site: Secondary | ICD-10-CM | POA: Insufficient documentation

## 2022-01-10 DIAGNOSIS — Q059 Spina bifida, unspecified: Secondary | ICD-10-CM

## 2022-01-10 DIAGNOSIS — M51369 Other intervertebral disc degeneration, lumbar region without mention of lumbar back pain or lower extremity pain: Secondary | ICD-10-CM

## 2022-01-10 DIAGNOSIS — M5136 Other intervertebral disc degeneration, lumbar region: Secondary | ICD-10-CM

## 2022-01-10 HISTORY — DX: Other intervertebral disc degeneration, lumbar region: M51.36

## 2022-01-10 HISTORY — DX: Spina bifida, unspecified: Q05.9

## 2022-01-10 HISTORY — DX: Other intervertebral disc degeneration, lumbar region without mention of lumbar back pain or lower extremity pain: M51.369

## 2022-01-11 ENCOUNTER — Other Ambulatory Visit: Payer: Self-pay | Admitting: Gastroenterology

## 2022-01-11 NOTE — Telephone Encounter (Signed)
Please advise Sir, thank you. 

## 2022-01-12 NOTE — Telephone Encounter (Signed)
If she is still using it, can refill to use PRN. thanks

## 2022-01-15 DIAGNOSIS — R339 Retention of urine, unspecified: Secondary | ICD-10-CM | POA: Diagnosis not present

## 2022-01-17 DIAGNOSIS — M5451 Vertebrogenic low back pain: Secondary | ICD-10-CM | POA: Diagnosis not present

## 2022-01-18 DIAGNOSIS — M5416 Radiculopathy, lumbar region: Secondary | ICD-10-CM | POA: Diagnosis not present

## 2022-01-18 DIAGNOSIS — M6281 Muscle weakness (generalized): Secondary | ICD-10-CM | POA: Diagnosis not present

## 2022-01-18 DIAGNOSIS — Q059 Spina bifida, unspecified: Secondary | ICD-10-CM | POA: Diagnosis not present

## 2022-01-22 DIAGNOSIS — M5416 Radiculopathy, lumbar region: Secondary | ICD-10-CM | POA: Diagnosis not present

## 2022-01-22 DIAGNOSIS — Q059 Spina bifida, unspecified: Secondary | ICD-10-CM | POA: Diagnosis not present

## 2022-01-22 DIAGNOSIS — M6281 Muscle weakness (generalized): Secondary | ICD-10-CM | POA: Diagnosis not present

## 2022-01-23 DIAGNOSIS — M479 Spondylosis, unspecified: Secondary | ICD-10-CM | POA: Diagnosis not present

## 2022-01-23 DIAGNOSIS — M5136 Other intervertebral disc degeneration, lumbar region: Secondary | ICD-10-CM | POA: Diagnosis not present

## 2022-01-29 DIAGNOSIS — M5416 Radiculopathy, lumbar region: Secondary | ICD-10-CM | POA: Diagnosis not present

## 2022-01-29 DIAGNOSIS — Q059 Spina bifida, unspecified: Secondary | ICD-10-CM | POA: Diagnosis not present

## 2022-01-29 DIAGNOSIS — M6281 Muscle weakness (generalized): Secondary | ICD-10-CM | POA: Diagnosis not present

## 2022-01-30 ENCOUNTER — Ambulatory Visit: Payer: BC Managed Care – PPO | Admitting: Physician Assistant

## 2022-01-30 DIAGNOSIS — I10 Essential (primary) hypertension: Secondary | ICD-10-CM | POA: Diagnosis not present

## 2022-01-30 DIAGNOSIS — E1169 Type 2 diabetes mellitus with other specified complication: Secondary | ICD-10-CM | POA: Diagnosis not present

## 2022-01-31 DIAGNOSIS — M6281 Muscle weakness (generalized): Secondary | ICD-10-CM | POA: Diagnosis not present

## 2022-01-31 DIAGNOSIS — Q059 Spina bifida, unspecified: Secondary | ICD-10-CM | POA: Diagnosis not present

## 2022-01-31 DIAGNOSIS — M5416 Radiculopathy, lumbar region: Secondary | ICD-10-CM | POA: Diagnosis not present

## 2022-02-01 DIAGNOSIS — M47816 Spondylosis without myelopathy or radiculopathy, lumbar region: Secondary | ICD-10-CM | POA: Diagnosis not present

## 2022-02-05 DIAGNOSIS — M5416 Radiculopathy, lumbar region: Secondary | ICD-10-CM | POA: Diagnosis not present

## 2022-02-05 DIAGNOSIS — Q059 Spina bifida, unspecified: Secondary | ICD-10-CM | POA: Diagnosis not present

## 2022-02-05 DIAGNOSIS — M6281 Muscle weakness (generalized): Secondary | ICD-10-CM | POA: Diagnosis not present

## 2022-02-07 DIAGNOSIS — Q059 Spina bifida, unspecified: Secondary | ICD-10-CM | POA: Diagnosis not present

## 2022-02-07 DIAGNOSIS — M6281 Muscle weakness (generalized): Secondary | ICD-10-CM | POA: Diagnosis not present

## 2022-02-07 DIAGNOSIS — M5416 Radiculopathy, lumbar region: Secondary | ICD-10-CM | POA: Diagnosis not present

## 2022-02-13 DIAGNOSIS — M797 Fibromyalgia: Secondary | ICD-10-CM | POA: Diagnosis not present

## 2022-02-13 DIAGNOSIS — M47896 Other spondylosis, lumbar region: Secondary | ICD-10-CM | POA: Diagnosis not present

## 2022-02-14 DIAGNOSIS — R339 Retention of urine, unspecified: Secondary | ICD-10-CM | POA: Diagnosis not present

## 2022-02-20 DIAGNOSIS — F3131 Bipolar disorder, current episode depressed, mild: Secondary | ICD-10-CM | POA: Diagnosis not present

## 2022-02-20 DIAGNOSIS — F3111 Bipolar disorder, current episode manic without psychotic features, mild: Secondary | ICD-10-CM | POA: Diagnosis not present

## 2022-02-28 DIAGNOSIS — Z130 Encounter for screening for diseases of the blood and blood-forming organs and certain disorders involving the immune mechanism: Secondary | ICD-10-CM | POA: Diagnosis not present

## 2022-02-28 DIAGNOSIS — Z5181 Encounter for therapeutic drug level monitoring: Secondary | ICD-10-CM | POA: Diagnosis not present

## 2022-03-12 ENCOUNTER — Encounter: Payer: Self-pay | Admitting: Gastroenterology

## 2022-03-12 ENCOUNTER — Ambulatory Visit (INDEPENDENT_AMBULATORY_CARE_PROVIDER_SITE_OTHER): Payer: BC Managed Care – PPO | Admitting: Gastroenterology

## 2022-03-12 VITALS — BP 120/80 | HR 82 | Ht 66.0 in | Wt 241.0 lb

## 2022-03-12 DIAGNOSIS — G894 Chronic pain syndrome: Secondary | ICD-10-CM

## 2022-03-12 DIAGNOSIS — Z6838 Body mass index (BMI) 38.0-38.9, adult: Secondary | ICD-10-CM

## 2022-03-12 DIAGNOSIS — Z791 Long term (current) use of non-steroidal anti-inflammatories (NSAID): Secondary | ICD-10-CM

## 2022-03-12 DIAGNOSIS — K760 Fatty (change of) liver, not elsewhere classified: Secondary | ICD-10-CM

## 2022-03-12 DIAGNOSIS — K529 Noninfective gastroenteritis and colitis, unspecified: Secondary | ICD-10-CM

## 2022-03-12 MED ORDER — DIPHENOXYLATE-ATROPINE 2.5-0.025 MG PO TABS: 1.0000 | ORAL_TABLET | Freq: Four times a day (QID) | ORAL | 2 refills | Status: DC

## 2022-03-12 NOTE — Progress Notes (Signed)
HPI :  42 year old female here for follow-up visit.  Recall have been following her for chronic diarrhea, IDA.  Recall she saw me for history of iron deficiency anemia for which she went an EGD and colonoscopy with me in 2019 as outlined below without clear cause.  She has a complicated medical history to include bipolar disorder, chronic abdominal pain, pelvic pain, fibromyalgia, hx of IC s/p total cystectomy with [place] pouch. She is s/p hysterectomy, bowel resection when she had a complication from her [place] pouch.. She was on chronic narcotics for several years for chronic pain, has had bilateral celiac plexus blocks in June 2016 and had a neurostimulator in place, which has since been removed. She had eventually come off chronic narcotics in August 2021.  Around that time is when her bowels changes she started having loose stools.    Recall she had a work-up with stool testing which showed normal fecal elastase, negative fecal calprotectin, C. difficile negative, negative celiac serologies, had prior breath test that was indeterminate for SIBO.  Trial of Flagyl for a few weeks which did not help at all after her insurance denied Rifaximin. Then able to trial Rifaximin which did not help. She has had a trial of cholestyramine for 2 months related for post cholecystectomy state which did not help at all.  She actually had a trial of Colestid as well which did not work. She had been using Imodium in the past which did not help, and Lomotil PRN which helps somewhat. Also on chronic dicyclomine which does provide some benefit. We have tried her on alosetron 0.5 mg/day in the past which led to a significant abdominal pain and cramping so it had to be stopped.  Her abdominal pain resolved after she stopped this. Recall she has also had an MR enterography in light of her IDA and altered anatomy, showed no small bowel inflammation.  This incidentally showed a small lesion in her liver for which a follow-up  MRI was performed.  This was done this past July 2023 which showed hepatomegaly with steatosis, no focal liver lesion.  I last saw her in April 2023.  She states overall she has been doing okay since have last seen her.  She has some days where she feels well and other days where she does not feel as well.  Her bowel frequency can fluctuate anywhere from 4-5 times a day towards 10 times per day on a bad day.  Her stool is often loose and sometimes "flaky".  There is no blood in her stool.  She does take dicyclomine a few times daily and using Lomotil as needed which does help at times.  She does have an occasional nocturnal stool.  She was prescribed liquid Ditropan to use for her bladder although she has not been taking it much lately.  She is never tried that orally.  She otherwise describes a burning sensation in her left abdomen rather diffusely.  It is been ongoing for a while, it looks like at least since this past October.  She tends to feel it late in the mornings through the afternoon and then Gratz resolves.  It does not really related to her bowel movements that she can tell.  She tried increasing her omeprazole which did not really provide too much benefit.  She has some nausea but no real vomiting.  Takes Zofran as needed.  Denies any trauma to her abdomen.  Recall as part of her chronic pain regimen she has  been on Elavil, Cymbalta, Lyrica, gabapentin in the past none of which have helped.  She has fortunately been off all narcotics but is using Aleve 2 tabs twice daily every day mostly for her back and joint pains.  She is also using Tylenol as needed.  She is not drinking any alcohol.  She has gained some weight since have last seen her.  BMI 38.9. She is working on weight loss and inquires about the new medications offered to treat that.  She has never seen a weight loss specialist.  In regards to her fatty liver disease her liver function testing has remained pretty normal over the years.   Last checked in July, ALT was 17, AST 20.     Prior workup: EGD 01/15/2017 - LA grade A esophagitis, inflammatory nodule at GEJ, normal esophagus otherwise, normal stomach, normal small bowel.    Colonoscopy January 2019 showed the following:   - Patent end-to-end ileo-colonic anastomosis, characterized by healthy appearing mucosa. - The examined portion of the ileum was normal. - One 3 mm polyp in the transverse colon, removed with a cold biopsy forceps. Resected and retrieved. - The examination was otherwise normal on direct and retroflexion views.   Breath test indeterminate TTG negative Fecal elastase Fecal calprotectin 43 C diff negative   EGD 01/20/21 -  - LA Grade A esophagitis was found 40 cm from the incisors. There was some nodular tissue at the GEJ in this area, suspect inflammatory changes. Biopsies were taken with a cold forceps for histology. - The exam of the esophagus was otherwise normal. - Patchy mild inflammation characterized by adherent blood and friability was found in the gastric antrum. - The exam of the stomach was otherwise normal. - Biopsies were taken with a cold forceps in the gastric body, at the incisura and in the gastric antrum for Helicobacter pylori testing. - The duodenal bulb and second portion of the duodenum were normal. Biopsies for histology were taken with a cold forceps for evaluation of celiac disease.   Start omeprazole '40mg'$  once daily for 6 weeks   Colonoscopy 01/20/21 - The perianal and digital rectal examinations were normal. - The terminal ileum appeared normal. - There was evidence of a prior end-to-side ileo-colonic anastomosis in the ascending colon. This was patent and was characterized by healthy appearing mucosa. - A few medium-mouthed diverticula were found in the right colon. - A 3 mm polyp was found in the transverse colon. The polyp was sessile. The polyp was removed with a cold biopsy forceps. Resection and  retrieval were complete. I thought a photo of it was taken, but it was not. It was benign appearing. - Internal hemorrhoids were found during retroflexion. - The exam was otherwise without abnormality. - Biopsies for histology were taken with a cold forceps from the right colon, left colon and transverse colon for evaluation of microscopic colitis.   1. Surgical [P], duodenum - PEPTIC DUODENITIS - NO DYSPLASIA OR MALIGNANCY IDENTIFIED 2. Surgical [P], gastric antrum and gastric body - BENIGN GASTRIC MUCOSA WITH MILD REACTIVE CHANGES - NO H. PYLORI, INTESTINAL METAPLASIA OR MALIGNANCY IDENTIFIED 3. Surgical [P], GE junction, polyp (1) - HYPERPLASTIC GASTRIC POLYP(S) - NO H. PYLORI, INTESTINAL METAPLASIA OR MALIGNANCY IDENTIFIED 4. Surgical [P], random colon sites - BENIGN COLONIC MUCOSA - NO ACTIVE INFLAMMATION OR EVIDENCE OF MICROSCOPIC COLITIS - NO HIGH-GRADE DYSPLASIA OR MALIGNANCY IDENTIFIED 5. Surgical [P], colon, transverse, polyp (1) - TUBULAR ADENOMA (1 OF 1 FRAGMENTS) - NO HIGH-GRADE DYSPLASIA OR  MALIGNANCY IDENTIFIED     MRE 04/17/21: IMPRESSION: 1. No evidence of acute or significant chronic bowel inflammation. 2. Stable postsurgical changes of prior cystectomy with [place] pouch formation and right-sided periumbilical cystostomy formation. 3. Arterially enhancing 14 mm segment VI hepatic focus which equilibrates to background liver on all additional postcontrast sequences and does not demonstrate abnormal intrinsic T1 or T2 signal. No evidence of hepatic cirrhosis. Nonspecific imaging characteristics but almost certainly reflecting a benign lesion such as focal nodular hyperplasia or hepatic adenoma. Consider follow-up MRI with and without EOVIST contrast in 6 for more definitive characterization and assessment of stability. 4. Mild diffuse hepatic steatosis. 5. Horseshoe kidney.   MRI abdomen 09/08/21: IMPRESSION: 1. Hepatomegaly and hepatic steatosis. 2. No  hepatic lesions identified. 3. Horseshoe kidney.  Labs July normal LFTs, CBC, iron studies   Past Medical History:  Diagnosis Date   ADHD (attention deficit hyperactivity disorder)    Allergy    Anemia    Anxiety    Bipolar disorder ([place])    Chronic back pain    Chronic headaches    Chronic interstitial cystitis    "removed my bladder when I went to end stage IC"   Depression    Disc degeneration, lumbar 01/10/2022   Fibromyalgia    GERD (gastroesophageal reflux disease)    Gestational diabetes    resolved   History of blood transfusion 02/2010   "related to OR"   History of kidney stones    HLD (hyperlipidemia)    Hypothyroid    IBS (irritable colon syndrome)    IDA (iron deficiency anemia)    Interstitial cystitis 06/2008   Dr. Molli [facility]   Polycystic ovarian disease    Preeclampsia 2009   Self-catheterizes urinary bladder    "I have an [place] pouch; made a bladder using part of my small intestines; cath out of stoma in my belly button" (08/02/2015)   Small bowel obstruction ([place]) 08/03/2015   Spina bifida ([place]) 01/10/2022   Thyroid disease    Vitamin D deficiency      Past Surgical History:  Procedure Laterality Date   ABDOMINAL HYSTERECTOMY  02/2010   "left my ovaries"   APPENDECTOMY  02/2010   BLADDER REMOVAL  02/2010   and [place]  2009   CHOLECYSTECTOMY N/A 05/27/2017   Procedure: LAPAROSCOPIC CHOLECYSTECTOMY;  Surgeon: Ralene Ok, MD;  Location: [facility];  Service: General;  Laterality: N/A;   COLONOSCOPY     [place] pouch  01/19/2021   Cystoscopy with bulk amid injection   LAPAROSCOPIC LYSIS OF ADHESIONS N/A 05/27/2017   Procedure: LAPAROSCOPIC LYSIS OF ADHESIONS;  Surgeon: Ralene Ok, MD;  Location: [facility];  Service: General;  Laterality: N/A;   OTHER SURGICAL HISTORY     remval of urethra when bladder was removed   SMALL INTESTINE SURGERY  2017   for bowel obstruction   SPINAL CORD STIMULATOR IMPLANT   09/2009; ~ 04/2015   SPINAL CORD STIMULATOR REMOVAL  ~ 04/2015   UPPER GI ENDOSCOPY     WISDOM TOOTH EXTRACTION     Family History  Problem Relation Age of Onset   Lung cancer Mother        smoker   Other Father        benigh tumor between heart and lungs, had surgery and something went wrong, he died at 54   Diabetes Paternal Grandmother    Heart disease Paternal Grandmother    Diabetes Paternal Grandfather  Heart disease Paternal Grandfather    Pancreatic cancer Paternal Grandfather    Liver cancer Paternal Grandfather    Breast cancer Maternal Aunt    Colon cancer Neg Hx    Esophageal cancer Neg Hx    Prostate cancer Neg Hx    Rectal cancer Neg Hx    Stomach cancer Neg Hx    Social History   Tobacco Use   Smoking status: Never   Smokeless tobacco: Never  Vaping Use   Vaping Use: Never used  Substance Use Topics   Alcohol use: Yes    Comment: 08/03/2015 "glass of wine maybe twice/month"   Drug use: No   Current Outpatient Medications  Medication Sig Dispense Refill   amantadine (SYMMETREL) 100 MG capsule Take 100 mg by mouth 2 (two) times daily.     amphetamine-dextroamphetamine (ADDERALL) 30 MG tablet Take 30 mg by mouth 2 (two) times daily.     clonazePAM (KLONOPIN) 1 MG tablet Take 1 mg by mouth 2 (two) times daily as needed.     D3-50 1.25 MG (50000 UT) capsule Take 50,000 Units by mouth 3 (three) times a week.     dicyclomine (BENTYL) 10 MG capsule TAKE 1 CAPSULE BY MOUTH TWICE A DAY 180 capsule 1   diphenoxylate-atropine (LOMOTIL) 2.5-0.025 MG tablet TAKE 1 TABLET EVERY 6 HOURS AS NEEDED FOR DIARRHEA OR LOOSE STOOLS 90 tablet 1   fenofibrate 160 MG tablet Take 160 mg by mouth daily.     lamoTRIgine (LAMICTAL) 200 MG tablet 200 mg daily.     levonorgestrel-ethinyl estradiol (SEASONALE,INTROVALE,JOLESSA) 0.15-0.03 MG tablet Take 1 tablet by mouth daily.     levothyroxine (SYNTHROID, LEVOTHROID) 137 MCG tablet Take 137 mcg by mouth daily before breakfast.       lidocaine (XYLOCAINE) 5 % ointment Apply 1 application topically daily.     lumateperone tosylate (CAPLYTA) 42 MG capsule Take 1 capsule by mouth daily.     metoprolol succinate (TOPROL-XL) 100 MG 24 hr tablet Take 100 mg by mouth daily.     nitrofurantoin (MACRODANTIN) 50 MG capsule Take 50 mg by mouth daily.     omeprazole (PRILOSEC) 40 MG capsule TAKE 1 CAPSULE (40 MG TOTAL) BY MOUTH DAILY. 90 capsule 0   ondansetron (ZOFRAN) 4 MG tablet Take 2-4 mg by mouth every 8 (eight) hours as needed.     oxybutynin (DITROPAN) 5 MG/5ML syrup 5 mg 2 (two) times daily as needed. Per [place] pouch     rOPINIRole (REQUIP) 1 MG tablet Take 1 mg by mouth at bedtime.     rosuvastatin (CRESTOR) 5 MG tablet Take 1 tablet by mouth daily.     telmisartan (MICARDIS) 40 MG tablet Take 40 mg by mouth daily.     tiZANidine (ZANAFLEX) 4 MG tablet Take 8 mg by mouth 2 (two) times daily as needed.     valbenazine (INGREZZA) 80 MG capsule 80 mg daily.     No current facility-administered medications for this visit.   Allergies  Allergen Reactions   Keflex [Cephalexin] Hives   Penicillins Hives and Other (See Comments)    PATIENT HAS HAD A PCN REACTION WITH IMMEDIATE RASH, FACIAL/TONGUE/THROAT SWELLING, SOB, OR LIGHTHEADEDNESS WITH HYPOTENSION:  #  #  #  YES  #  #  #   Has patient had a PCN reaction causing severe rash involving mucus membranes or skin necrosis: No Has patient had a PCN reaction that required hospitalization No Has patient had a PCN reaction occurring within the last  10 years: No If all of the above answers are "NO", then may proceed with Cephalosporin use.    Sulfa Antibiotics Hives     Review of Systems: All systems reviewed and negative except where noted in HPI.   Lab Results  Component Value Date   WBC 8.7 09/13/2021   HGB 13.7 09/13/2021   HCT 42.5 09/13/2021   MCV 90.0 09/13/2021   PLT 353 09/13/2021    Lab Results  Component Value Date   CREATININE 0.95 09/13/2021   BUN 18  09/13/2021   NA 137 09/13/2021   K 4.3 09/13/2021   CL 105 09/13/2021   CO2 25 09/13/2021    Lab Results  Component Value Date   IRON 110 09/13/2021   TIBC 557 (H) 09/13/2021   FERRITIN 81 09/13/2021     Physical Exam: BP 120/80   Pulse 82   Ht '5\' 6"'$  (1.676 m)   Wt 241 lb (109.3 kg)   SpO2 99%   BMI 38.90 kg/m  Constitutional: Pleasant,well-developed, female in no acute distress. Abdominal: Soft, nondistended, nontender. There are no masses palpable.  Extremities: no edema Neurological: Alert and oriented to person place and time. Skin: Skin is warm and dry. No rashes noted. Psychiatric: Normal mood and affect. Behavior is normal.   ASSESSMENT: 42 y.o. female here for assessment of the following  1. Chronic diarrhea   2. Chronic pain syndrome   3. NSAID long-term use   4. Fatty liver   5. Class 2 severe obesity due to excess calories with serious comorbidity and body mass index (BMI) of 38.0 to 38.9 in adult [facility])     History as above, lung standing chronic diarrhea with extensive evaluation has been unrevealing.  I think functional bowel disorder more than likely.  She has been tried on a variety of regimens without resolution, however doing okay on dicyclomine and Lomotil at baseline.  She does definitely have room to increase Lomotil use more frequently, and this has helped her.  Discussed options and she will escalate Lomotil for now.  I provided reassurance to her about her prior endoscopy, imaging, colonoscopy, lab workup etc.  She is using NSAIDs frequently and discussed risks of doing this.  Long-term I do not think this is a good regimen for her to be on in light of her stomach issues.  Recommend she wean off NSAIDs and use as needed only.  She can use Tylenol routinely and discussed with her how to dose this.  She has had relatively recent imaging of her abdomen I do not see anything concerning there to be causing the burning sensation she is experiencing.  Her  abdominal exam is benign today, I cannot reproduce her symptoms.  For her chronic fibromyalgia, chronic pain syndrome, she has already tried Cymbalta, TCA, Lyrica/gabapentin.  Difficult situation, we would prefer obviously that she avoids narcotics moving forward as well.  I discussed fatty liver with her, and imaging findings.  Fortunately her liver enzymes have been normal but she is at risk for liver disease over time.  I do think she would benefit from weight loss notably for fatty liver but also for her musculoskeletal complaints.  She agrees and wants to work on this, I think she would benefit from a weight loss clinic, I will refer her to Dr. Karen Chafe at [facility] who has a very good program for medical bariatrics.  Will see how she does with his measures.  I will see her back in 6 months  for reassessment, or sooner with issues.  Will be checking LFTs yearly, consider elastography over time pending her course.   PLAN: - stop routine use of NSAIDs - discussed long term risks, she takes quite a bit - use tylenol for pain control, discussing dosing with her - continue dicyclomine - continue Lomotil - can increase dose as needed - 4 per day and titrate as needed (failed numerous other options) - continue Zofran - continue omeprazole - discussed fatty liver, ALT normal - check yearly, consider elastography over time - work on weight loss - refer to Johnette Abraham - Bariatric medicine at [facility] - for weight loss - I think will help her musculoskeletal pain - f/u 6 months  Jolly Mango, MD [facility]

## 2022-03-12 NOTE — Patient Instructions (Addendum)
If you are age 43 or older, your body mass index should be between 23-30. Your Body mass index is 38.9 kg/m. If this is out of the aforementioned range listed, please consider follow up with your Primary Care Provider.  If you are age 73 or younger, your body mass index should be between 19-25. Your Body mass index is 38.9 kg/m. If this is out of the aformentioned range listed, please consider follow up with your Primary Care Provider.   ________________________________________________________   Stop routine use of NSAIDs. Use Tylenol instead.  Continue Zofran, omeprazole, dicyclomine  We have sent the following medications to your pharmacy for you to pick up at your convenience: Lomotil: Take 4 times a day and titrate as needed   We are referring you to Johnette Abraham, MD, [facility].  They will contact you directly to schedule an appointment.  It may take a week or more before you hear from them.  Please feel free to contact us if you have not heard from them within 2 weeks and we will follow up on the referral.   Thank you for entrusting me with your care and for choosing [facility], Dr. Howardwick Cellar

## 2022-03-13 ENCOUNTER — Other Ambulatory Visit: Payer: Self-pay | Admitting: Gastroenterology

## 2022-03-13 ENCOUNTER — Other Ambulatory Visit: Payer: Self-pay | Admitting: Physician Assistant

## 2022-03-13 DIAGNOSIS — K2970 Gastritis, unspecified, without bleeding: Secondary | ICD-10-CM

## 2022-03-13 DIAGNOSIS — E611 Iron deficiency: Secondary | ICD-10-CM

## 2022-03-14 ENCOUNTER — Inpatient Hospital Stay: Payer: BC Managed Care – PPO | Attending: Physician Assistant

## 2022-03-14 ENCOUNTER — Inpatient Hospital Stay (HOSPITAL_BASED_OUTPATIENT_CLINIC_OR_DEPARTMENT_OTHER): Payer: BC Managed Care – PPO | Admitting: Physician Assistant

## 2022-03-14 ENCOUNTER — Other Ambulatory Visit: Payer: Self-pay

## 2022-03-14 VITALS — BP 146/71 | HR 81 | Temp 97.7°F | Resp 14 | Ht 66.0 in | Wt 239.8 lb

## 2022-03-14 DIAGNOSIS — D509 Iron deficiency anemia, unspecified: Secondary | ICD-10-CM | POA: Insufficient documentation

## 2022-03-14 DIAGNOSIS — Z79899 Other long term (current) drug therapy: Secondary | ICD-10-CM | POA: Diagnosis not present

## 2022-03-14 DIAGNOSIS — E611 Iron deficiency: Secondary | ICD-10-CM

## 2022-03-14 LAB — CBC WITH DIFFERENTIAL (CANCER CENTER ONLY)
Abs Immature Granulocytes: 0.09 10*3/uL — ABNORMAL HIGH (ref 0.00–0.07)
Basophils Absolute: 0.1 10*3/uL (ref 0.0–0.1)
Basophils Relative: 1 %
Eosinophils Absolute: 0.2 10*3/uL (ref 0.0–0.5)
Eosinophils Relative: 2 %
HCT: 35.8 % — ABNORMAL LOW (ref 36.0–46.0)
Hemoglobin: 10.7 g/dL — ABNORMAL LOW (ref 12.0–15.0)
Immature Granulocytes: 1 %
Lymphocytes Relative: 21 %
Lymphs Abs: 1.9 10*3/uL (ref 0.7–4.0)
MCH: 23.8 pg — ABNORMAL LOW (ref 26.0–34.0)
MCHC: 29.9 g/dL — ABNORMAL LOW (ref 30.0–36.0)
MCV: 79.7 fL — ABNORMAL LOW (ref 80.0–100.0)
Monocytes Absolute: 0.6 10*3/uL (ref 0.1–1.0)
Monocytes Relative: 6 %
Neutro Abs: 6.4 10*3/uL (ref 1.7–7.7)
Neutrophils Relative %: 69 %
Platelet Count: 567 10*3/uL — ABNORMAL HIGH (ref 150–400)
RBC: 4.49 MIL/uL (ref 3.87–5.11)
RDW: 15.3 % (ref 11.5–15.5)
WBC Count: 9.2 10*3/uL (ref 4.0–10.5)
nRBC: 0 % (ref 0.0–0.2)

## 2022-03-14 LAB — CMP (CANCER CENTER ONLY)
ALT: 15 U/L (ref 0–44)
AST: 16 U/L (ref 15–41)
Albumin: 4.7 g/dL (ref 3.5–5.0)
Alkaline Phosphatase: 29 U/L — ABNORMAL LOW (ref 38–126)
Anion gap: 9 (ref 5–15)
BUN: 12 mg/dL (ref 6–20)
CO2: 25 mmol/L (ref 22–32)
Calcium: 10.1 mg/dL (ref 8.9–10.3)
Chloride: 103 mmol/L (ref 98–111)
Creatinine: 1.02 mg/dL — ABNORMAL HIGH (ref 0.44–1.00)
GFR, Estimated: >60 mL/min (ref >60)
Glucose, Bld: 106 mg/dL — ABNORMAL HIGH (ref 70–99)
Potassium: 4.5 mmol/L (ref 3.5–5.1)
Sodium: 137 mmol/L (ref 135–145)
Total Bilirubin: 0.3 mg/dL (ref 0.3–1.2)
Total Protein: 7.4 g/dL (ref 6.5–8.1)

## 2022-03-14 LAB — FERRITIN: Ferritin: 4 ng/mL — ABNORMAL LOW (ref 11–307)

## 2022-03-14 LAB — IRON AND IRON BINDING CAPACITY (CC-WL,HP ONLY)
Iron: 24 ug/dL — ABNORMAL LOW (ref 28–170)
Saturation Ratios: 4 % — ABNORMAL LOW (ref 10.4–31.8)
TIBC: 679 ug/dL — ABNORMAL HIGH (ref 250–450)
UIBC: 655 ug/dL — ABNORMAL HIGH (ref 148–442)

## 2022-03-14 NOTE — Progress Notes (Unsigned)
[place]  PROGRESS NOTE  Patient Care Team: Prince Solian, MD as PCP - General (Internal Medicine)  CHIEF COMPLAINTS/PURPOSE OF CONSULTATION:  Iron deficiency anemia  TREATMENT HISTORY: -Received IV feraheme x 2 doses on 05/04/2020 and 05/11/2020 -Received IV venofer x 5 doses on 01/04/2021-02/01/2021  HISTORY OF PRESENTING ILLNESS:   [name] 42 y.o. female returns for a follow up for iron deficiency anemia. She is unaccompanied for this visit.  Patient reports that her energy levels are fairly stable. She continues to have fatigue but is able to do her ADLs. She continues to struggle with diarrhea and is trying to focus on foods that can be triggers. She reports having nausea but it is controlled with zofran that she takes as needed. She denies easy bruising or signs of active bleeding.  She denies any fevers, chills, night sweats, shortness of breath, chest pain or cough. She has no other complaints.   Rest of the pertinent 10 point ROS reviewed and negative.  REVIEW OF SYSTEMS:   Constitutional: Denies fevers, chills or abnormal night sweats Eyes: Denies blurriness of vision, double vision or watery eyes Ears, nose, mouth, throat, and face: Denies mucositis or sore throat Respiratory: Denies cough, dyspnea or wheezes Cardiovascular: Denies palpitation, chest discomfort or lower extremity swelling Gastrointestinal:  +Chronic nausea intermittently. +Diarrhea (chronic and up to 7-8 episodes per day) Skin: Denies abnormal skin rashes Lymphatics: Denies new lymphadenopathy or easy bruising Neurological:Denies numbness, tingling or new weaknesses Behavioral/Psych: Mood is stable, no new changes   All other systems were reviewed with the patient and are negative.  MEDICAL HISTORY:  Past Medical History:  Diagnosis Date   ADHD (attention deficit hyperactivity disorder)    Allergy    Anemia    Anxiety    Bipolar disorder ([place])    Chronic back pain     Chronic headaches    Chronic interstitial cystitis    "removed my bladder when I went to end stage IC"   Depression    Disc degeneration, lumbar 01/10/2022   Fibromyalgia    GERD (gastroesophageal reflux disease)    Gestational diabetes    resolved   History of blood transfusion 02/2010   "related to OR"   History of kidney stones    HLD (hyperlipidemia)    Hypothyroid    IBS (irritable colon syndrome)    IDA (iron deficiency anemia)    Interstitial cystitis 06/2008   Dr. Molli [facility]   Polycystic ovarian disease    Preeclampsia 2009   Self-catheterizes urinary bladder    "I have an [place] pouch; made a bladder using part of my small intestines; cath out of stoma in my belly button" (08/02/2015)   Small bowel obstruction ([place]) 08/03/2015   Spina bifida ([place]) 01/10/2022   Thyroid disease    Vitamin D deficiency     SURGICAL HISTORY: Past Surgical History:  Procedure Laterality Date   ABDOMINAL HYSTERECTOMY  02/2010   "left my ovaries"   APPENDECTOMY  02/2010   BLADDER REMOVAL  02/2010   and [place]  2009   CHOLECYSTECTOMY N/A 05/27/2017   Procedure: LAPAROSCOPIC CHOLECYSTECTOMY;  Surgeon: Ralene Ok, MD;  Location: [place];  Service: General;  Laterality: N/A;   COLONOSCOPY     [place] pouch  01/19/2021   Cystoscopy with bulk amid injection   LAPAROSCOPIC LYSIS OF ADHESIONS N/A 05/27/2017   Procedure: LAPAROSCOPIC LYSIS OF ADHESIONS;  Surgeon: Ralene Ok, MD;  Location: [place];  Service: General;  Laterality: N/A;   OTHER SURGICAL HISTORY     remval of urethra when bladder was removed   SMALL INTESTINE SURGERY  2017   for bowel obstruction   SPINAL CORD STIMULATOR IMPLANT  09/2009; ~ 04/2015   SPINAL CORD STIMULATOR REMOVAL  ~ 04/2015   UPPER GI ENDOSCOPY     WISDOM TOOTH EXTRACTION      SOCIAL HISTORY: Social History   Socioeconomic History   Marital status: Married    Spouse name: Not on file   Number of children: 1    Years of education: Not on file   Highest education level: Not on file  Occupational History   Occupation: Disabled  Tobacco Use   Smoking status: Never   Smokeless tobacco: Never  Vaping Use   Vaping Use: Never used  Substance and Sexual Activity   Alcohol use: Yes    Comment: 08/03/2015 "glass of wine maybe twice/month"   Drug use: No   Sexual activity: Yes    Comment: hysterectomy  Other Topics Concern   Not on file  Social History Narrative   Not on file   Social Determinants of Health   Financial Resource Strain: Not on file  Food Insecurity: Not on file  Transportation Needs: Not on file  Physical Activity: Not on file  Stress: Not on file  Social Connections: Not on file  Intimate Partner Violence: Not on file    FAMILY HISTORY: Family History  Problem Relation Age of Onset   Lung cancer Mother        smoker   Other Father        benigh tumor between heart and lungs, had surgery and something went wrong, he died at 59   Diabetes Paternal Grandmother    Heart disease Paternal Grandmother    Diabetes Paternal Grandfather    Heart disease Paternal Grandfather    Pancreatic cancer Paternal Grandfather    Liver cancer Paternal Grandfather    Breast cancer Maternal Aunt    Colon cancer Neg Hx    Esophageal cancer Neg Hx    Prostate cancer Neg Hx    Rectal cancer Neg Hx    Stomach cancer Neg Hx     ALLERGIES:  is allergic to keflex [cephalexin], penicillins, and sulfa antibiotics.  MEDICATIONS:  Current Outpatient Medications  Medication Sig Dispense Refill   amantadine (SYMMETREL) 100 MG capsule Take 100 mg by mouth 2 (two) times daily.     amphetamine-dextroamphetamine (ADDERALL) 30 MG tablet Take 30 mg by mouth 2 (two) times daily.     clonazePAM (KLONOPIN) 1 MG tablet Take 1 mg by mouth 2 (two) times daily as needed.     D3-50 1.25 MG (50000 UT) capsule Take 50,000 Units by mouth 3 (three) times a week.     dicyclomine (BENTYL) 10 MG capsule TAKE 1  CAPSULE BY MOUTH TWICE A DAY 180 capsule 1   diphenoxylate-atropine (LOMOTIL) 2.5-0.025 MG tablet Take 1 tablet by mouth in the morning, at noon, in the evening, and at bedtime. 120 tablet 2   fenofibrate 160 MG tablet Take 160 mg by mouth daily.     lamoTRIgine (LAMICTAL) 200 MG tablet 200 mg daily.     levonorgestrel-ethinyl estradiol (SEASONALE,INTROVALE,JOLESSA) 0.15-0.03 MG tablet Take 1 tablet by mouth daily.     levothyroxine (SYNTHROID, LEVOTHROID) 137 MCG tablet Take 137 mcg by mouth daily before breakfast.      lidocaine (XYLOCAINE) 5 % ointment Apply 1 application topically daily.  lithium 600 MG capsule      lumateperone tosylate (CAPLYTA) 42 MG capsule Take 1 capsule by mouth daily.     metoprolol succinate (TOPROL-XL) 100 MG 24 hr tablet Take 100 mg by mouth daily.     nitrofurantoin (MACRODANTIN) 50 MG capsule Take 50 mg by mouth daily.     omeprazole (PRILOSEC) 40 MG capsule TAKE 1 CAPSULE (40 MG TOTAL) BY MOUTH DAILY. 90 capsule 1   ondansetron (ZOFRAN) 4 MG tablet Take 2-4 mg by mouth every 8 (eight) hours as needed.     oxybutynin (DITROPAN) 5 MG/5ML syrup 5 mg 2 (two) times daily as needed. Per [place] pouch     rOPINIRole (REQUIP) 1 MG tablet Take 1 mg by mouth at bedtime.     rosuvastatin (CRESTOR) 5 MG tablet Take 1 tablet by mouth daily.     telmisartan (MICARDIS) 40 MG tablet Take 40 mg by mouth daily.     tiZANidine (ZANAFLEX) 4 MG tablet Take 8 mg by mouth 2 (two) times daily as needed.     valbenazine (INGREZZA) 80 MG capsule 80 mg daily.     No current facility-administered medications for this visit.     PHYSICAL EXAMINATION:  ECOG PERFORMANCE STATUS: 0 - Asymptomatic  Vitals:   03/14/22 1027  BP: (!) 146/71  Pulse: 81  Resp: 14  Temp: 97.7 F (36.5 C)  SpO2: 100%   Filed Weights   03/14/22 1027  Weight: 239 lb 12.8 oz (108.8 kg)    GENERAL:alert, no distress and comfortable SKIN:, .  skin color, texture, turgor are normal, no rashes or  significant lesions EYES: normal, conjunctiva are pink and non-injected, sclera clear LUNGS: clear to auscultation and percussion with normal breathing effort HEART: regular rate & rhythm and no murmurs and no lower extremity edema Musculoskeletal:no cyanosis of digits and no clubbing  PSYCH: alert & oriented x 3 with fluent speech NEURO: no focal motor/sensory deficits  LABORATORY DATA:  I have reviewed the data as listed Lab Results  Component Value Date   WBC 9.2 03/14/2022   HGB 10.7 (L) 03/14/2022   HCT 35.8 (L) 03/14/2022   MCV 79.7 (L) 03/14/2022   PLT 567 (H) 03/14/2022     Chemistry      Component Value Date/Time   NA 137 03/14/2022 0958   K 4.5 03/14/2022 0958   CL 103 03/14/2022 0958   CO2 25 03/14/2022 0958   BUN 12 03/14/2022 0958   CREATININE 1.02 (H) 03/14/2022 0958      Component Value Date/Time   CALCIUM 10.1 03/14/2022 0958   ALKPHOS 29 (L) 03/14/2022 0958   AST 16 03/14/2022 0958   ALT 15 03/14/2022 0958   BILITOT 0.3 03/14/2022 0958      RADIOGRAPHIC STUDIES: I have personally reviewed the radiological images as listed and agreed with the findings in the report. No results found.   ASSESSMENT & PLAN:  VALIA WINGARD is a 42 y.o. female who returns for follow-up for iron deficiency anemia.    #Iron deficiency anemia: --Unable to tolerate oral iron due to GI intolerance --Underwent EGD/colonoscopy on 01/20/2021. Findings suggest that gastritis could be the underlying cause of iron deficiency.  --Under the care of Dr. Havery Moros in [place] today shows no evidence of anemia and iron levels still normal. Hgb 13.7, Ferritin 81, Saturation 20%. --No need for IV iron at this time.  --RTC in 6 months with labs   All questions were answered. The patient knows to  call the clinic with any problems, questions or concerns.  I have spent a total of 25 minutes minutes of face-to-face and non-face-to-face time, preparing to see the patient,  performing  a medically appropriate examination, counseling and educating the patient,  documenting clinical information in the electronic health record, and care coordination.    Lincoln Brigham, PA-C Hematology and [place] at [facility]

## 2022-03-15 ENCOUNTER — Telehealth: Payer: Self-pay | Admitting: Pharmacy Technician

## 2022-03-15 ENCOUNTER — Encounter: Payer: Self-pay | Admitting: Physician Assistant

## 2022-03-15 ENCOUNTER — Telehealth: Payer: Self-pay | Admitting: Physician Assistant

## 2022-03-15 ENCOUNTER — Telehealth: Payer: Self-pay

## 2022-03-15 NOTE — Telephone Encounter (Signed)
-----   Message from Lincoln Brigham, PA-C sent at 03/15/2022 10:06 AM EST ----- Please notify patient that iron levels have dropped so we will arrange for IV iron.

## 2022-03-15 NOTE — Telephone Encounter (Signed)
Dr. Charlies Silvers, [place] [place] note:  Auth Submission: NO AUTH NEEDED Payer: [employer]BS Medication & CPT/J Code(s) submitted: Venofer (Iron Sucrose) [id number] Route of submission (phone, fax, portal):  Phone # Fax # Auth type: Buy/Bill Units/visits requested: 5 Reference number:  Approval from: 03/15/22 to 07/14/22   Patient will be scheduled as soon as possible.

## 2022-03-15 NOTE — Telephone Encounter (Signed)
Pt advised with VU and is aware that she will hear from [facility] scheduling after insurance approval.

## 2022-03-15 NOTE — Telephone Encounter (Signed)
Called patient to confirm new appointments. Left voicemail with appointment information.

## 2022-03-21 ENCOUNTER — Ambulatory Visit (INDEPENDENT_AMBULATORY_CARE_PROVIDER_SITE_OTHER): Payer: BC Managed Care – PPO

## 2022-03-21 VITALS — BP 137/84 | HR 66 | Temp 98.4°F | Resp 18 | Ht 66.0 in | Wt 243.0 lb

## 2022-03-21 DIAGNOSIS — E611 Iron deficiency: Secondary | ICD-10-CM | POA: Diagnosis not present

## 2022-03-21 MED ORDER — HEPARIN SOD (PORK) LOCK FLUSH 100 UNIT/ML IV SOLN: 500.0000 [IU] | Freq: Once | INTRAVENOUS | Status: DC | PRN

## 2022-03-21 MED ORDER — HEPARIN SOD (PORK) LOCK FLUSH 100 UNIT/ML IV SOLN: 250.0000 [IU] | Freq: Once | INTRAVENOUS | Status: DC | PRN

## 2022-03-21 MED ORDER — ALTEPLASE 2 MG IJ SOLR: 2.0000 mg | Freq: Once | INTRAMUSCULAR | Status: DC | PRN

## 2022-03-21 MED ORDER — SODIUM CHLORIDE 0.9% FLUSH: 3.0000 mL | Freq: Once | INTRAVENOUS | Status: DC | PRN

## 2022-03-21 MED ORDER — ANTICOAGULANT SODIUM CITRATE 4% (200MG/5ML) IV SOLN: 5.0000 mL | Freq: Once | Status: DC | PRN

## 2022-03-21 MED ORDER — SODIUM CHLORIDE 0.9% FLUSH: 10.0000 mL | Freq: Once | INTRAVENOUS | Status: DC | PRN

## 2022-03-21 MED ORDER — ACETAMINOPHEN 325 MG PO TABS: 650.0000 mg | ORAL_TABLET | Freq: Once | ORAL | Status: DC

## 2022-03-21 MED ORDER — DIPHENHYDRAMINE HCL 25 MG PO CAPS: 25.0000 mg | ORAL_CAPSULE | Freq: Once | ORAL | Status: DC

## 2022-03-21 MED ORDER — SODIUM CHLORIDE 0.9 % IV SOLN
200.0000 mg | Freq: Once | INTRAVENOUS | Status: AC
  Administered 2022-03-21: 200 mg via INTRAVENOUS
  Filled 2022-03-21: qty 10

## 2022-03-21 NOTE — Progress Notes (Signed)
Diagnosis: Iron Deficiency Anemia  Provider:  Marshell Garfinkel MD  Procedure: Infusion  IV Type: Peripheral, IV Location: L Forearm  Venofer (Iron Sucrose), Dose: 200 mg  Infusion Start Time: 1110  Infusion Stop Time: 6384  Post Infusion IV Care: Peripheral IV Discontinued  Discharge: Condition: Good, Destination: Home . AVS provided to patient.   Performed by:  Cleophus Molt, RN

## 2022-03-21 NOTE — Patient Instructions (Signed)
Iron Sucrose Injection What is this medication? IRON SUCROSE (EYE ern SOO krose) treats low levels of iron (iron deficiency anemia) in people with kidney disease. Iron is a mineral that plays an important role in making red blood cells, which carry oxygen from your lungs to the rest of your body. This medicine may be used for other purposes; ask your health care provider or pharmacist if you have questions. COMMON BRAND NAME(S): Venofer What should I tell my care team before I take this medication? They need to know if you have any of these conditions: Anemia not caused by low iron levels Heart disease High levels of iron in the blood Kidney disease Liver disease An unusual or allergic reaction to iron, other medications, foods, dyes, or preservatives Pregnant or trying to get pregnant Breastfeeding How should I use this medication? This medication is for infusion into a vein. It is given in a hospital or clinic setting. Talk to your care team about the use of this medication in children. While this medication may be prescribed for children as young as 2 years for selected conditions, precautions do apply. Overdosage: If you think you have taken too much of this medicine contact a poison control center or emergency room at once. NOTE: This medicine is only for you. Do not share this medicine with others. What if I miss a dose? Keep appointments for follow-up doses. It is important not to miss your dose. Call your care team if you are unable to keep an appointment. What may interact with this medication? Do not take this medication with any of the following: Deferoxamine Dimercaprol Other iron products This medication may also interact with the following: Chloramphenicol Deferasirox This list may not describe all possible interactions. Give your health care provider a list of all the medicines, herbs, non-prescription drugs, or dietary supplements you use. Also tell them if you smoke,  drink alcohol, or use illegal drugs. Some items may interact with your medicine. What should I watch for while using this medication? Visit your care team regularly. Tell your care team if your symptoms do not start to get better or if they get worse. You may need blood work done while you are taking this medication. You may need to follow a special diet. Talk to your care team. Foods that contain iron include: whole grains/cereals, dried fruits, beans, or peas, leafy green vegetables, and organ meats (liver, kidney). What side effects may I notice from receiving this medication? Side effects that you should report to your care team as soon as possible: Allergic reactions--skin rash, itching, hives, swelling of the face, lips, tongue, or throat Low blood pressure--dizziness, feeling faint or lightheaded, blurry vision Shortness of breath Side effects that usually do not require medical attention (report to your care team if they continue or are bothersome): Flushing Headache Joint pain Muscle pain Nausea Pain, redness, or irritation at injection site This list may not describe all possible side effects. Call your doctor for medical advice about side effects. You may report side effects to FDA at 1-800-FDA-1088. Where should I keep my medication? This medication is given in a hospital or clinic and will not be stored at home. NOTE: This sheet is a summary. It may not cover all possible information. If you have questions about this medicine, talk to your doctor, pharmacist, or health care provider.  2023 Elsevier/Gold Standard (2020-06-02 00:00:00)

## 2022-03-23 DIAGNOSIS — F3181 Bipolar II disorder: Secondary | ICD-10-CM | POA: Diagnosis not present

## 2022-03-23 DIAGNOSIS — F4312 Post-traumatic stress disorder, chronic: Secondary | ICD-10-CM | POA: Diagnosis not present

## 2022-03-23 DIAGNOSIS — F900 Attention-deficit hyperactivity disorder, predominantly inattentive type: Secondary | ICD-10-CM | POA: Diagnosis not present

## 2022-03-28 DIAGNOSIS — F900 Attention-deficit hyperactivity disorder, predominantly inattentive type: Secondary | ICD-10-CM | POA: Diagnosis not present

## 2022-03-28 DIAGNOSIS — F410 Panic disorder [episodic paroxysmal anxiety] without agoraphobia: Secondary | ICD-10-CM | POA: Diagnosis not present

## 2022-03-28 DIAGNOSIS — F3131 Bipolar disorder, current episode depressed, mild: Secondary | ICD-10-CM | POA: Diagnosis not present

## 2022-03-28 DIAGNOSIS — F3174 Bipolar disorder, in full remission, most recent episode manic: Secondary | ICD-10-CM | POA: Diagnosis not present

## 2022-03-29 ENCOUNTER — Ambulatory Visit (INDEPENDENT_AMBULATORY_CARE_PROVIDER_SITE_OTHER): Payer: BC Managed Care – PPO

## 2022-03-29 VITALS — BP 124/83 | HR 72 | Temp 98.9°F | Resp 16 | Ht 66.0 in | Wt 241.4 lb

## 2022-03-29 DIAGNOSIS — E611 Iron deficiency: Secondary | ICD-10-CM | POA: Diagnosis not present

## 2022-03-29 MED ORDER — DIPHENHYDRAMINE HCL 25 MG PO CAPS: 25.0000 mg | ORAL_CAPSULE | Freq: Once | ORAL | Status: DC

## 2022-03-29 MED ORDER — ACETAMINOPHEN 325 MG PO TABS: 650.0000 mg | ORAL_TABLET | Freq: Once | ORAL | Status: DC

## 2022-03-29 MED ORDER — SODIUM CHLORIDE 0.9 % IV SOLN
200.0000 mg | Freq: Once | INTRAVENOUS | Status: AC
  Administered 2022-03-29: 200 mg via INTRAVENOUS
  Filled 2022-03-29: qty 10

## 2022-03-29 NOTE — Progress Notes (Signed)
Diagnosis: Iron Deficiency Anemia  Provider:  Marshell Garfinkel MD  Procedure: Infusion  IV Type: Peripheral, IV Location: L Antecubital  Venofer (Iron Sucrose), Dose: 200 mg  Infusion Start Time: 3736  Infusion Stop Time: 6815  Post Infusion IV Care: Peripheral IV Discontinued  Discharge: Condition: Good, Destination: Home . AVS provided to patient.   Performed by:  Adelina Mings, LPN

## 2022-04-04 ENCOUNTER — Ambulatory Visit (INDEPENDENT_AMBULATORY_CARE_PROVIDER_SITE_OTHER): Payer: BC Managed Care – PPO

## 2022-04-04 VITALS — BP 128/83 | HR 75 | Temp 98.9°F | Resp 16 | Ht 66.0 in | Wt 239.4 lb

## 2022-04-04 DIAGNOSIS — E611 Iron deficiency: Secondary | ICD-10-CM | POA: Diagnosis not present

## 2022-04-04 MED ORDER — DIPHENHYDRAMINE HCL 25 MG PO CAPS: 25.0000 mg | ORAL_CAPSULE | Freq: Once | ORAL | Status: DC

## 2022-04-04 MED ORDER — ACETAMINOPHEN 325 MG PO TABS: 650.0000 mg | ORAL_TABLET | Freq: Once | ORAL | Status: DC

## 2022-04-04 MED ORDER — SODIUM CHLORIDE 0.9 % IV SOLN
200.0000 mg | Freq: Once | INTRAVENOUS | Status: AC
  Administered 2022-04-04: 200 mg via INTRAVENOUS
  Filled 2022-04-04: qty 10

## 2022-04-04 NOTE — Progress Notes (Signed)
Diagnosis: Iron Deficiency Anemia  Provider:  Marshell Garfinkel MD  Procedure: Infusion  IV Type: Peripheral, IV Location: L Antecubital  Venofer (Iron Sucrose), Dose: 200 mg  Infusion Start Time: 7282  Infusion Stop Time: 1109  Post Infusion IV Care: Peripheral IV Discontinued  Discharge: Condition: Good, Destination: Home . AVS provided to patient.   Performed by:  Cleophus Molt, RN

## 2022-04-05 DIAGNOSIS — G8929 Other chronic pain: Secondary | ICD-10-CM | POA: Diagnosis not present

## 2022-04-05 DIAGNOSIS — M5442 Lumbago with sciatica, left side: Secondary | ICD-10-CM | POA: Diagnosis not present

## 2022-04-05 DIAGNOSIS — M5126 Other intervertebral disc displacement, lumbar region: Secondary | ICD-10-CM | POA: Diagnosis not present

## 2022-04-05 DIAGNOSIS — M5137 Other intervertebral disc degeneration, lumbosacral region: Secondary | ICD-10-CM | POA: Diagnosis not present

## 2022-04-05 DIAGNOSIS — M5416 Radiculopathy, lumbar region: Secondary | ICD-10-CM | POA: Diagnosis not present

## 2022-04-05 DIAGNOSIS — M5441 Lumbago with sciatica, right side: Secondary | ICD-10-CM | POA: Diagnosis not present

## 2022-04-05 DIAGNOSIS — M5136 Other intervertebral disc degeneration, lumbar region: Secondary | ICD-10-CM | POA: Diagnosis not present

## 2022-04-05 DIAGNOSIS — M5116 Intervertebral disc disorders with radiculopathy, lumbar region: Secondary | ICD-10-CM | POA: Diagnosis not present

## 2022-04-05 DIAGNOSIS — M5117 Intervertebral disc disorders with radiculopathy, lumbosacral region: Secondary | ICD-10-CM | POA: Diagnosis not present

## 2022-04-06 DIAGNOSIS — F4312 Post-traumatic stress disorder, chronic: Secondary | ICD-10-CM | POA: Diagnosis not present

## 2022-04-09 ENCOUNTER — Telehealth: Payer: Self-pay

## 2022-04-09 NOTE — Telephone Encounter (Signed)
Patient was referred to Dr. Johnette Abraham at [facility] on 03-12-22. Called [phone] and spoke to [place]. She does not see that the patient has started the program.  She will send a message to the referral coordinators to follow up on the referral.They will call me back with an update

## 2022-04-11 ENCOUNTER — Ambulatory Visit (INDEPENDENT_AMBULATORY_CARE_PROVIDER_SITE_OTHER): Payer: BC Managed Care – PPO

## 2022-04-11 VITALS — BP 137/88 | HR 82 | Temp 98.7°F | Resp 20 | Ht 66.0 in | Wt 239.8 lb

## 2022-04-11 DIAGNOSIS — E611 Iron deficiency: Secondary | ICD-10-CM

## 2022-04-11 MED ORDER — ACETAMINOPHEN 325 MG PO TABS: 650.0000 mg | ORAL_TABLET | Freq: Once | ORAL | Status: DC

## 2022-04-11 MED ORDER — DIPHENHYDRAMINE HCL 25 MG PO CAPS: 25.0000 mg | ORAL_CAPSULE | Freq: Once | ORAL | Status: DC

## 2022-04-11 MED ORDER — SODIUM CHLORIDE 0.9 % IV SOLN
200.0000 mg | Freq: Once | INTRAVENOUS | Status: AC
  Administered 2022-04-11: 200 mg via INTRAVENOUS
  Filled 2022-04-11: qty 10

## 2022-04-11 NOTE — Progress Notes (Signed)
Diagnosis: Iron Deficiency Anemia  Provider:  Marshell Garfinkel MD  Procedure: Infusion  IV Type: Peripheral, IV Location: L Antecubital  Venofer (Iron Sucrose), Dose: 200 mg  Infusion Start Time: 0638  Infusion Stop Time: 1100  Post Infusion IV Care: Peripheral IV Discontinued  Discharge: Condition: Good, Destination: Home . AVS Declined  Performed by:  Adelina Mings, LPN

## 2022-04-13 NOTE — Telephone Encounter (Signed)
Patient is scheduled 05/15/22 for clinic appt.

## 2022-04-13 NOTE — Telephone Encounter (Signed)
Robin from [facility] returned call, requesting to speak to [name]. Call back number is [phone].

## 2022-04-13 NOTE — Telephone Encounter (Signed)
Left message for [name] that wer are returning her call. It does appear patient is now scheduled with Bariatric clinic for 05/15/22

## 2022-04-18 ENCOUNTER — Ambulatory Visit (INDEPENDENT_AMBULATORY_CARE_PROVIDER_SITE_OTHER): Payer: BC Managed Care – PPO

## 2022-04-18 VITALS — BP 139/90 | HR 75 | Temp 98.4°F | Resp 18 | Ht 66.0 in | Wt 238.6 lb

## 2022-04-18 DIAGNOSIS — F4312 Post-traumatic stress disorder, chronic: Secondary | ICD-10-CM | POA: Diagnosis not present

## 2022-04-18 DIAGNOSIS — E611 Iron deficiency: Secondary | ICD-10-CM

## 2022-04-18 MED ORDER — DIPHENHYDRAMINE HCL 25 MG PO CAPS: 25.0000 mg | ORAL_CAPSULE | Freq: Once | ORAL | Status: DC

## 2022-04-18 MED ORDER — SODIUM CHLORIDE 0.9 % IV SOLN
200.0000 mg | Freq: Once | INTRAVENOUS | Status: AC
  Administered 2022-04-18: 200 mg via INTRAVENOUS
  Filled 2022-04-18: qty 10

## 2022-04-18 MED ORDER — ACETAMINOPHEN 325 MG PO TABS: 650.0000 mg | ORAL_TABLET | Freq: Once | ORAL | Status: DC

## 2022-04-18 NOTE — Progress Notes (Signed)
Diagnosis: Iron Deficiency Anemia  Provider:  Marshell Garfinkel MD  Procedure: Infusion  IV Type: Peripheral, IV Location: L Antecubital  Venofer (Iron Sucrose), Dose: 200 mg  Infusion Start Time: [id number]  Infusion Stop Time: [id number]  Post Infusion IV Care: Peripheral IV Discontinued  Discharge: Condition: Good, Destination: Home . AVS Provided and AVS Declined  Performed by:  Paul Dykes, RN

## 2022-04-27 DIAGNOSIS — F4312 Post-traumatic stress disorder, chronic: Secondary | ICD-10-CM | POA: Diagnosis not present

## 2022-05-02 DIAGNOSIS — R443 Hallucinations, unspecified: Secondary | ICD-10-CM | POA: Diagnosis not present

## 2022-05-02 DIAGNOSIS — E039 Hypothyroidism, unspecified: Secondary | ICD-10-CM | POA: Diagnosis not present

## 2022-05-02 DIAGNOSIS — I10 Essential (primary) hypertension: Secondary | ICD-10-CM | POA: Diagnosis not present

## 2022-05-02 DIAGNOSIS — F319 Bipolar disorder, unspecified: Secondary | ICD-10-CM | POA: Diagnosis not present

## 2022-05-02 DIAGNOSIS — R030 Elevated blood-pressure reading, without diagnosis of hypertension: Secondary | ICD-10-CM | POA: Diagnosis not present

## 2022-05-02 DIAGNOSIS — G2401 Drug induced subacute dyskinesia: Secondary | ICD-10-CM | POA: Diagnosis not present

## 2022-05-07 ENCOUNTER — Encounter: Payer: Self-pay | Admitting: Gastroenterology

## 2022-05-10 DIAGNOSIS — F4312 Post-traumatic stress disorder, chronic: Secondary | ICD-10-CM | POA: Diagnosis not present

## 2022-05-14 DIAGNOSIS — Z5181 Encounter for therapeutic drug level monitoring: Secondary | ICD-10-CM | POA: Diagnosis not present

## 2022-05-21 DIAGNOSIS — D509 Iron deficiency anemia, unspecified: Secondary | ICD-10-CM | POA: Diagnosis not present

## 2022-05-21 DIAGNOSIS — D72829 Elevated white blood cell count, unspecified: Secondary | ICD-10-CM | POA: Diagnosis not present

## 2022-05-21 DIAGNOSIS — E1169 Type 2 diabetes mellitus with other specified complication: Secondary | ICD-10-CM | POA: Diagnosis not present

## 2022-05-21 DIAGNOSIS — E039 Hypothyroidism, unspecified: Secondary | ICD-10-CM | POA: Diagnosis not present

## 2022-05-21 DIAGNOSIS — K219 Gastro-esophageal reflux disease without esophagitis: Secondary | ICD-10-CM | POA: Diagnosis not present

## 2022-05-21 DIAGNOSIS — I10 Essential (primary) hypertension: Secondary | ICD-10-CM | POA: Diagnosis not present

## 2022-05-22 DIAGNOSIS — G2401 Drug induced subacute dyskinesia: Secondary | ICD-10-CM | POA: Diagnosis not present

## 2022-05-22 DIAGNOSIS — F410 Panic disorder [episodic paroxysmal anxiety] without agoraphobia: Secondary | ICD-10-CM | POA: Diagnosis not present

## 2022-05-22 DIAGNOSIS — F4312 Post-traumatic stress disorder, chronic: Secondary | ICD-10-CM | POA: Diagnosis not present

## 2022-05-22 DIAGNOSIS — F3174 Bipolar disorder, in full remission, most recent episode manic: Secondary | ICD-10-CM | POA: Diagnosis not present

## 2022-05-22 DIAGNOSIS — F3175 Bipolar disorder, in partial remission, most recent episode depressed: Secondary | ICD-10-CM | POA: Diagnosis not present

## 2022-06-05 DIAGNOSIS — Z1331 Encounter for screening for depression: Secondary | ICD-10-CM | POA: Diagnosis not present

## 2022-06-05 DIAGNOSIS — R413 Other amnesia: Secondary | ICD-10-CM | POA: Diagnosis not present

## 2022-06-05 DIAGNOSIS — Z1339 Encounter for screening examination for other mental health and behavioral disorders: Secondary | ICD-10-CM | POA: Diagnosis not present

## 2022-06-05 DIAGNOSIS — E1169 Type 2 diabetes mellitus with other specified complication: Secondary | ICD-10-CM | POA: Diagnosis not present

## 2022-06-05 DIAGNOSIS — G2401 Drug induced subacute dyskinesia: Secondary | ICD-10-CM | POA: Diagnosis not present

## 2022-06-05 DIAGNOSIS — I10 Essential (primary) hypertension: Secondary | ICD-10-CM | POA: Diagnosis not present

## 2022-06-05 DIAGNOSIS — Z0000 Encounter for general adult medical examination without abnormal findings: Secondary | ICD-10-CM | POA: Diagnosis not present

## 2022-06-05 DIAGNOSIS — F319 Bipolar disorder, unspecified: Secondary | ICD-10-CM | POA: Diagnosis not present

## 2022-06-06 DIAGNOSIS — R339 Retention of urine, unspecified: Secondary | ICD-10-CM | POA: Diagnosis not present

## 2022-06-08 DIAGNOSIS — F5101 Primary insomnia: Secondary | ICD-10-CM | POA: Diagnosis not present

## 2022-06-08 DIAGNOSIS — F4312 Post-traumatic stress disorder, chronic: Secondary | ICD-10-CM | POA: Diagnosis not present

## 2022-06-11 DIAGNOSIS — F5101 Primary insomnia: Secondary | ICD-10-CM | POA: Diagnosis not present

## 2022-06-11 DIAGNOSIS — F4312 Post-traumatic stress disorder, chronic: Secondary | ICD-10-CM | POA: Diagnosis not present

## 2022-06-11 IMAGING — MR MR ^MR ENTEROGRAPHY W/WO
16 of 17 series · 43 of 48 positions shown · IV contrast (gadavist)
Comparison: CT April 08, 2017

CLINICAL DATA: Chronic diarrhea and Bhebhe, iron deficiency anemia.

EXAM:
MR ABDOMEN AND PELVIS WITHOUT AND WITH CONTRAST (MR ENTEROGRAPHY)
TECHNIQUE: Multiplanar, multisequence MRI of the abdomen and pelvis was
performed both before and during bolus administration of intravenous
contrast. Negative oral contrast VoLumen was given.
CONTRAST:  10mL GADAVIST GADOBUTROL 1 MMOL/ML IV SOLN

[Series 4: bSSFP · coronal · 6.0mm · 0.88mm/px · 2 of 35 slices shown]
[im 1/35]
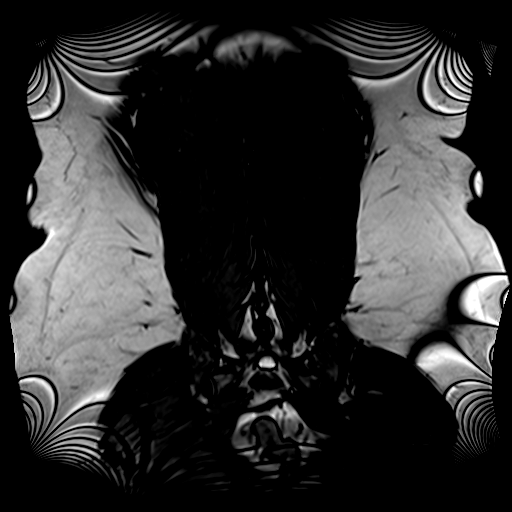
[im 35/35]
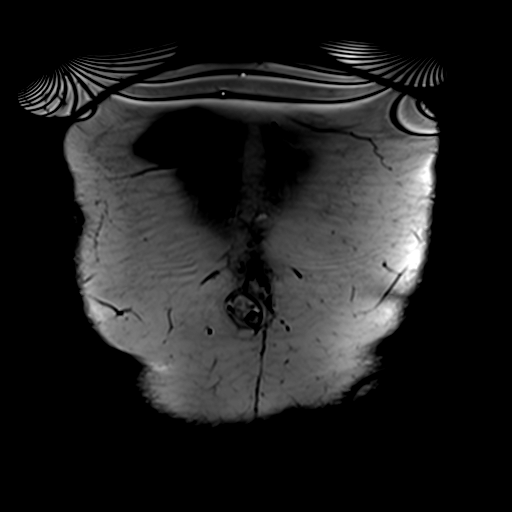

[Series 5: T2 · coronal · 6.0mm · 1.76mm/px · 2 of 36 slices shown (1 of 2)]
[im 1/36]
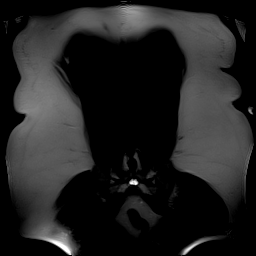
[im 36/36]
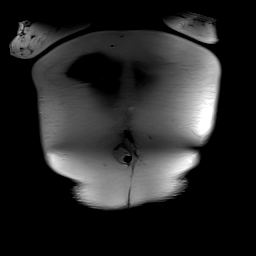

[Series 6: T2 · axial · 6.0mm · 1.56mm/px · z∈[-325,+89]mm · 3 of 60 slices shown (2 of 2)]
[im 1/60]
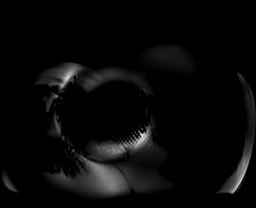
[im 30/60]
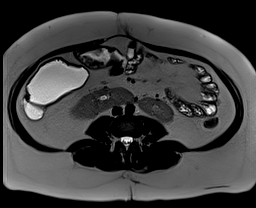
[im 60/60]
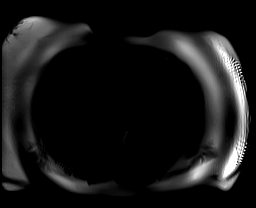

[Series 7: DWI · axial · 6.0mm · 1.49mm/px · z∈[-147,+105]mm · 3 of 72 slices shown (1 of 2)]
[im 1/72]
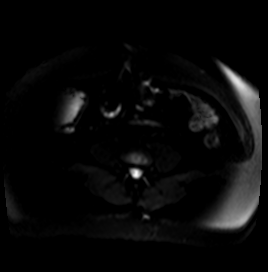
[im 36/72]
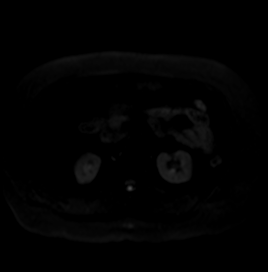
[im 72/72]
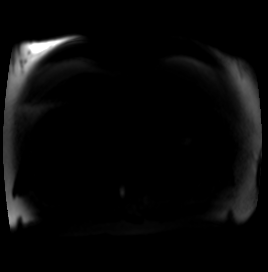

[Series 8: DWI · axial · 6.0mm · 1.49mm/px · z∈[-147,+105]mm · 2 of 36 slices shown (2 of 2)]
[im 1/36]
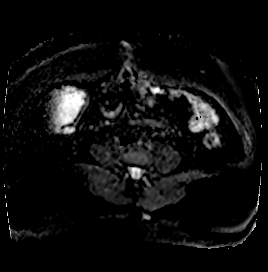
[im 36/36]
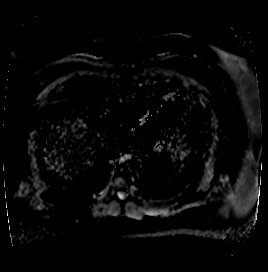

[Series 9: T1 · axial · 4.0mm · 0.88mm/px · z∈[-324,+88]mm · 4 of 104 slices shown (1 of 2)]
[im 1/104]
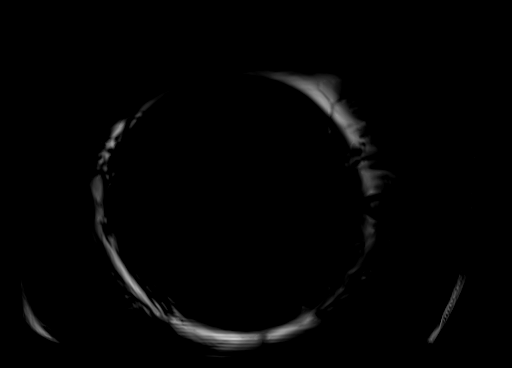
[im 35/104]
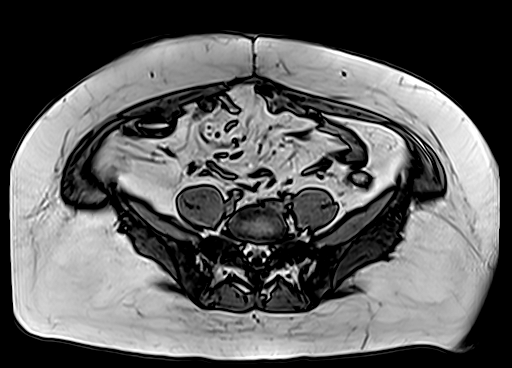
[im 69/104]
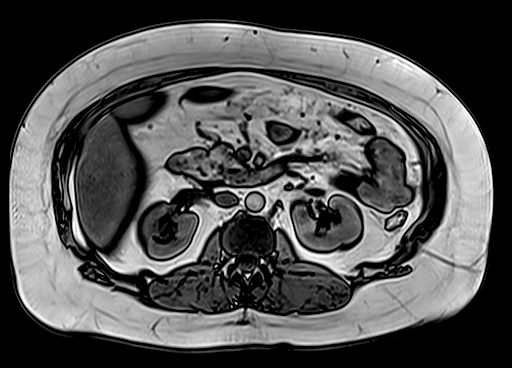
[im 104/104]
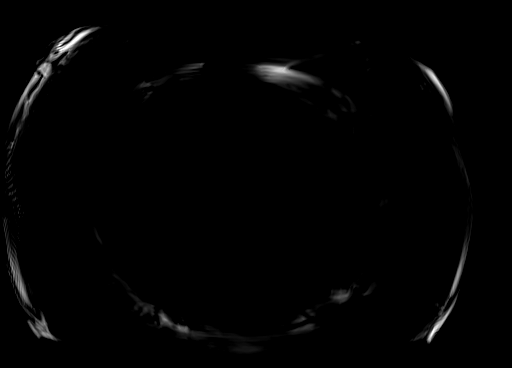

[Series 10: T1 · axial · 4.0mm · 0.88mm/px · z∈[-324,+88]mm · 3 of 104 slices shown (2 of 2)]
[im 1/104]
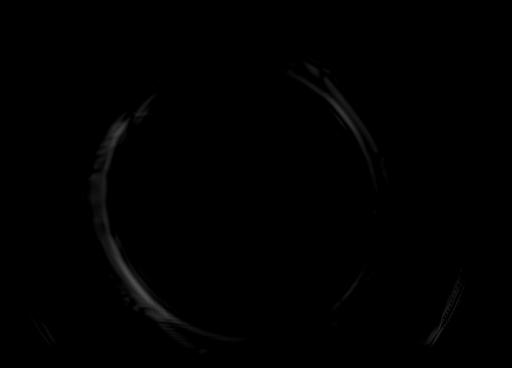
[im 52/104]
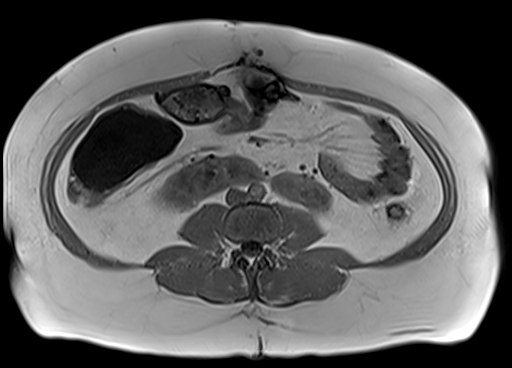
[im 104/104]
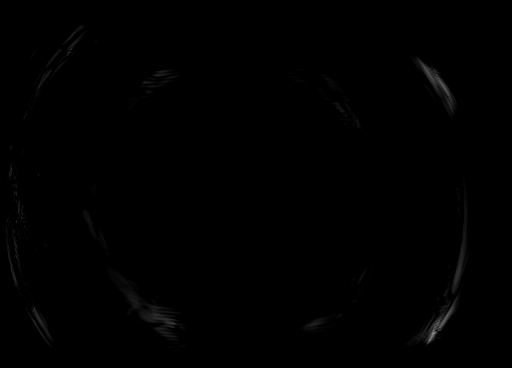

[Series 15: T1 dynamic · axial · 4.0mm · 1.41mm/px · z∈[-324,+88]mm · 3 of 104 slices shown (1 of 9)]
[im 1/104]
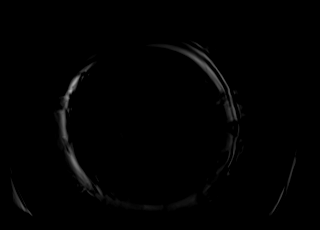
[im 52/104]
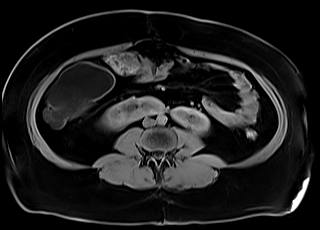
[im 104/104]
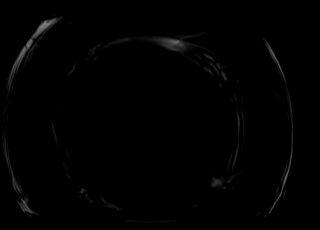

[Series 20: T1 dynamic · axial · 4.0mm · 1.41mm/px · z∈[-324,+88]mm · 3 of 104 slices shown (2 of 9)]
[im 1/104]
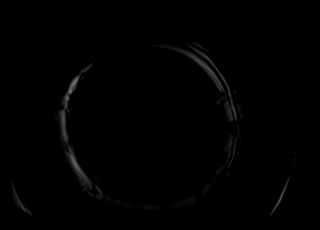
[im 52/104]
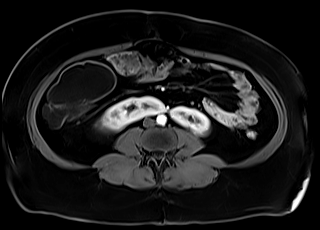
[im 104/104]
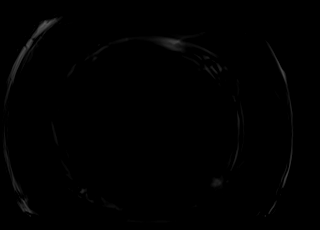

[Series 21: T1 dynamic · axial · 4.0mm · 1.41mm/px · z∈[-324,+88]mm · 3 of 104 slices shown (3 of 9)]
[im 1/104]
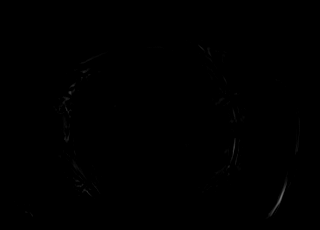
[im 52/104]
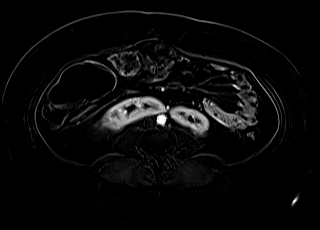
[im 104/104]
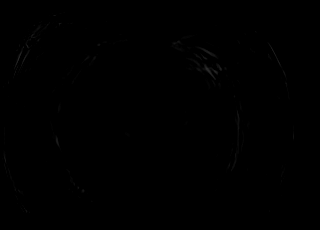

[Series 24: T1 dynamic · axial · 4.0mm · 1.41mm/px · z∈[-324,+88]mm · 3 of 104 slices shown (4 of 9)]
[im 1/104]
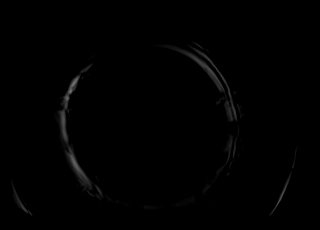
[im 52/104]
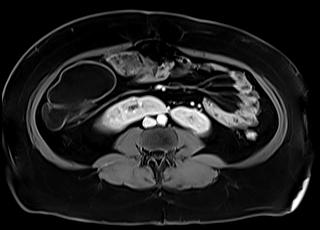
[im 104/104]
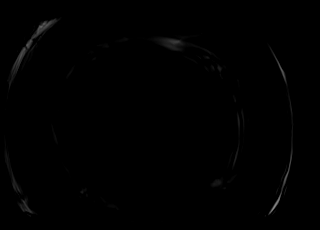

[Series 25: T1 dynamic · axial · 4.0mm · 1.41mm/px · z∈[-324,+88]mm · 3 of 104 slices shown (5 of 9)]
[im 1/104]
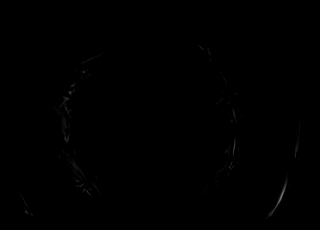
[im 52/104]
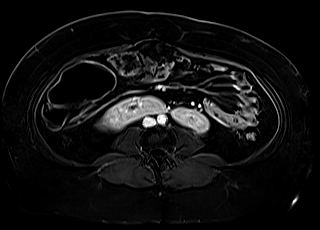
[im 104/104]
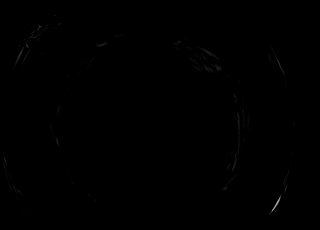

[Series 28: T1 dynamic · axial · 4.0mm · 1.41mm/px · z∈[-324,+88]mm · 3 of 104 slices shown (6 of 9)]
[im 1/104]
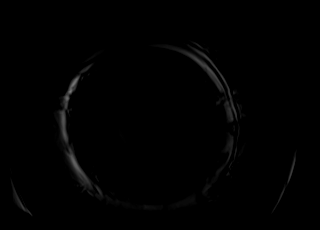
[im 52/104]
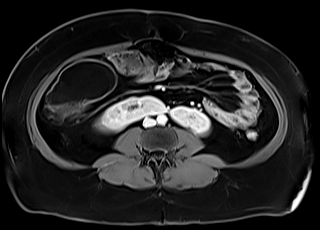
[im 104/104]
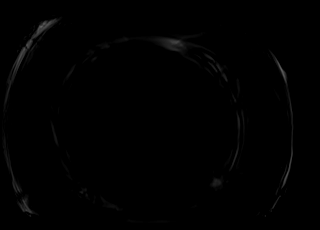

[Series 29: T1 dynamic · axial · 4.0mm · 1.41mm/px · z∈[-324,+88]mm · 3 of 104 slices shown (7 of 9)]
[im 1/104]
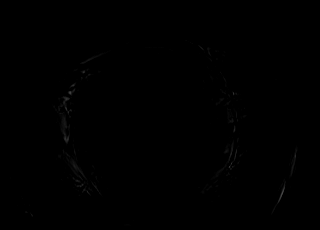
[im 52/104]
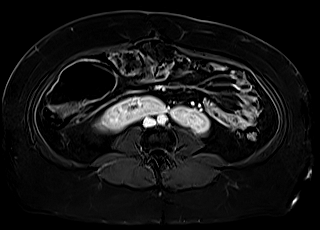
[im 104/104]
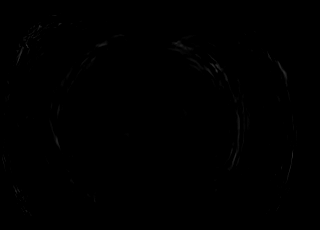

[Series 31: T1 dynamic · coronal · 4.0mm · 1.41mm/px · 2 of 60 slices shown (8 of 9)]
[im 1/60]
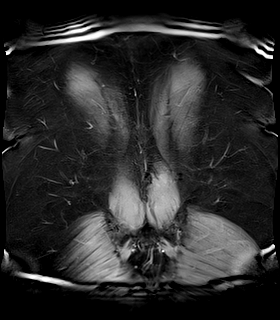
[im 60/60]
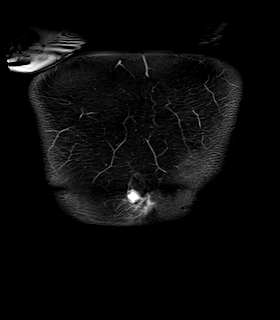

[Series 34: T1 dynamic · axial · 4.0mm · 1.41mm/px · 1 of 104 slices shown (9 of 9)]
[im 1/104]
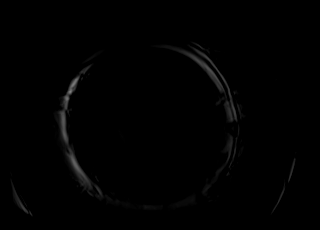

[43 of 48 positions shown; findings below may reference images not displayed]

FINDINGS: COMBINED FINDINGS FOR BOTH MR ABDOMEN AND PELVIS

Lower chest: No acute abnormality.

Hepatobiliary: Mild diffuse hepatic steatosis. No contour nodularity
or other MRI findings to suggest hepatic cirrhosis. Arterially
enhancing 14 mm segment VI hepatic focus on image [DATE] does not
demonstrate abnormal intrinsic T1 or T2 signal, no loss of signal on
out of phase imaging and the lesion equilibrates to background liver
on all more delayed phases of contrast. gallbladder surgically
absent. No biliary ductal dilation.

Pancreas: Intrinsic T1 signal of the pancreatic parenchyma is within
normal limits. Is homogeneous enhancement of the pancreas
postcontrast administration. No pancreatic ductal dilation.

Spleen:  Within normal limits in size and appearance.

Adrenals/Urinary Tract: Bilateral adrenal glands appear normal. No
hydronephrosis. Horseshoe kidney. No solid enhancing renal mass.

Prior cystectomy with [place] pouch formation with right-sided
paraumbilical cystostomy formation.

Stomach/Bowel: Stomach is moderately distended with ingested
material and gas without abnormal wall thickening. No pathologic
dilation small or large bowel. Colon is predominately decompressed
limiting evaluation. No evidence of acute or significant chronic
bowel inflammation.

Vascular/Lymphatic: The portal, splenic and superior mesenteric
veins are patent. Normal caliber abdominal aorta. No pathologically
enlarged abdominal or pelvic lymph nodes.

Reproductive: No suspicious mass or acute finding on this limited
evaluation.

Other:  No significant abdominopelvic free fluid.

Musculoskeletal: No suspicious osseous lesion.
IMPRESSION: 1. No evidence of acute or significant chronic bowel inflammation.
2. Stable postsurgical changes of prior cystectomy with [place]
pouch formation and right-sided periumbilical cystostomy formation.
3. Arterially enhancing 14 mm segment VI hepatic focus which
equilibrates to background liver on all additional postcontrast
sequences and does not demonstrate abnormal intrinsic T1 or T2
signal. No evidence of hepatic cirrhosis. Nonspecific imaging
characteristics but almost certainly reflecting a benign lesion such
as focal nodular hyperplasia or hepatic adenoma. Consider follow-up
MRI with and without EOVIST contrast in 6 for more definitive
characterization and assessment of stability.
4. Mild diffuse hepatic steatosis.
5. Horseshoe kidney.

## 2022-06-13 ENCOUNTER — Telehealth: Payer: Self-pay | Admitting: Physician Assistant

## 2022-06-13 ENCOUNTER — Inpatient Hospital Stay (HOSPITAL_BASED_OUTPATIENT_CLINIC_OR_DEPARTMENT_OTHER): Payer: BC Managed Care – PPO | Admitting: Physician Assistant

## 2022-06-13 ENCOUNTER — Other Ambulatory Visit: Payer: Self-pay

## 2022-06-13 ENCOUNTER — Other Ambulatory Visit: Payer: Self-pay | Admitting: Physician Assistant

## 2022-06-13 ENCOUNTER — Inpatient Hospital Stay: Payer: BC Managed Care – PPO | Attending: Physician Assistant

## 2022-06-13 VITALS — BP 149/74 | HR 85 | Temp 97.9°F | Resp 18 | Ht 66.0 in | Wt 237.3 lb

## 2022-06-13 DIAGNOSIS — D509 Iron deficiency anemia, unspecified: Secondary | ICD-10-CM | POA: Insufficient documentation

## 2022-06-13 DIAGNOSIS — E611 Iron deficiency: Secondary | ICD-10-CM

## 2022-06-13 LAB — CBC WITH DIFFERENTIAL (CANCER CENTER ONLY)
Abs Immature Granulocytes: 0.06 10*3/uL (ref 0.00–0.07)
Basophils Absolute: 0.1 10*3/uL (ref 0.0–0.1)
Basophils Relative: 1 %
Eosinophils Absolute: 0.2 10*3/uL (ref 0.0–0.5)
Eosinophils Relative: 2 %
HCT: 32.9 % — ABNORMAL LOW (ref 36.0–46.0)
Hemoglobin: 9.6 g/dL — ABNORMAL LOW (ref 12.0–15.0)
Immature Granulocytes: 1 %
Lymphocytes Relative: 25 %
Lymphs Abs: 2.1 10*3/uL (ref 0.7–4.0)
MCH: 23.8 pg — ABNORMAL LOW (ref 26.0–34.0)
MCHC: 29.2 g/dL — ABNORMAL LOW (ref 30.0–36.0)
MCV: 81.4 fL (ref 80.0–100.0)
Monocytes Absolute: 0.6 10*3/uL (ref 0.1–1.0)
Monocytes Relative: 7 %
Neutro Abs: 5.4 10*3/uL (ref 1.7–7.7)
Neutrophils Relative %: 64 %
Platelet Count: 575 10*3/uL — ABNORMAL HIGH (ref 150–400)
RBC: 4.04 MIL/uL (ref 3.87–5.11)
RDW: 17 % — ABNORMAL HIGH (ref 11.5–15.5)
WBC Count: 8.4 10*3/uL (ref 4.0–10.5)
nRBC: 0 % (ref 0.0–0.2)

## 2022-06-13 LAB — FERRITIN: Ferritin: 4 ng/mL — ABNORMAL LOW (ref 11–307)

## 2022-06-13 LAB — IRON AND IRON BINDING CAPACITY (CC-WL,HP ONLY)
Iron: 17 ug/dL — ABNORMAL LOW (ref 28–170)
Saturation Ratios: 3 % — ABNORMAL LOW (ref 10.4–31.8)
TIBC: 680 ug/dL — ABNORMAL HIGH (ref 250–450)
UIBC: 663 ug/dL — ABNORMAL HIGH (ref 148–442)

## 2022-06-13 NOTE — Telephone Encounter (Signed)
Reached out to schedule per 4/10 LOS, left voicemail.

## 2022-06-13 NOTE — Progress Notes (Signed)
[facility]  PROGRESS NOTE  Patient Care Team: Chilton Greathouse, MD as PCP - General (Internal Medicine)  CHIEF COMPLAINTS/PURPOSE OF CONSULTATION:  Iron deficiency anemia  TREATMENT HISTORY: -Received IV feraheme x 2 doses on 05/04/2020 and 05/11/2020 -Received IV venofer x 5 doses on 01/04/2021-02/01/2021 -Received IV venofer x 5 doses on 03/21/2022-04/18/2022.   HISTORY OF PRESENTING ILLNESS:   [name] 42 y.o. female returns for a follow up for iron deficiency anemia. She is unaccompanied for this visit.  Patient reports after her recent iron infusions, her energy levels did improve but has declined in the past 4 weeks. In addition, she is experiencing some shortness of breath with exertion and dizziness. She has chronic GI symptoms with nausea and diarrhea. She continues to have burning pain in her left abdomen that has been ongoing for some time. She is compliant with the medications prescribed by her GI team. She denies any signs of bleeding or bruising. She denies any fevers, chills, night sweats, chest pain or cough. She has no other complaints.   Rest of the pertinent 10 point ROS reviewed and negative.  REVIEW OF SYSTEMS:   Constitutional: Denies fevers, chills or abnormal night sweats Eyes: Denies blurriness of vision, double vision or watery eyes Ears, nose, mouth, throat, and face: Denies mucositis or sore throat Respiratory: Denies cough, dyspnea or wheezes Cardiovascular: Denies palpitation, chest discomfort or lower extremity swelling Gastrointestinal:  +Chronic nausea intermittently. +Diarrhea Skin: Denies abnormal skin rashes Lymphatics: Denies new lymphadenopathy or easy bruising Neurological:Denies numbness, tingling or new weaknesses Behavioral/Psych: Mood is stable, no new changes   All other systems were reviewed with the patient and are negative.  MEDICAL HISTORY:  Past Medical History:  Diagnosis Date   ADHD (attention deficit  hyperactivity disorder)    Allergy    Anemia    Anxiety    Bipolar disorder (HCC)    Chronic back pain    Chronic headaches    Chronic interstitial cystitis    "removed my bladder when I went to end stage IC"   Depression    Disc degeneration, lumbar 01/10/2022   Fibromyalgia    GERD (gastroesophageal reflux disease)    Gestational diabetes    resolved   History of blood transfusion 02/2010   "related to OR"   History of kidney stones    HLD (hyperlipidemia)    Hypothyroid    IBS (irritable colon syndrome)    IDA (iron deficiency anemia)    Interstitial cystitis 06/2008   Dr. Andrey Spearman [facility]   Polycystic ovarian disease    Preeclampsia 2009   Self-catheterizes urinary bladder    "I have an [place] pouch; made a bladder using part of my small intestines; cath out of stoma in my belly button" (08/02/2015)   Small bowel obstruction (HCC) 08/03/2015   Spina bifida (HCC) 01/10/2022   Thyroid disease    Vitamin D deficiency     SURGICAL HISTORY: Past Surgical History:  Procedure Laterality Date   ABDOMINAL HYSTERECTOMY  02/2010   "left my ovaries"   APPENDECTOMY  02/2010   BLADDER REMOVAL  02/2010   and [place] Pouch   CESAREAN SECTION  2009   CHOLECYSTECTOMY N/A 05/27/2017   Procedure: LAPAROSCOPIC CHOLECYSTECTOMY;  Surgeon: Axel Filler, MD;  Location[facility] OR;  Service: General;  Laterality: N/A;   COLONOSCOPY     [place] pouch  01/19/2021   Cystoscopy with bulk amid injection   LAPAROSCOPIC LYSIS OF ADHESIONS N/A 05/27/2017   Procedure:  LAPAROSCOPIC LYSIS OF ADHESIONS;  Surgeon: Axel Filler, MD;  Location: [facility] OR;  Service: General;  Laterality: N/A;   OTHER SURGICAL HISTORY     remval of urethra when bladder was removed   SMALL INTESTINE SURGERY  2017   for bowel obstruction   SPINAL CORD STIMULATOR IMPLANT  09/2009; ~ 04/2015   SPINAL CORD STIMULATOR REMOVAL  ~ 04/2015   UPPER GI ENDOSCOPY     WISDOM TOOTH EXTRACTION      SOCIAL HISTORY: Social History    Socioeconomic History   Marital status: Married    Spouse name: Not on file   Number of children: 1   Years of education: Not on file   Highest education level: Not on file  Occupational History   Occupation: Disabled  Tobacco Use   Smoking status: Never   Smokeless tobacco: Never  Vaping Use   Vaping Use: Never used  Substance and Sexual Activity   Alcohol use: Yes    Comment: 08/03/2015 "glass of wine maybe twice/month"   Drug use: No   Sexual activity: Yes    Comment: hysterectomy  Other Topics Concern   Not on file  Social History Narrative   Not on file   Social Determinants of Health   Financial Resource Strain: Not on file  Food Insecurity: Not on file  Transportation Needs: Not on file  Physical Activity: Not on file  Stress: Not on file  Social Connections: Not on file  Intimate Partner Violence: Not on file    FAMILY HISTORY: Family History  Problem Relation Age of Onset   Lung cancer Mother        smoker   Other Father        benigh tumor between heart and lungs, had surgery and something went wrong, he died at 77   Diabetes Paternal Grandmother    Heart disease Paternal Grandmother    Diabetes Paternal Grandfather    Heart disease Paternal Grandfather    Pancreatic cancer Paternal Grandfather    Liver cancer Paternal Grandfather    Breast cancer Maternal Aunt    Colon cancer Neg Hx    Esophageal cancer Neg Hx    Prostate cancer Neg Hx    Rectal cancer Neg Hx    Stomach cancer Neg Hx     ALLERGIES:  is allergic to keflex [cephalexin], penicillins, and sulfa antibiotics.  MEDICATIONS:  Current Outpatient Medications  Medication Sig Dispense Refill   amantadine (SYMMETREL) 100 MG capsule Take 100 mg by mouth 2 (two) times daily.     amphetamine-dextroamphetamine (ADDERALL) 30 MG tablet Take 30 mg by mouth 2 (two) times daily.     clonazePAM (KLONOPIN) 1 MG tablet Take 1 mg by mouth 2 (two) times daily as needed.     D3-50 1.25 MG (50000 UT)  capsule Take 50,000 Units by mouth 3 (three) times a week.     dicyclomine (BENTYL) 10 MG capsule TAKE 1 CAPSULE BY MOUTH TWICE A DAY 180 capsule 1   diphenoxylate-atropine (LOMOTIL) 2.5-0.025 MG tablet Take 1 tablet by mouth in the morning, at noon, in the evening, and at bedtime. 120 tablet 2   fenofibrate 160 MG tablet Take 160 mg by mouth daily.     Icosapent Ethyl (VASCEPA PO) Take by mouth. 4 capsules a day     lamoTRIgine (LAMICTAL) 200 MG tablet 200 mg daily.     levonorgestrel-ethinyl estradiol (SEASONALE,INTROVALE,JOLESSA) 0.15-0.03 MG tablet Take 1 tablet by mouth daily.     levothyroxine (  SYNTHROID, LEVOTHROID) 137 MCG tablet Take 137 mcg by mouth daily before breakfast.      lidocaine (XYLOCAINE) 5 % ointment Apply 1 application topically daily.     lithium 600 MG capsule      lumateperone tosylate (CAPLYTA) 42 MG capsule Take 1 capsule by mouth daily.     metoprolol succinate (TOPROL-XL) 100 MG 24 hr tablet Take 100 mg by mouth daily.     nitrofurantoin (MACRODANTIN) 50 MG capsule Take 50 mg by mouth daily.     omeprazole (PRILOSEC) 40 MG capsule TAKE 1 CAPSULE (40 MG TOTAL) BY MOUTH DAILY. 90 capsule 1   ondansetron (ZOFRAN) 4 MG tablet Take 2-4 mg by mouth every 8 (eight) hours as needed.     oxybutynin (DITROPAN) 5 MG/5ML syrup 5 mg 2 (two) times daily as needed. Per [place] pouch     rOPINIRole (REQUIP) 1 MG tablet Take 1 mg by mouth at bedtime.     rosuvastatin (CRESTOR) 5 MG tablet Take 1 tablet by mouth daily.     telmisartan (MICARDIS) 40 MG tablet Take 40 mg by mouth daily.     tiZANidine (ZANAFLEX) 4 MG tablet Take 8 mg by mouth 2 (two) times daily as needed.     valbenazine (INGREZZA) 80 MG capsule 80 mg daily.     No current facility-administered medications for this visit.     PHYSICAL EXAMINATION:  ECOG PERFORMANCE STATUS: 0 - Asymptomatic  Vitals:   06/13/22 1004  BP: (!) 149/74  Pulse: 85  Resp: 18  Temp: 97.9 F (36.6 C)  SpO2: 100%   Filed  Weights   06/13/22 1004  Weight: 237 lb 4.8 oz (107.6 kg)    GENERAL:alert, no distress and comfortable SKIN:, .  skin color, texture, turgor are normal, no rashes or significant lesions EYES: normal, conjunctiva are pink and non-injected, sclera clear LUNGS: clear to auscultation and percussion with normal breathing effort HEART: regular rate & rhythm and no murmurs and no lower extremity edema Musculoskeletal:no cyanosis of digits and no clubbing  PSYCH: alert & oriented x 3 with fluent speech NEURO: no focal motor/sensory deficits  LABORATORY DATA:  I have reviewed the data as listed Lab Results  Component Value Date   WBC 8.4 06/13/2022   HGB 9.6 (L) 06/13/2022   HCT 32.9 (L) 06/13/2022   MCV 81.4 06/13/2022   PLT 575 (H) 06/13/2022     Chemistry      Component Value Date/Time   NA 137 03/14/2022 0958   K 4.5 03/14/2022 0958   CL 103 03/14/2022 0958   CO2 25 03/14/2022 0958   BUN 12 03/14/2022 0958   CREATININE 1.02 (H) 03/14/2022 0958      Component Value Date/Time   CALCIUM 10.1 03/14/2022 0958   ALKPHOS 29 (L) 03/14/2022 0958   AST 16 03/14/2022 0958   ALT 15 03/14/2022 0958   BILITOT 0.3 03/14/2022 0958      RADIOGRAPHIC STUDIES: I have personally reviewed the radiological images as listed and agreed with the findings in the report. No results found.   ASSESSMENT & PLAN:  [name] is a 42 y.o. female who returns for follow-up for iron deficiency anemia.    #Iron deficiency anemia: --Unable to tolerate oral iron due to GI intolerance --Underwent EGD/colonoscopy on 01/20/2021. Findings suggest that gastritis could be the underlying cause of iron deficiency.  --Under the care of Dr. Adela Lank in GI --Received received IV venofer 200 mg once a week x 5 doses  from 03/21/2022-04/18/2022.  --Labs today shows anemia with Hgb 9.6, MCV 81.4, Plt 575. Iron panel shows deficiency with serum iron 17, TIBC 680, saturation 3%, ferritin 4. --Recommend another  round of IV iron to help bolster iron and Hgb levels.  We will request a follow up with GI to address persistent iron deficiency anemia and rule out acute GI process. --RTC in 6 weeks with labs and 12 weeks with labs/follow up  All questions were answered. The patient knows to call the clinic with any problems, questions or concerns.  I have spent a total of 30 minutes minutes of face-to-face and non-face-to-face time, preparing to see the patient,  performing a medically appropriate examination, counseling and educating the patient, ordering meds,  documenting clinical information in the electronic health record, and care coordination.    Briant CedarIrene T Quillan Whitter, PA-C Hematology and Oncology [facility]

## 2022-06-14 ENCOUNTER — Encounter: Payer: Self-pay | Admitting: Gastroenterology

## 2022-06-14 ENCOUNTER — Telehealth: Payer: Self-pay

## 2022-06-14 NOTE — Telephone Encounter (Signed)
Dr. Adela Lank, pt is scheduled for your next available appt. We do not have anything sooner and APPs are booked out into May and these are the limited triage appts.

## 2022-06-14 NOTE — Telephone Encounter (Signed)
Pt advised via MyChart message. °

## 2022-06-14 NOTE — Telephone Encounter (Signed)
We were requested to see patient by:  ASSESSMENT & PLAN:  [name] is a 42 y.o. female who returns for follow-up for iron deficiency anemia.     #Iron deficiency anemia: --Unable to tolerate oral iron due to GI intolerance --Underwent EGD/colonoscopy on 01/20/2021. Findings suggest that gastritis could be the underlying cause of iron deficiency.  --Under the care of Dr. Adela Lank in GI --Received received IV venofer 200 mg once a week x 5 doses from 03/21/2022-04/18/2022.  --Labs today shows anemia with Hgb 9.6, MCV 81.4, Plt 575. Iron panel shows deficiency with serum iron 17, TIBC 680, saturation 3%, ferritin 4. --Recommend another round of IV iron to help bolster iron and Hgb levels.  We will request a follow up with GI to address persistent iron deficiency anemia and rule out acute GI process. --RTC in 6 weeks with labs and 12 weeks with labs/follow up   All questions were answered. The patient knows to call the clinic with any problems, questions or concerns.   I have spent a total of 30 minutes minutes of face-to-face and non-face-to-face time, preparing to see the patient,  performing a medically appropriate examination, counseling and educating the patient, ordering meds,  documenting clinical information in the electronic health record, and care coordination.      Briant Cedar, PA-C Hematology and Oncology [facility] patient. She has appointment in July. Rescheduled her for June, earliest available w/ Dr. Adela Lank.

## 2022-06-14 NOTE — Telephone Encounter (Signed)
-----   Message from Briant Cedar, PA-C sent at 06/13/2022  8:02 PM EDT ----- Please notify patient that her iron levels are low so we will arrange for another round of IV venofer x 5 doses.

## 2022-06-14 NOTE — Telephone Encounter (Signed)
-----   Message from Benancio Deeds, MD sent at 06/13/2022  7:38 PM EDT ----- Thanks for seeing her, we will coordinate follow up with me.  [name] can you please help coordinate a follow up visit with me in the office? Thanks  ----- Message ----- From: Raymondo Band Sent: 06/13/2022   3:20 PM EDT To: Benancio Deeds, MD  Dr. Adela Lank,   I saw Ms. Lana Fish

## 2022-06-18 DIAGNOSIS — F3181 Bipolar II disorder: Secondary | ICD-10-CM | POA: Diagnosis not present

## 2022-06-18 DIAGNOSIS — F4312 Post-traumatic stress disorder, chronic: Secondary | ICD-10-CM | POA: Diagnosis not present

## 2022-06-18 DIAGNOSIS — F5101 Primary insomnia: Secondary | ICD-10-CM | POA: Diagnosis not present

## 2022-06-21 ENCOUNTER — Ambulatory Visit (INDEPENDENT_AMBULATORY_CARE_PROVIDER_SITE_OTHER): Payer: BC Managed Care – PPO | Admitting: Gastroenterology

## 2022-06-21 ENCOUNTER — Encounter: Payer: Self-pay | Admitting: Gastroenterology

## 2022-06-21 VITALS — BP 126/80 | HR 72 | Ht 65.0 in | Wt 236.1 lb

## 2022-06-21 DIAGNOSIS — R319 Hematuria, unspecified: Secondary | ICD-10-CM

## 2022-06-21 DIAGNOSIS — K529 Noninfective gastroenteritis and colitis, unspecified: Secondary | ICD-10-CM | POA: Diagnosis not present

## 2022-06-21 DIAGNOSIS — D509 Iron deficiency anemia, unspecified: Secondary | ICD-10-CM

## 2022-06-21 DIAGNOSIS — K2970 Gastritis, unspecified, without bleeding: Secondary | ICD-10-CM

## 2022-06-21 DIAGNOSIS — R1013 Epigastric pain: Secondary | ICD-10-CM | POA: Diagnosis not present

## 2022-06-21 MED ORDER — SUCRALFATE 1 G PO TABS: 1.0000 g | ORAL_TABLET | Freq: Four times a day (QID) | ORAL | 1 refills | Status: DC | PRN

## 2022-06-21 MED ORDER — OMEPRAZOLE 40 MG PO CPDR
40.0000 mg | DELAYED_RELEASE_CAPSULE | Freq: Two times a day (BID) | ORAL | 1 refills | Status: DC
Start: 2022-06-21 — End: 2022-09-03

## 2022-06-21 NOTE — Progress Notes (Signed)
HPI :  42 year old female here for follow-up visit for iron deficiency anemia.  Recall I have also followed her for chronic diarrhea.  Recall she saw me for history of iron deficiency anemia for which she went an EGD and colonoscopy with me in 2019 as outlined below without clear cause.  She has a complicated medical history to include bipolar disorder, chronic abdominal pain, pelvic pain, fibromyalgia, hx of IC s/p total cystectomy with [place] pouch. She is s/p hysterectomy, bowel resection when she had a complication from her [place] pouch.  Denies history of bowel obstruction in the past.  She was on chronic narcotics for several years for chronic pain, has had bilateral celiac plexus blocks in June 2016 and had a neurostimulator in place, which has since been removed. She had eventually come off chronic narcotics in August 2021.  Around that time is when her bowels changes she started having loose stools.    She has had an extensive workup as outlined in prior notes for her bowels.  Her workup has been negative.  She has been tried on a variety of regimens for her symptoms to include Flagyl, rifaximin, Colestid, cholestyramine, Imodium, Lomotil, dicyclomine, alosetron.  She has an total undergone 2 EGDs and 2 colonoscopies and an MR E for her workup for IDA and symptoms in the past.  Last EGD and colonoscopy was done in November 2022 without clear cause.  MRE was unremarkable for any bowel pathology.  Recall as part of her chronic pain regimen she has been on Elavil, Cymbalta, Lyrica, gabapentin in the past none of which have helped.  She has fortunately been off all narcotics but previously was using NSAIDs and Aleve the last time I saw her.  I counseled her on that and her iron deficiency and she has been off all NSAIDs since have last seen her.  She was referred back from hematology since her last visit, for recurrent iron deficiency anemia.  Ferritin less than 5 and hemoglobin dropped in the  nines.  She is now receiving IV iron again.  She is not having any menstrual cycles.  She denies any blood in her stools at all.  She has chronic loose stools which she has had for some time.  She typically will have 6-8 loose stools a day if she does not take anything, she will use Lomotil to slow her stools down which has worked better than other regimens in the past.  Again avoiding all NSAIDs.  She does take Prilosec 40 mg daily.  She does have some occasional epigastric burning that bothers her.  She has nausea but does not vomit.  Using Zofran does help her nausea.  She does have an [place] pouch for her bladder.  She states she does see some blood in her urine periodically but was told it was not enough to cause iron deficiency.  She is seeing urology next week for reassessment.  We discussed how she wants to proceed in light of her recurrent iron deficiency yet fairly thorough workup for this in the past.  She did have some mild gastritis noted on the last EGD as well as some esophagitis.      Prior workup: EGD 01/15/2017 - LA grade A esophagitis, inflammatory nodule at GEJ, normal esophagus otherwise, normal stomach, normal small bowel.    Colonoscopy January 2019 showed the following:   - Patent end-to-end ileo-colonic anastomosis, characterized by healthy appearing mucosa. - The examined portion of the ileum was normal. -  One 3 mm polyp in the transverse colon, removed with a cold biopsy forceps. Resected and retrieved. - The examination was otherwise normal on direct and retroflexion views.   Breath test indeterminate TTG negative Fecal elastase Fecal calprotectin 43 C diff negative   EGD 01/20/21 -  - LA Grade A esophagitis was found 40 cm from the incisors. There was some nodular tissue at the GEJ in this area, suspect inflammatory changes. Biopsies were taken with a cold forceps for histology. - The exam of the esophagus was otherwise normal. - Patchy mild inflammation  characterized by adherent blood and friability was found in the gastric antrum. - The exam of the stomach was otherwise normal. - Biopsies were taken with a cold forceps in the gastric body, at the incisura and in the gastric antrum for Helicobacter pylori testing. - The duodenal bulb and second portion of the duodenum were normal. Biopsies for histology were taken with a cold forceps for evaluation of celiac disease.    Colonoscopy 01/20/21 - The perianal and digital rectal examinations were normal. - The terminal ileum appeared normal. - There was evidence of a prior end-to-side ileo-colonic anastomosis in the ascending colon. This was patent and was characterized by healthy appearing mucosa. - A few medium-mouthed diverticula were found in the right colon. - A 3 mm polyp was found in the transverse colon. The polyp was sessile. The polyp was removed with a cold biopsy forceps. Resection and retrieval were complete. I thought a photo of it was taken, but it was not. It was benign appearing. - Internal hemorrhoids were found during retroflexion. - The exam was otherwise without abnormality. - Biopsies for histology were taken with a cold forceps from the right colon, left colon and transverse colon for evaluation of microscopic colitis.   1. Surgical [P], duodenum - PEPTIC DUODENITIS - NO DYSPLASIA OR MALIGNANCY IDENTIFIED 2. Surgical [P], gastric antrum and gastric body - BENIGN GASTRIC MUCOSA WITH MILD REACTIVE CHANGES - NO H. PYLORI, INTESTINAL METAPLASIA OR MALIGNANCY IDENTIFIED 3. Surgical [P], GE junction, polyp (1) - HYPERPLASTIC GASTRIC POLYP(S) - NO H. PYLORI, INTESTINAL METAPLASIA OR MALIGNANCY IDENTIFIED 4. Surgical [P], random colon sites - BENIGN COLONIC MUCOSA - NO ACTIVE INFLAMMATION OR EVIDENCE OF MICROSCOPIC COLITIS - NO HIGH-GRADE DYSPLASIA OR MALIGNANCY IDENTIFIED 5. Surgical [P], colon, transverse, polyp (1) - TUBULAR ADENOMA (1 OF 1 FRAGMENTS) - NO  HIGH-GRADE DYSPLASIA OR MALIGNANCY IDENTIFIED     MRE 04/17/21: IMPRESSION: 1. No evidence of acute or significant chronic bowel inflammation. 2. Stable postsurgical changes of prior cystectomy with [place] pouch formation and right-sided periumbilical cystostomy formation. 3. Arterially enhancing 14 mm segment VI hepatic focus which equilibrates to background liver on all additional postcontrast sequences and does not demonstrate abnormal intrinsic T1 or T2 signal. No evidence of hepatic cirrhosis. Nonspecific imaging characteristics but almost certainly reflecting a benign lesion such as focal nodular hyperplasia or hepatic adenoma. Consider follow-up MRI with and without EOVIST contrast in 6 for more definitive characterization and assessment of stability. 4. Mild diffuse hepatic steatosis. 5. Horseshoe kidney.   MRI abdomen 09/08/21: IMPRESSION: 1. Hepatomegaly and hepatic steatosis. 2. No hepatic lesions identified. 3. Horseshoe kidney.     Lab Results  Component Value Date   WBC 8.4 06/13/2022   HGB 9.6 (L) 06/13/2022   HCT 32.9 (L) 06/13/2022   MCV 81.4 06/13/2022   PLT 575 (H) 06/13/2022    Lab Results  Component Value Date   IRON 17 (L) 06/13/2022  TIBC 680 (H) 06/13/2022   FERRITIN 4 (L) 06/13/2022       Latest Ref Rng & Units 06/13/2022    9:36 AM 03/14/2022    9:58 AM 09/13/2021   12:25 PM  CBC  WBC 4.0 - 10.5 K/uL 8.4  9.2  8.7   Hemoglobin 12.0 - 15.0 g/dL 9.6  16.1  09.6   Hematocrit 36.0 - 46.0 % 32.9  35.8  42.5   Platelets 150 - 400 K/uL 575  567  353        Past Medical History:  Diagnosis Date   ADHD (attention deficit hyperactivity disorder)    Allergy    Anemia    Anxiety    Arthritis, lumbar spine    Bipolar disorder    Chronic back pain    Chronic headaches    Chronic interstitial cystitis    "removed my bladder when I went to end stage IC"   Depression    Disc degeneration, lumbar 01/10/2022   Fibromyalgia    GERD  (gastroesophageal reflux disease)    Gestational diabetes    resolved   History of blood transfusion 02/2010   "related to OR"   History of kidney stones    HLD (hyperlipidemia)    Hypothyroid    IBS (irritable colon syndrome)    IDA (iron deficiency anemia)    Interstitial cystitis 06/2008   Dr. Andrey Spearman [facility]   Polycystic ovarian disease    Preeclampsia 2009   Self-catheterizes urinary bladder    "I have an [place] pouch; made a bladder using part of my small intestines; cath out of stoma in my belly button" (08/02/2015)   Small bowel obstruction 08/03/2015   Spina bifida 01/10/2022   Thyroid disease    Vitamin D deficiency      Past Surgical History:  Procedure Laterality Date   ABDOMINAL HYSTERECTOMY  02/2010   "left my ovaries"   APPENDECTOMY  02/2010   BLADDER REMOVAL  02/2010   and [place] Pouch   CESAREAN SECTION  2009   CHOLECYSTECTOMY N/A 05/27/2017   Procedure: LAPAROSCOPIC CHOLECYSTECTOMY;  Surgeon: Axel Filler, MD;  Location: [facility] OR;  Service: General;  Laterality: N/A;   COLONOSCOPY     [place] pouch  01/19/2021   Cystoscopy with bulk amid injection   LAPAROSCOPIC LYSIS OF ADHESIONS N/A 05/27/2017   Procedure: LAPAROSCOPIC LYSIS OF ADHESIONS;  Surgeon: Axel Filler, MD;  Location: [facility] OR;  Service: General;  Laterality: N/A;   OTHER SURGICAL HISTORY     remval of urethra when bladder was removed   SMALL INTESTINE SURGERY  2017   for bowel obstruction   SPINAL CORD STIMULATOR IMPLANT  09/2009; ~ 04/2015   SPINAL CORD STIMULATOR REMOVAL  ~ 04/2015   UPPER GI ENDOSCOPY     WISDOM TOOTH EXTRACTION     Family History  Problem Relation Age of Onset   Lung cancer Mother        smoker   Other Father        benigh tumor between heart and lungs, had surgery and something went wrong, he died at 33   Diabetes Paternal Grandmother    Heart disease Paternal Grandmother    Diabetes Paternal Grandfather    Heart disease Paternal Grandfather    Pancreatic  cancer Paternal Grandfather    Liver cancer Paternal Grandfather    Breast cancer Maternal Aunt    Colon cancer Neg Hx    Esophageal cancer Neg Hx    Prostate cancer  Neg Hx    Rectal cancer Neg Hx    Stomach cancer Neg Hx    Social History   Tobacco Use   Smoking status: Never   Smokeless tobacco: Never  Vaping Use   Vaping Use: Never used  Substance Use Topics   Alcohol use: Yes    Comment: 08/03/2015 "glass of wine maybe twice/month"   Drug use: No   Current Outpatient Medications  Medication Sig Dispense Refill   amantadine (SYMMETREL) 100 MG capsule Take 100 mg by mouth 2 (two) times daily.     amphetamine-dextroamphetamine (ADDERALL) 30 MG tablet Take 30 mg by mouth 2 (two) times daily.     clonazePAM (KLONOPIN) 1 MG tablet Take 1 mg by mouth 2 (two) times daily as needed.     D3-50 1.25 MG (50000 UT) capsule Take 50,000 Units by mouth 3 (three) times a week.     dicyclomine (BENTYL) 10 MG capsule TAKE 1 CAPSULE BY MOUTH TWICE A DAY 180 capsule 1   diphenoxylate-atropine (LOMOTIL) 2.5-0.025 MG tablet Take 1 tablet by mouth in the morning, at noon, in the evening, and at bedtime. 120 tablet 2   fenofibrate 160 MG tablet Take 160 mg by mouth daily.     Icosapent Ethyl (VASCEPA PO) Take by mouth. 4 capsules a day     lamoTRIgine (LAMICTAL) 200 MG tablet 200 mg daily.     levonorgestrel-ethinyl estradiol (SEASONALE,INTROVALE,JOLESSA) 0.15-0.03 MG tablet Take 1 tablet by mouth daily.     levothyroxine (SYNTHROID, LEVOTHROID) 137 MCG tablet Take 137 mcg by mouth daily before breakfast.      lidocaine (XYLOCAINE) 5 % ointment Apply 1 application topically daily.     lithium carbonate (ESKALITH) 450 MG ER tablet Take 700 mg by mouth daily.     lumateperone tosylate (CAPLYTA) 42 MG capsule Take 1 capsule by mouth daily.     metoprolol succinate (TOPROL-XL) 100 MG 24 hr tablet Take 100 mg by mouth daily.     nitrofurantoin (MACRODANTIN) 50 MG capsule Take 50 mg by mouth daily.      omeprazole (PRILOSEC) 40 MG capsule TAKE 1 CAPSULE (40 MG TOTAL) BY MOUTH DAILY. 90 capsule 1   ondansetron (ZOFRAN) 4 MG tablet Take 2-4 mg by mouth every 8 (eight) hours as needed.     oxybutynin (DITROPAN) 5 MG/5ML syrup 5 mg 2 (two) times daily as needed. Per [place] pouch     rOPINIRole (REQUIP) 2 MG tablet Take 2 mg by mouth at bedtime.     rosuvastatin (CRESTOR) 5 MG tablet Take 1 tablet by mouth daily.     telmisartan (MICARDIS) 40 MG tablet Take 40 mg by mouth daily.     tiZANidine (ZANAFLEX) 4 MG tablet Take 8 mg by mouth 2 (two) times daily as needed.     valbenazine (INGREZZA) 80 MG capsule 80 mg daily.     No current facility-administered medications for this visit.   Allergies  Allergen Reactions   Keflex [Cephalexin] Hives   Penicillins Hives and Other (See Comments)    PATIENT HAS HAD A PCN REACTION WITH IMMEDIATE RASH, FACIAL/TONGUE/THROAT SWELLING, SOB, OR LIGHTHEADEDNESS WITH HYPOTENSION:  #  #  #  YES  #  #  #   Has patient had a PCN reaction causing severe rash involving mucus membranes or skin necrosis: No Has patient had a PCN reaction that required hospitalization No Has patient had a PCN reaction occurring within the last 10 years: No If all of the above answers  are "NO", then may proceed with Cephalosporin use.    Sulfa Antibiotics Hives     Review of Systems: All systems reviewed and negative except where noted in HPI.   Labs per HPI  Lab Results  Component Value Date   CREATININE 1.02 (H) 03/14/2022   BUN 12 03/14/2022   NA 137 03/14/2022   K 4.5 03/14/2022   CL 103 03/14/2022   CO2 25 03/14/2022    Lab Results  Component Value Date   ALT 15 03/14/2022   AST 16 03/14/2022   ALKPHOS 29 (L) 03/14/2022   BILITOT 0.3 03/14/2022     Physical Exam: BP 126/80 (BP Location: Left Arm, Patient Position: Sitting, Cuff Size: Large)   Pulse 72   Ht  (1.651 m)   Wt 236 lb 2 oz (107.1 kg)   BMI 39.29 kg/m  Constitutional:  Pleasant,well-developed, female in no acute distress. Neurological: Alert and oriented to person place and time. Psychiatric: Normal mood and affect. Behavior is normal.   ASSESSMENT: 42 y.o. female here for assessment of the following  1. Iron deficiency anemia, unspecified iron deficiency anemia type   2. Epigastric burning sensation   3. Chronic diarrhea   4. Hematuria, unspecified type    Complex history, history of recurrent severe iron deficiency with recurrent anemia recently.  She has had 2 EGDs and 2 colonoscopies in the past for this, last done in November 2022 without clear cause.  MRE in the past did not show concerning pathology in her small intestine.  We discussed next steps to further evaluate this if she wants to given recurrence of iron deficiency.  I suspect this is coming from her small bowel somewhere given her prior evaluation.  Capsule endoscopy would be the next best test to evaluate this.  We discussed her surgical history and if she was at risk for capsule retention.  To clarify her chart, she has never had a small bowel obstruction, she had a perforated [place] pouch in the past that led to surgery in 2017.  She denies any history of bowel obstructions or bowel surgery other than that was used to create her [place] pouch.  We discussed if she wanted to do capsule endoscopy directly versus doing a patency capsule first.  Patency capsule would need to be ordered and could delay her exam by several weeks.  Following discussion of risks and benefits, she has no obstructive symptoms, while there is risk for capsule retention I think that risk is low, she wants to proceed with capsule endoscopy. We will await that result.  In the interim, given her epigastric burning we will give trial of omeprazole to 40 mg twice daily.  Will also add some Carafate tablets to use as needed to see if that helps.  She will follow-up with hematology for IV iron infusions and monitoring of her  hemoglobin.  If capsule study is negative, perhaps hematuria could be causing iron deficiency and she should discuss this with her urologist who she is seeing next week.  PLAN: - proceed with capsule endoscopy - increase omeprazole to BID dosing - add carafate tablet - 1 tab every 6 hours PRN - counseled this could make her stool dark and she should avoid taking it prior to capsule study - IV iron infusion with hematology  I spent 40 minutes of time, including in depth chart review,face-to-face time with the patient,  and documentation.   Harlin Rain, MD [facility]

## 2022-06-21 NOTE — Patient Instructions (Signed)
CAPSULE ENDOSCOPY PATIENT INSTRUCTION SHEET  [name] [birth year] [id number]   06-27-22 Seven (7) days prior to capsule endoscopy stop taking iron supplements and carafate.  4-29 Two (2) days prior to capsule endoscopy stop taking aspirin or any arthritis drugs.  4-30 Day before capsule endoscopy purchase a 238 gram bottle of Miralax from the laxative section of your drug store, and a 32 oz. bottle of Gatorade (no red).    4-30 Tuesday One (1) day prior to capsule endoscopy: Stop smoking. Eat a regular diet until 12:00 Noon. After 12:00 Noon take only the following: Black coffee  Jell-O (no fruit or red Jell-o) Water   Bouillon (chicken or beef) 7-Up   Cranberry Juice Tea   Kool-Aid Popsicle (not red) Sprite   Coke Ginger Ale  Pepsi Mountain Dew Gatorade At 6:00 pm the evening before your appointment, drink 7 capfuls (105 grams) of Miralax with 32 oz. Gatorade. Drink 8 oz every 15 minutes until gone. Nothing to eat or drink after midnight except medications with a sip of water.  Wednesday, 07-04-22,  Day of capsule endoscopy:  No medications for 2 hours prior to your test.  Please arrive at [facility]  3rd floor patient registration area by 8:30am on: 07-04-22.   For any questions: Call [facility] at [phone] and ask to speak with one of the capsule endoscopy nurses.  YOU WILL NEED TO RETURN THE EQUIPMENT AT 4 PM ON THE DAY OF THE PROCEDURE.  PLEASE KEEP THIS IN MIND WHEN SCHEDULING.    Small Bowel Capsule Endoscopy  What you should know: Small Bowel capsule endoscopy is a procedure that takes pictures of the inside of your small intestine (bowel).  Your small bowel connects to your stomach on one end, and your large bowel (colon) on the other.  A capsule endoscopy is done by swallowing a pill size camera.  The capsule moves through your stomach and into your small bowel, where pictures are taken.   You may need a small bowel capsule endoscopy if you have  symptoms, such as blood in your stool, chronic stomach pain, and diarrhea.  The pictures may show if you have growths, swelling, and bleeding area in you small bowel.  A capsule endoscopy may also show if diseases such as Crohn's or celiac disease are causing your symptoms.  Having a small bowel capsule endoscopy may help you and your caregiver learn the cause of your symptoms.  Learning what is causing your symptoms allows you to receive needed treatment and prevent further problems. Risks: You may have stomach pain during your procedure.   The pictures taken by the capsule may not be clear.   The pictures may not show the cause of your symptoms.   You may need another endoscopy procedure.   The capsule may get trapped in your esophagus or intestines. You may need surgery or additional procedures to remove the capsule from your body.  Before your procedure: You will be instructed to stop certain prescription medications or over- the -counter medications prior to the procedure.   The day before your scheduled appointment you will need to be on a restricted diet and will need to drink a bowel prep that will clean out your bowels.   The day of the procedure: You may drive yourself to the procedure.   You will need to plan on 2 trips to the office on the day of the procedure. Morning: Plan to be at the office about 45 minutes. The  morning of the procedure a sensor belt and recorder will be placed on you.  You will wear this for 8 hours.  (The sensor belt transfers pictures of your small bowel to the recorder.)   You will be given a pill-sized capsule endoscope to swallow.  Once you swallow the capsule it will travel through your body the same way food does, constantly taking pictures along the way.  The capsule takes 2-3 pictures a second.   Once you have left the office you may go about your normal day with a few exceptions: You may not go near a MRI machine or a radio or television towers; You need  to avoid other patients having capsule endoscopy; You will be given a written diet to follow for the day.  Afternoon: You will need to be return to the office at your designated time. The sensors belt will be removed You will need to be at the office about 15 minutes.    ________________________________________________  Increase omeprazole to twice a day  We have sent the following medications to your pharmacy for you to pick up at your convenience: Carafate tablets: Take one every 6 hours as needed  Thank you for entrusting me with your care and for choosing [facility], Dr. Ileene Patrick

## 2022-06-22 ENCOUNTER — Ambulatory Visit (INDEPENDENT_AMBULATORY_CARE_PROVIDER_SITE_OTHER): Payer: BC Managed Care – PPO

## 2022-06-22 VITALS — BP 126/87 | HR 72 | Temp 98.7°F | Resp 16 | Ht 66.0 in | Wt 237.0 lb

## 2022-06-22 DIAGNOSIS — E611 Iron deficiency: Secondary | ICD-10-CM | POA: Diagnosis not present

## 2022-06-22 MED ORDER — SODIUM CHLORIDE 0.9 % IV SOLN
200.0000 mg | Freq: Once | INTRAVENOUS | Status: AC
  Administered 2022-06-22: 200 mg via INTRAVENOUS
  Filled 2022-06-22: qty 10

## 2022-06-22 NOTE — Progress Notes (Signed)
Diagnosis: Iron Deficiency Anemia  Provider:  Chilton Greathouse MD  Procedure: Infusion  IV Type: Peripheral, IV Location: L Antecubital  Venofer (Iron Sucrose), Dose: 200 mg  Infusion Start Time: 1324  Infusion Stop Time: 1344  Post Infusion IV Care: Patient declined observation and Peripheral IV Discontinued  Discharge: Condition: Stable, Destination: Home . AVS Provided  Performed by:  Wyvonne Lenz, RN

## 2022-06-25 DIAGNOSIS — N390 Urinary tract infection, site not specified: Secondary | ICD-10-CM | POA: Diagnosis not present

## 2022-06-25 DIAGNOSIS — Z906 Acquired absence of other parts of urinary tract: Secondary | ICD-10-CM | POA: Diagnosis not present

## 2022-06-25 DIAGNOSIS — R102 Pelvic and perineal pain: Secondary | ICD-10-CM | POA: Diagnosis not present

## 2022-06-25 DIAGNOSIS — Z9889 Other specified postprocedural states: Secondary | ICD-10-CM | POA: Diagnosis not present

## 2022-06-26 DIAGNOSIS — F4312 Post-traumatic stress disorder, chronic: Secondary | ICD-10-CM | POA: Diagnosis not present

## 2022-06-26 DIAGNOSIS — F3181 Bipolar II disorder: Secondary | ICD-10-CM | POA: Diagnosis not present

## 2022-06-26 DIAGNOSIS — F5101 Primary insomnia: Secondary | ICD-10-CM | POA: Diagnosis not present

## 2022-06-28 DIAGNOSIS — F3175 Bipolar disorder, in partial remission, most recent episode depressed: Secondary | ICD-10-CM | POA: Diagnosis not present

## 2022-06-28 DIAGNOSIS — F900 Attention-deficit hyperactivity disorder, predominantly inattentive type: Secondary | ICD-10-CM | POA: Diagnosis not present

## 2022-06-28 DIAGNOSIS — F3173 Bipolar disorder, in partial remission, most recent episode manic: Secondary | ICD-10-CM | POA: Diagnosis not present

## 2022-06-28 DIAGNOSIS — F410 Panic disorder [episodic paroxysmal anxiety] without agoraphobia: Secondary | ICD-10-CM | POA: Diagnosis not present

## 2022-06-29 ENCOUNTER — Ambulatory Visit (INDEPENDENT_AMBULATORY_CARE_PROVIDER_SITE_OTHER): Payer: BC Managed Care – PPO | Admitting: *Deleted

## 2022-06-29 ENCOUNTER — Other Ambulatory Visit: Payer: Self-pay | Admitting: Gastroenterology

## 2022-06-29 VITALS — BP 137/84 | HR 69 | Temp 98.3°F | Resp 16 | Ht 66.0 in | Wt 237.2 lb

## 2022-06-29 DIAGNOSIS — E611 Iron deficiency: Secondary | ICD-10-CM

## 2022-06-29 MED ORDER — SODIUM CHLORIDE 0.9 % IV SOLN
200.0000 mg | Freq: Once | INTRAVENOUS | Status: AC
  Administered 2022-06-29: 200 mg via INTRAVENOUS
  Filled 2022-06-29: qty 10

## 2022-06-29 NOTE — Telephone Encounter (Signed)
Yes okay to refill if she is using this and helping. Thanks

## 2022-06-29 NOTE — Telephone Encounter (Signed)
I called and spoke with Judeth Cornfield and she is using the Lomotil prn however she doesn't need it now. The pharmacy just sent [place] the request.

## 2022-06-29 NOTE — Telephone Encounter (Signed)
Patient seen 06/21/2022, please refill if approved. Thank you.

## 2022-06-29 NOTE — Progress Notes (Signed)
Diagnosis: Iron Deficiency Anemia  Provider:  Chilton Greathouse MD  Procedure: IV Infusion  IV Type: Peripheral, IV Location: L Antecubital  Venofer (Iron Sucrose), Dose: 200 mg  Infusion Start Time: 1020 am  Infusion Stop Time: 1040 am  Post Infusion IV Care: Observation period completed and Peripheral IV Discontinued  Discharge: Condition: Good, Destination: Home . AVS Provided  Performed by:  Forrest Moron, RN

## 2022-07-02 DIAGNOSIS — F5101 Primary insomnia: Secondary | ICD-10-CM | POA: Diagnosis not present

## 2022-07-02 DIAGNOSIS — F4312 Post-traumatic stress disorder, chronic: Secondary | ICD-10-CM | POA: Diagnosis not present

## 2022-07-02 DIAGNOSIS — F410 Panic disorder [episodic paroxysmal anxiety] without agoraphobia: Secondary | ICD-10-CM | POA: Diagnosis not present

## 2022-07-02 DIAGNOSIS — F3181 Bipolar II disorder: Secondary | ICD-10-CM | POA: Diagnosis not present

## 2022-07-03 DIAGNOSIS — Z01419 Encounter for gynecological examination (general) (routine) without abnormal findings: Secondary | ICD-10-CM | POA: Diagnosis not present

## 2022-07-03 DIAGNOSIS — Z6838 Body mass index (BMI) 38.0-38.9, adult: Secondary | ICD-10-CM | POA: Diagnosis not present

## 2022-07-04 ENCOUNTER — Ambulatory Visit (INDEPENDENT_AMBULATORY_CARE_PROVIDER_SITE_OTHER): Payer: BC Managed Care – PPO | Admitting: Gastroenterology

## 2022-07-04 DIAGNOSIS — D509 Iron deficiency anemia, unspecified: Secondary | ICD-10-CM | POA: Diagnosis not present

## 2022-07-04 NOTE — Progress Notes (Signed)
SN: [id number] Exp: 10/01/23 LOT: [id number]  Patient arrived for VCE. Reported the prep went well. This LPN explained capsule dietary restrictions for the next few hours. Pt advised to return at 4 pm to return capsule equipment.  Patient verbalized understanding. Opened capsule, ensured capsule was flashing prior to the patient swallowing the capsule. Patient swallowed capsule without difficulty.  Patient told to call the office with any questions and if capsule has not passed after 72 hours. No further questions by the conclusion of the visit.

## 2022-07-04 NOTE — Patient Instructions (Addendum)
You may have clear liquids beginning at 10:30 am after ingesting the capsule.    You can have a light lunch at 12:30 pm; sandwich and half bowl of soup.  Return to the office at 4 pm to return the equipment.   Return to you normal diet at 5 pm.   Call [phone] and ask for Patty Phelps BSN RN if you have any questions.  You should pass the capsule in your stool 8-48 hours after ingestion. If you have not passed the capsule, after 72 hours, please contact the office at [phone].    

## 2022-07-06 ENCOUNTER — Telehealth: Payer: Self-pay | Admitting: Gastroenterology

## 2022-07-06 ENCOUNTER — Ambulatory Visit (INDEPENDENT_AMBULATORY_CARE_PROVIDER_SITE_OTHER): Payer: BC Managed Care – PPO

## 2022-07-06 VITALS — BP 140/88 | HR 77 | Temp 98.7°F | Resp 18 | Ht 66.0 in | Wt 237.0 lb

## 2022-07-06 DIAGNOSIS — D509 Iron deficiency anemia, unspecified: Secondary | ICD-10-CM

## 2022-07-06 DIAGNOSIS — E611 Iron deficiency: Secondary | ICD-10-CM | POA: Diagnosis not present

## 2022-07-06 MED ORDER — SODIUM CHLORIDE 0.9 % IV SOLN
200.0000 mg | Freq: Once | INTRAVENOUS | Status: AC
  Administered 2022-07-06: 200 mg via INTRAVENOUS
  Filled 2022-07-06: qty 10

## 2022-07-06 NOTE — Telephone Encounter (Signed)
Capsule endoscopy performed with results as outlined:  Small intestine is normal without any concerning pathology there. However, she does have irritated mucosa in her stomach which is quite friable with adherent heme.  She had some gastritis noted on EGD a few years ago.  I suspect this is the likely cause of her iron deficiency.  Unclear if this represents a GAVE or something else but it may be amenable to APC at the hospital.  I think she needs a repeat endoscopy.  I would recommend doing this at the hospital in case APC is needed so we can perform ablation of the areas if amenable.   [place] can you let her know and see if she is agreeable to endoscopy at the hospital?  If so we can place her on the list and schedule.  Thank you

## 2022-07-06 NOTE — Progress Notes (Signed)
Diagnosis: Iron Deficiency Anemia  Provider:  Chilton Greathouse MD  Procedure: IV Infusion  IV Type: Peripheral, IV Location: L Antecubital  Venofer (Iron Sucrose), Dose: 200 mg  Infusion Start Time: 1014  Infusion Stop Time: 1029  Post Infusion IV Care: Peripheral IV Discontinued  Discharge: Condition: Good, Destination: Home . AVS Declined  Performed by:  Marlow Baars Pilkington-Burchett, RN

## 2022-07-09 DIAGNOSIS — F410 Panic disorder [episodic paroxysmal anxiety] without agoraphobia: Secondary | ICD-10-CM | POA: Diagnosis not present

## 2022-07-09 DIAGNOSIS — F5101 Primary insomnia: Secondary | ICD-10-CM | POA: Diagnosis not present

## 2022-07-09 DIAGNOSIS — F3181 Bipolar II disorder: Secondary | ICD-10-CM | POA: Diagnosis not present

## 2022-07-09 DIAGNOSIS — F4312 Post-traumatic stress disorder, chronic: Secondary | ICD-10-CM | POA: Diagnosis not present

## 2022-07-09 NOTE — Telephone Encounter (Signed)
MyChart message sent to patient with VCE results and recommendations.

## 2022-07-09 NOTE — Telephone Encounter (Signed)
I called Zacharia and went over the results of the capsule with her. I am recommending an EGD to further evaluate her stomach and potentially perform APC ablation if areas amenable to it.  I discussed what this is and she is agreeable.  Bro[place]lyn can you help schedule the patient for an EGD at the hospital with me.  In looking at my schedule I am not sure if there is an opening to add her on Monday, June 17.  If there is an opening there can you please add her on?  Alternatively I am the hospital doctor on Wednesday, May 22.  I think I have an opening to add on elective outpatients at my discretion on Wednesday a.m. at [facility].  If she cannot do June 17 or there is no openings there, can you add her on for Wednesday, May 22 at 730 at the hospital at [facility]?  Thank you.

## 2022-07-10 ENCOUNTER — Other Ambulatory Visit: Payer: Self-pay

## 2022-07-10 DIAGNOSIS — D509 Iron deficiency anemia, unspecified: Secondary | ICD-10-CM

## 2022-07-10 DIAGNOSIS — R339 Retention of urine, unspecified: Secondary | ICD-10-CM | POA: Diagnosis not present

## 2022-07-10 NOTE — Telephone Encounter (Addendum)
No availability on 6/17. Called and spoke with patient to offer her an appt at [facility] on 07/25/22 at 7:30 am. Patient is aware that she will need to arrive at [facility] by 6 am with a care partner. Pt knows that I will send her instructions via MyChart. Pt verbalized understanding of all information and had no concerns at the end of the call.  Ambulatory referral to GI in epic. EGD instructions sent to patient via MyChart.

## 2022-07-10 NOTE — Addendum Note (Signed)
Addended by: Missy Sabins on: 07/10/2022 12:27 PM   Modules accepted: Orders

## 2022-07-13 ENCOUNTER — Encounter: Payer: Self-pay | Admitting: Gastroenterology

## 2022-07-13 ENCOUNTER — Ambulatory Visit (INDEPENDENT_AMBULATORY_CARE_PROVIDER_SITE_OTHER): Payer: BC Managed Care – PPO | Admitting: *Deleted

## 2022-07-13 VITALS — BP 150/95 | HR 71 | Temp 98.0°F | Resp 16 | Ht 66.0 in | Wt 236.6 lb

## 2022-07-13 DIAGNOSIS — E611 Iron deficiency: Secondary | ICD-10-CM

## 2022-07-13 DIAGNOSIS — D509 Iron deficiency anemia, unspecified: Secondary | ICD-10-CM | POA: Diagnosis not present

## 2022-07-13 MED ORDER — FAMOTIDINE IN NACL 20-0.9 MG/50ML-% IV SOLN: 20.0000 mg | Freq: Once | INTRAVENOUS | Status: DC | PRN

## 2022-07-13 MED ORDER — EPINEPHRINE 0.3 MG/0.3ML IJ SOAJ: 0.3000 mg | Freq: Once | INTRAMUSCULAR | Status: DC | PRN

## 2022-07-13 MED ORDER — SODIUM CHLORIDE 0.9 % IV SOLN: Freq: Once | INTRAVENOUS | Status: DC | PRN

## 2022-07-13 MED ORDER — SODIUM CHLORIDE 0.9 % IV SOLN
200.0000 mg | Freq: Once | INTRAVENOUS | Status: AC
  Administered 2022-07-13: 200 mg via INTRAVENOUS
  Filled 2022-07-13: qty 10

## 2022-07-13 MED ORDER — DIPHENHYDRAMINE HCL 50 MG/ML IJ SOLN: 50.0000 mg | Freq: Once | INTRAMUSCULAR | Status: DC | PRN

## 2022-07-13 MED ORDER — METHYLPREDNISOLONE SODIUM SUCC 125 MG IJ SOLR: 125.0000 mg | Freq: Once | INTRAMUSCULAR | Status: DC | PRN

## 2022-07-13 MED ORDER — ALBUTEROL SULFATE HFA 108 (90 BASE) MCG/ACT IN AERS: 2.0000 | INHALATION_SPRAY | Freq: Once | RESPIRATORY_TRACT | Status: DC | PRN

## 2022-07-13 NOTE — Progress Notes (Signed)
Diagnosis: Iron Deficiency Anemia  Provider:  Chilton Greathouse MD  Procedure: IV Infusion  IV Type: Peripheral, IV Location: L Antecubital  Venofer (Iron Sucrose), Dose: 200 mg  Infusion Start Time: 1026 am  Infusion Stop Time: 1045 am  Post Infusion IV Care: Observation period completed and Peripheral IV Discontinued  Discharge: Condition: Good, Destination: Home . AVS Provided  Performed by:  Forrest Moron, RN

## 2022-07-15 ENCOUNTER — Encounter: Payer: Self-pay | Admitting: Gastroenterology

## 2022-07-15 DIAGNOSIS — D509 Iron deficiency anemia, unspecified: Secondary | ICD-10-CM

## 2022-07-16 ENCOUNTER — Telehealth: Payer: Self-pay | Admitting: Physician Assistant

## 2022-07-20 ENCOUNTER — Ambulatory Visit (INDEPENDENT_AMBULATORY_CARE_PROVIDER_SITE_OTHER): Payer: BC Managed Care – PPO

## 2022-07-20 VITALS — BP 142/102 | HR 72 | Temp 98.9°F | Resp 16 | Ht 66.0 in | Wt 235.4 lb

## 2022-07-20 DIAGNOSIS — R310 Gross hematuria: Secondary | ICD-10-CM | POA: Diagnosis not present

## 2022-07-20 DIAGNOSIS — E611 Iron deficiency: Secondary | ICD-10-CM

## 2022-07-20 MED ORDER — SODIUM CHLORIDE 0.9 % IV SOLN
200.0000 mg | Freq: Once | INTRAVENOUS | Status: AC
  Administered 2022-07-20: 200 mg via INTRAVENOUS
  Filled 2022-07-20: qty 10

## 2022-07-20 NOTE — Progress Notes (Signed)
Diagnosis: Iron Deficiency Anemia  Provider:  Chilton Greathouse MD  Procedure: IV Infusion  IV Type: Peripheral, IV Location: L Antecubital  Venofer (Iron Sucrose), Dose: 200 mg  Infusion Start Time: 0959  Infusion Stop Time: 1013  Post Infusion IV Care: Peripheral IV Discontinued  Discharge: Condition: Good, Destination: Home . AVS Declined  Performed by:  Marlow Baars Pilkington-Burchett, RN

## 2022-07-21 ENCOUNTER — Other Ambulatory Visit: Payer: Self-pay | Admitting: Gastroenterology

## 2022-07-23 ENCOUNTER — Other Ambulatory Visit: Payer: Self-pay | Admitting: Physician Assistant

## 2022-07-23 DIAGNOSIS — F5101 Primary insomnia: Secondary | ICD-10-CM | POA: Diagnosis not present

## 2022-07-23 DIAGNOSIS — F3181 Bipolar II disorder: Secondary | ICD-10-CM | POA: Diagnosis not present

## 2022-07-23 DIAGNOSIS — D509 Iron deficiency anemia, unspecified: Secondary | ICD-10-CM

## 2022-07-23 DIAGNOSIS — F4312 Post-traumatic stress disorder, chronic: Secondary | ICD-10-CM | POA: Diagnosis not present

## 2022-07-23 DIAGNOSIS — F410 Panic disorder [episodic paroxysmal anxiety] without agoraphobia: Secondary | ICD-10-CM | POA: Diagnosis not present

## 2022-07-23 NOTE — Telephone Encounter (Signed)
Okay to refill? 

## 2022-07-23 NOTE — Telephone Encounter (Signed)
Please advise Sir, thank you. 

## 2022-07-24 ENCOUNTER — Inpatient Hospital Stay: Payer: BC Managed Care – PPO | Attending: Physician Assistant

## 2022-07-24 DIAGNOSIS — D509 Iron deficiency anemia, unspecified: Secondary | ICD-10-CM | POA: Insufficient documentation

## 2022-07-24 LAB — IRON AND IRON BINDING CAPACITY (CC-WL,HP ONLY)
Iron: 33 ug/dL (ref 28–170)
Saturation Ratios: 6 % — ABNORMAL LOW (ref 10.4–31.8)
TIBC: 577 ug/dL — ABNORMAL HIGH (ref 250–450)
UIBC: 544 ug/dL — ABNORMAL HIGH (ref 148–442)

## 2022-07-24 LAB — CBC WITH DIFFERENTIAL (CANCER CENTER ONLY)
Abs Immature Granulocytes: 0.1 10*3/uL — ABNORMAL HIGH (ref 0.00–0.07)
Basophils Absolute: 0.1 10*3/uL (ref 0.0–0.1)
Basophils Relative: 1 %
Eosinophils Absolute: 0.1 10*3/uL (ref 0.0–0.5)
Eosinophils Relative: 2 %
HCT: 35.1 % — ABNORMAL LOW (ref 36.0–46.0)
Hemoglobin: 10.1 g/dL — ABNORMAL LOW (ref 12.0–15.0)
Immature Granulocytes: 1 %
Lymphocytes Relative: 20 %
Lymphs Abs: 1.8 10*3/uL (ref 0.7–4.0)
MCH: 23.9 pg — ABNORMAL LOW (ref 26.0–34.0)
MCHC: 28.8 g/dL — ABNORMAL LOW (ref 30.0–36.0)
MCV: 83.2 fL (ref 80.0–100.0)
Monocytes Absolute: 0.5 10*3/uL (ref 0.1–1.0)
Monocytes Relative: 6 %
Neutro Abs: 6.1 10*3/uL (ref 1.7–7.7)
Neutrophils Relative %: 70 %
Platelet Count: 492 10*3/uL — ABNORMAL HIGH (ref 150–400)
RBC: 4.22 MIL/uL (ref 3.87–5.11)
RDW: 22 % — ABNORMAL HIGH (ref 11.5–15.5)
WBC Count: 8.6 10*3/uL (ref 4.0–10.5)
nRBC: 0 % (ref 0.0–0.2)

## 2022-07-24 LAB — CMP (CANCER CENTER ONLY)
ALT: 20 U/L (ref 0–44)
AST: 21 U/L (ref 15–41)
Albumin: 4.7 g/dL (ref 3.5–5.0)
Alkaline Phosphatase: 32 U/L — ABNORMAL LOW (ref 38–126)
Anion gap: 9 (ref 5–15)
BUN: 15 mg/dL (ref 6–20)
CO2: 23 mmol/L (ref 22–32)
Calcium: 9.7 mg/dL (ref 8.9–10.3)
Chloride: 106 mmol/L (ref 98–111)
Creatinine: 0.82 mg/dL (ref 0.44–1.00)
GFR, Estimated: >60 mL/min (ref >60)
Glucose, Bld: 120 mg/dL — ABNORMAL HIGH (ref 70–99)
Potassium: 4.3 mmol/L (ref 3.5–5.1)
Sodium: 138 mmol/L (ref 135–145)
Total Bilirubin: 0.4 mg/dL (ref 0.3–1.2)
Total Protein: 7 g/dL (ref 6.5–8.1)

## 2022-07-24 LAB — FERRITIN: Ferritin: 94 ng/mL (ref 11–307)

## 2022-07-25 ENCOUNTER — Encounter (HOSPITAL_COMMUNITY)
Admission: RE | Disposition: A | Discharge status: Home / Self Care | Payer: Self-pay | Source: Ambulatory Visit | Attending: Gastroenterology

## 2022-07-25 ENCOUNTER — Other Ambulatory Visit: Payer: BC Managed Care – PPO

## 2022-07-25 ENCOUNTER — Ambulatory Visit (HOSPITAL_COMMUNITY)
Admission: RE | Admit: 2022-07-25 | Discharge: 2022-07-25 | Disposition: A | Discharge status: Home / Self Care | Payer: BC Managed Care – PPO | Source: Ambulatory Visit | Attending: Gastroenterology | Admitting: Gastroenterology

## 2022-07-25 ENCOUNTER — Ambulatory Visit (HOSPITAL_COMMUNITY): Payer: BC Managed Care – PPO | Admitting: Anesthesiology

## 2022-07-25 ENCOUNTER — Other Ambulatory Visit: Payer: Self-pay

## 2022-07-25 ENCOUNTER — Encounter (HOSPITAL_COMMUNITY): Payer: Self-pay | Admitting: Gastroenterology

## 2022-07-25 DIAGNOSIS — Z8249 Family history of ischemic heart disease and other diseases of the circulatory system: Secondary | ICD-10-CM | POA: Insufficient documentation

## 2022-07-25 DIAGNOSIS — Z8719 Personal history of other diseases of the digestive system: Secondary | ICD-10-CM | POA: Diagnosis not present

## 2022-07-25 DIAGNOSIS — K31819 Angiodysplasia of stomach and duodenum without bleeding: Secondary | ICD-10-CM | POA: Diagnosis not present

## 2022-07-25 DIAGNOSIS — Z833 Family history of diabetes mellitus: Secondary | ICD-10-CM | POA: Insufficient documentation

## 2022-07-25 DIAGNOSIS — I10 Essential (primary) hypertension: Secondary | ICD-10-CM | POA: Insufficient documentation

## 2022-07-25 DIAGNOSIS — K319 Disease of stomach and duodenum, unspecified: Secondary | ICD-10-CM | POA: Insufficient documentation

## 2022-07-25 DIAGNOSIS — Q402 Other specified congenital malformations of stomach: Secondary | ICD-10-CM

## 2022-07-25 DIAGNOSIS — E119 Type 2 diabetes mellitus without complications: Secondary | ICD-10-CM | POA: Diagnosis not present

## 2022-07-25 DIAGNOSIS — K449 Diaphragmatic hernia without obstruction or gangrene: Secondary | ICD-10-CM | POA: Diagnosis not present

## 2022-07-25 DIAGNOSIS — M1990 Unspecified osteoarthritis, unspecified site: Secondary | ICD-10-CM | POA: Insufficient documentation

## 2022-07-25 DIAGNOSIS — Z79899 Other long term (current) drug therapy: Secondary | ICD-10-CM | POA: Insufficient documentation

## 2022-07-25 DIAGNOSIS — D509 Iron deficiency anemia, unspecified: Secondary | ICD-10-CM | POA: Diagnosis not present

## 2022-07-25 DIAGNOSIS — K219 Gastro-esophageal reflux disease without esophagitis: Secondary | ICD-10-CM | POA: Insufficient documentation

## 2022-07-25 DIAGNOSIS — E039 Hypothyroidism, unspecified: Secondary | ICD-10-CM | POA: Insufficient documentation

## 2022-07-25 DIAGNOSIS — M797 Fibromyalgia: Secondary | ICD-10-CM | POA: Insufficient documentation

## 2022-07-25 HISTORY — PX: HOT HEMOSTASIS: SHX5433

## 2022-07-25 HISTORY — PX: ESOPHAGOGASTRODUODENOSCOPY (EGD) WITH PROPOFOL: SHX5813

## 2022-07-25 SURGERY — ESOPHAGOGASTRODUODENOSCOPY (EGD) WITH PROPOFOL
Anesthesia: Monitor Anesthesia Care

## 2022-07-25 MED ORDER — PROPOFOL 500 MG/50ML IV EMUL
INTRAVENOUS | Status: DC | PRN
  Administered 2022-07-25: 175 ug/kg/min via INTRAVENOUS

## 2022-07-25 MED ORDER — LIDOCAINE 2% (20 MG/ML) 5 ML SYRINGE
INTRAMUSCULAR | Status: DC | PRN
  Administered 2022-07-25: 100 mg via INTRAVENOUS

## 2022-07-25 MED ORDER — LACTATED RINGERS IV SOLN: INTRAVENOUS | Status: DC | PRN

## 2022-07-25 MED ORDER — PROPOFOL 10 MG/ML IV BOLUS
INTRAVENOUS | Status: DC | PRN
  Administered 2022-07-25: 50 mg via INTRAVENOUS
  Administered 2022-07-25: 30 mg via INTRAVENOUS
  Administered 2022-07-25: 50 mg via INTRAVENOUS
  Administered 2022-07-25: 20 mg via INTRAVENOUS

## 2022-07-25 MED ORDER — SODIUM CHLORIDE 0.9 % IV SOLN: INTRAVENOUS | Status: DC

## 2022-07-25 SURGICAL SUPPLY — 15 items

## 2022-07-25 NOTE — Discharge Instructions (Signed)
YOU HAD AN ENDOSCOPIC PROCEDURE TODAY: Refer to the procedure report and other information in the discharge instructions given to you for any specific questions about what was found during the examination. If this information does not answer your questions, please call [place] office at [phone] to clarify.  ° °YOU SHOULD EXPECT: Some feelings of bloating in the abdomen. Passage of more gas than usual. Walking can help get rid of the air that was put into your GI tract during the procedure and reduce the bloating.. ° °DIET: Your first meal following the procedure should be a light meal and then it is ok to progress to your normal diet. A half-sandwich or bowl of soup is an example of a good first meal. Heavy or fried foods are harder to digest and may make you feel nauseous or bloated. Drink plenty of fluids but you should avoid alcoholic beverages for 24 hours. If you had a esophageal dilation, please see attached instructions for diet.   ° °ACTIVITY: Your care partner should take you home directly after the procedure. You should plan to take it easy, moving slowly for the rest of the day. You can resume normal activity the day after the procedure however YOU SHOULD NOT DRIVE, use power tools, machinery or perform tasks that involve climbing or major physical exertion for 24 hours (because of the sedation medicines used during the test).  ° °SYMPTOMS TO REPORT IMMEDIATELY: °A gastroenterologist can be reached at any hour. Please call [phone]  for any of the following symptoms:  ° °Following upper endoscopy (EGD, EUS, ERCP, esophageal dilation) °Vomiting of blood or coffee ground material  °New, significant abdominal pain  °New, significant chest pain or pain under the shoulder blades  °Painful or persistently difficult swallowing  °New shortness of breath  °Black, tarry-looking or red, bloody stools ° °FOLLOW UP:  °If any biopsies were taken you will be contacted by phone or by letter within the next 1-3  weeks. Call [phone]  if you have not heard about the biopsies in 3 weeks.  °Please also call with any specific questions about appointments or follow up tests. ° °

## 2022-07-25 NOTE — Transfer of Care (Signed)
Immediate Anesthesia Transfer of Care Note  Patient: [name]  Procedure(s) Performed: ESOPHAGOGASTRODUODENOSCOPY (EGD) WITH PROPOFOL HOT HEMOSTASIS (ARGON PLASMA COAGULATION/BICAP)  Patient Location: Short Stay  Anesthesia Type:MAC  Level of Consciousness: awake  Airway & Oxygen Therapy: Patient Spontanous Breathing  Post-op Assessment: Report given to RN and Post -op Vital signs reviewed and stable  Post vital signs: Reviewed and stable  Last Vitals:  Vitals Value Taken Time  BP 123/60   Temp    Pulse 79   Resp 15   SpO2 97     Last Pain:  Vitals:   07/25/22 0700  TempSrc: Temporal  PainSc: 6          Complications: No notable events documented.

## 2022-07-25 NOTE — H&P (Signed)
[facility] History and Physical   Primary Care Physician:  Chilton Greathouse, MD   Reason for Procedure:   Iron deficiency - abnormal capsule exam with vascular lesions in the stomach, suspected source of anemia  Plan:    EGD with possible APC ablation     HPI: SARDE LENE is a 42 y.o. female  here for EGD to further evaluate IDA. Capsule study done recently showing adherent heme and gastritis with possible vascular lesions in the stomach. History of gastritis in the past. She is on omeprazole 40mg  BID and carafate. Also getting IV Iron infusions. Patient denies any bowel symptoms at this time. Otherwise feels well without any cardiopulmonary symptoms.   I have discussed risks / benefits of anesthesia and endoscopic procedure with Carrolyn Meiers and they wish to proceed with the exams as outlined today.    Past Medical History:  Diagnosis Date   ADHD (attention deficit hyperactivity disorder)    Allergy    Anemia    Anxiety    Arthritis, lumbar spine    Bipolar disorder (HCC)    Chronic back pain    Chronic headaches    Chronic interstitial cystitis    "removed my bladder when I went to end stage IC"   Depression    Disc degeneration, lumbar 01/10/2022   Fibromyalgia    GERD (gastroesophageal reflux disease)    Gestational diabetes    resolved   History of blood transfusion 02/2010   "related to OR"   History of kidney stones    HLD (hyperlipidemia)    Hypothyroid    IBS (irritable colon syndrome)    IDA (iron deficiency anemia)    Interstitial cystitis 06/2008   Dr. Andrey Spearman [facility]   Polycystic ovarian disease    Preeclampsia 2009   Self-catheterizes urinary bladder    "I have an [place] pouch; made a bladder using part of my small intestines; cath out of stoma in my belly button" (08/02/2015)   Small bowel obstruction (HCC) 08/03/2015   Spina bifida (HCC) 01/10/2022   Thyroid disease    Vitamin D deficiency     Past Surgical History:   Procedure Laterality Date   ABDOMINAL HYSTERECTOMY  02/2010   "left my ovaries"   APPENDECTOMY  02/2010   BLADDER REMOVAL  02/2010   and [place] Pouch   CESAREAN SECTION  2009   CHOLECYSTECTOMY N/A 05/27/2017   Procedure: LAPAROSCOPIC CHOLECYSTECTOMY;  Surgeon: Axel Filler, MD;  Location: [facility] OR;  Service: General;  Laterality: N/A;   COLONOSCOPY     indiana pouch  01/19/2021   Cystoscopy with bulk amid injection   LAPAROSCOPIC LYSIS OF ADHESIONS N/A 05/27/2017   Procedure: LAPAROSCOPIC LYSIS OF ADHESIONS;  Surgeon: Axel Filler, MD;  Location: [facility] OR;  Service: General;  Laterality: N/A;   OTHER SURGICAL HISTORY     remval of urethra when bladder was removed   SMALL INTESTINE SURGERY  2017   for bowel obstruction   SPINAL CORD STIMULATOR IMPLANT  09/2009; ~ 04/2015   SPINAL CORD STIMULATOR REMOVAL  ~ 04/2015   UPPER GI ENDOSCOPY     WISDOM TOOTH EXTRACTION      Prior to Admission medications   Medication Sig Start Date End Date Taking? Authorizing Provider  acetaminophen (TYLENOL) 650 MG CR tablet Take 1,300 mg by mouth 2 (two) times daily.   Yes [provider]  amantadine (SYMMETREL) 100 MG capsule Take 100 mg by mouth 2 (two) times daily. 02/06/21  Yes [provider]  amphetamine-dextroamphetamine (ADDERALL) 30 MG tablet Take 30 mg by mouth 2 (two) times daily.   Yes [provider]  clonazePAM (KLONOPIN) 2 MG tablet Take 1 mg by mouth 3 (three) times daily as needed for anxiety. 08/03/20  Yes [provider]  D3-50 1.25 MG (50000 UT) capsule Take 50,000 Units by mouth 3 (three) times a week. 12/12/20  Yes [provider]  dicyclomine (BENTYL) 10 MG capsule TAKE 1 CAPSULE BY MOUTH TWICE A DAY Patient taking differently: Take 10 mg by mouth 2 (two) times daily. 09/29/21  Yes Zehr, Princella Pellegrini, PA-C  diphenoxylate-atropine (LOMOTIL) 2.5-0.025 MG tablet Take 1 tablet by mouth 4 (four) times daily as needed for diarrhea or loose stools  (IBS). 07/24/22  Yes Anani Gu, Willaim Rayas, MD  fenofibrate 160 MG tablet Take 160 mg by mouth at bedtime.   Yes [provider]  icosapent Ethyl (VASCEPA) 1 g capsule Take 2 g by mouth 2 (two) times daily.   Yes [provider]  lamoTRIgine (LAMICTAL) 200 MG tablet Take 400 mg by mouth at bedtime. 05/17/21  Yes [provider]  levonorgestrel-ethinyl estradiol (SEASONALE,INTROVALE,JOLESSA) 0.15-0.03 MG tablet Take 1 tablet by mouth in the morning.   Yes [provider]  levothyroxine (SYNTHROID) 125 MCG tablet Take 125 mcg by mouth daily before breakfast. 12/07/14  Yes [provider]  lithium carbonate (ESKALITH) 450 MG ER tablet Take 675 mg by mouth at bedtime. 06/08/22  Yes [provider]  lumateperone tosylate (CAPLYTA) 42 MG capsule Take 42 mg by mouth at bedtime. 01/02/21  Yes [provider]  metoprolol succinate (TOPROL-XL) 100 MG 24 hr tablet Take 100 mg by mouth at bedtime.   Yes [provider]  nitrofurantoin (MACRODANTIN) 50 MG capsule Take 50 mg by mouth at bedtime. 01/03/21  Yes [provider]  nystatin cream (MYCOSTATIN) Apply 1 Application topically daily as needed (vaginal itching). 07/03/22  Yes [provider]  omeprazole (PRILOSEC) 40 MG capsule Take 1 capsule (40 mg total) by mouth 2 (two) times daily. 06/21/22  Yes Damon Hargrove, Willaim Rayas, MD  ondansetron (ZOFRAN) 4 MG tablet Take 2-4 mg by mouth every 8 (eight) hours as needed for vomiting or nausea. 01/30/21  Yes [provider]  ramelteon (ROZEREM) 8 MG tablet Take 1 tablet by mouth at bedtime.   Yes [provider]  rOPINIRole (REQUIP) 2 MG tablet Take 2 mg by mouth at bedtime. 06/19/22  Yes [provider]  rosuvastatin (CRESTOR) 5 MG tablet Take 1 tablet by mouth at bedtime.   Yes [provider]  sucralfate (CARAFATE) 1 g tablet Take 1 tablet (1 g total) by mouth every 6 (six) hours as needed. Patient  taking differently: Take 1 g by mouth 2 (two) times daily. 06/21/22  Yes Felder Lebeda, Willaim Rayas, MD  telmisartan (MICARDIS) 40 MG tablet Take 40 mg by mouth at bedtime. 02/01/21  Yes [provider]  tiZANidine (ZANAFLEX) 4 MG tablet Take 8 mg by mouth 2 (two) times daily. 11/22/20  Yes [provider]  triamcinolone cream (KENALOG) 0.1 % Apply 1 Application topically daily as needed (itching). 07/03/22  Yes [provider]  valbenazine (INGREZZA) 80 MG capsule Take 80 mg by mouth at bedtime.   Yes [provider]  oxybutynin (DITROPAN) 5 MG/5ML syrup 5 mg 2 (two) times daily as needed. Per [place] pouch Patient not taking: Reported on 07/23/2022    [provider]    No current facility-administered medications  for this encounter.    Allergies as of 07/10/2022 - Review Complete 06/21/2022  Allergen Reaction Noted   Keflex [cephalexin] Hives 02/16/2014   Penicillins Hives and Other (See Comments) 02/16/2014   Sulfa antibiotics Hives 02/16/2014    Family History  Problem Relation Age of Onset   Lung cancer Mother        smoker   Other Father        benigh tumor between heart and lungs, had surgery and something went wrong, he died at 53   Diabetes Paternal Grandmother    Heart disease Paternal Grandmother    Diabetes Paternal Grandfather    Heart disease Paternal Grandfather    Pancreatic cancer Paternal Grandfather    Liver cancer Paternal Grandfather    Breast cancer Maternal Aunt    Colon cancer Neg Hx    Esophageal cancer Neg Hx    Prostate cancer Neg Hx    Rectal cancer Neg Hx    Stomach cancer Neg Hx     Social History   Socioeconomic History   Marital status: Married    Spouse name: Not on file   Number of children: 1   Years of education: Not on file   Highest education level: Not on file  Occupational History   Occupation: Disabled  Tobacco Use   Smoking status: Never   Smokeless tobacco: Never  Vaping Use   Vaping  Use: Never used  Substance and Sexual Activity   Alcohol use: Yes    Comment: 08/03/2015 "glass of wine maybe twice/month"   Drug use: No   Sexual activity: Yes    Comment: hysterectomy  Other Topics Concern   Not on file  Social History Narrative   Not on file   Social Determinants of Health   Financial Resource Strain: Not on file  Food Insecurity: Not on file  Transportation Needs: Not on file  Physical Activity: Not on file  Stress: Not on file  Social Connections: Not on file  Intimate Partner Violence: Not on file    Review of Systems: All other review of systems negative except as mentioned in the HPI.  Physical Exam: Vital signs There were no vitals taken for this visit.  General:   Alert,  Well-developed, pleasant and cooperative in NAD Lungs:  Clear throughout to auscultation.   Heart:  Regular rate and rhythm Abdomen:  Soft, nontender and nondistended.   Neuro/Psych:  Alert and cooperative. Normal mood and affect. A and O x 3  Harlin Rain, MD [facility]

## 2022-07-25 NOTE — Anesthesia Procedure Notes (Signed)
Procedure Name: General with mask airway Date/Time: 07/25/2022 7:36 AM  Performed by: Katina Degree, CRNAPre-anesthesia Checklist: Patient identified, Emergency Drugs available, Suction available and Patient being monitored Patient Re-evaluated:Patient Re-evaluated prior to induction Oxygen Delivery Method: Nasal cannula Preoxygenation: Pre-oxygenation with 100% oxygen Induction Type: IV induction Airway Equipment and Method: Bite block Dental Injury: Teeth and Oropharynx as per pre-operative assessment

## 2022-07-25 NOTE — Op Note (Signed)
[facility] Patient Name: [name] Procedure Date : 07/25/2022 MRN: [id number] Attending MD: Willaim Rayas. Adela Lank , MD, [id number] Date of Birth: 04/19/1980 CSN: [id number] Age: 42 Admit Type: Outpatient Procedure:                Upper GI endoscopy Indications:              Iron deficiency anemia - prior EGD and colonoscopy                            in 2022, gastritis noted. Recent capsule showed                            vascular changes in the stomach with adherent heme,                            suspicious for GAVE. EGD to further evaluate and                            treat if appropriate. Providers:                Willaim Rayas. Adela Lank, MD, Marge Duncans, RN,                            Sunday Corn Mbumina, Technician Referring MD:              Medicines:                Monitored Anesthesia Care Complications:            No immediate complications. Estimated blood loss:                            Minimal. Estimated Blood Loss:     Estimated blood loss was minimal. Procedure:                Pre-Anesthesia Assessment:                           - Prior to the procedure, a History and Physical                            was performed, and patient medications and                            allergies were reviewed. The patient's tolerance of                            previous anesthesia was also reviewed. The risks                            and benefits of the procedure and the sedation                            options and risks were discussed with the patient.  All questions were answered, and informed consent                            was obtained. Prior Anticoagulants: The patient has                            taken no anticoagulant or antiplatelet agents. ASA                            Grade Assessment: II - A patient with mild systemic                            disease. After reviewing the risks and benefits,                             the patient was deemed in satisfactory condition to                            undergo the procedure.                           After obtaining informed consent, the endoscope was                            passed under direct vision. Throughout the                            procedure, the patient's blood pressure, pulse, and                            oxygen saturations were monitored continuously. The                            [id number]-H190 ([id number]) Olympus endoscope was introduced                            through the mouth, and advanced to the second part                            of duodenum. The upper GI endoscopy was                            accomplished without difficulty. The patient                            tolerated the procedure well. Scope In: Scope Out: 7:56:06 AM Findings:      Esophagogastric landmarks were identified: the Z-line was found at 37       cm, the gastroesophageal junction was found at 37 cm and the upper       extent of the gastric folds was found at 38 cm from the incisors.      A 1 cm hiatal hernia was present.      A single area of ectopic gastric mucosa was found in the upper third of  the esophagus.      The exam of the esophagus was otherwise normal.      Changes suspicious for mild gastric antral vascular ectasia was present       in the gastric antrum. These areas appeared to have mild changes but       were friable and one area oozed easily with contract. Fulguration to       ablate the affected areas to prevent bleeding by argon plasma was       successful.      The exam of the stomach was otherwise normal.      The examined duodenum was normal. Impression:               - Esophagogastric landmarks identified.                           - 1 cm hiatal hernia.                           - Ectopic gastric mucosa in the upper third of the                            esophagus.                           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 Treated with argon plasma coagulation                            (APC).                           - Normal examined duodenum. Recommendation:           - Patient has a contact number available for                            emergencies. The signs and symptoms of potential                            delayed complications were discussed with the                            patient. Return to normal activities tomorrow.                            Written discharge instructions were provided to the                            patient.                           -  Resume previous diet.                           - Continue present medications.                           - Continue omeprazole 40mg  twice daily                           - Avoid NSAIDs                           - Continue carafate 1 gm table four times daily for                            the next week                           - follow up CBC in 2 weeks Procedure Code(s):        --- Professional ---                           [phone], Esophagogastroduodenoscopy, flexible,                            transoral; with control of bleeding, any method Diagnosis Code(s):        --- Professional ---                           K44.9, Diaphragmatic hernia without obstruction or                            gangrene                           Q40.2, Other specified congenital malformations of                            stomach                           K31.819, Angiodysplasia of stomach and duodenum                            without bleeding                           D50.9, Iron deficiency anemia, unspecified CPT copyright 2022 American Medical Association. All rights reserved. The codes documented in this report are preliminary and upon coder review may  be revised to  meet current compliance requirements. Viviann Spare P. Adela Lank, MD 07/25/2022 8:05:58 AM This report has been signed electronically. Number of Addenda: 0

## 2022-07-25 NOTE — Anesthesia Preprocedure Evaluation (Signed)
Anesthesia Evaluation  Patient identified by MRN, date of birth, ID band Patient awake    Reviewed: Allergy & Precautions, NPO status , Patient's Chart, lab work & pertinent test results  Airway Mallampati: II  TM Distance: >3 FB Neck ROM: Full    Dental   Pulmonary neg pulmonary ROS   breath sounds clear to auscultation       Cardiovascular hypertension, Pt. on medications and Pt. on home beta blockers  Rhythm:Regular Rate:Normal     Neuro/Psych  Headaches    GI/Hepatic Neg liver ROS,GERD  ,,  Endo/Other  diabetes, Type 2Hypothyroidism    Renal/GU negative Renal ROS     Musculoskeletal  (+) Arthritis ,  Fibromyalgia -  Abdominal   Peds  Hematology  (+) Blood dyscrasia, anemia   Anesthesia Other Findings   Reproductive/Obstetrics                             Anesthesia Physical Anesthesia Plan  ASA: 2  Anesthesia Plan: MAC   Post-op Pain Management:    Induction:   PONV Risk Score and Plan: 2 and Propofol infusion  Airway Management Planned: Natural Airway and Nasal Cannula  Additional Equipment:   Intra-op Plan:   Post-operative Plan:   Informed Consent: I have reviewed the patients History and Physical, chart, labs and discussed the procedure including the risks, benefits and alternatives for the proposed anesthesia with the patient or authorized representative who has indicated his/her understanding and acceptance.       Plan Discussed with:   Anesthesia Plan Comments:        Anesthesia Quick Evaluation

## 2022-07-26 NOTE — Anesthesia Postprocedure Evaluation (Signed)
Anesthesia Post Note  Patient: [name]  Procedure(s) Performed: ESOPHAGOGASTRODUODENOSCOPY (EGD) WITH PROPOFOL HOT HEMOSTASIS (ARGON PLASMA COAGULATION/BICAP)     Patient location during evaluation: PACU Anesthesia Type: MAC Level of consciousness: awake and alert Pain management: pain level controlled Vital Signs Assessment: post-procedure vital signs reviewed and stable Respiratory status: spontaneous breathing, nonlabored ventilation, respiratory function stable and patient connected to nasal cannula oxygen Cardiovascular status: stable and blood pressure returned to baseline Postop Assessment: no apparent nausea or vomiting Anesthetic complications: no   No notable events documented.  Last Vitals:  Vitals:   07/25/22 0810 07/25/22 0820  BP: 125/63 136/77  Pulse: 70 68  Resp: 19 15  Temp:    SpO2: 97% 99%    Last Pain:  Vitals:   07/25/22 0820  TempSrc:   PainSc: 7    Pain Goal:                   Kennieth Rad

## 2022-07-27 ENCOUNTER — Other Ambulatory Visit: Payer: Self-pay | Admitting: Physician Assistant

## 2022-07-27 ENCOUNTER — Telehealth: Payer: Self-pay

## 2022-07-27 NOTE — Telephone Encounter (Signed)
Pt advised with VU and is aware [facility] will call to set up appts.

## 2022-07-27 NOTE — Telephone Encounter (Signed)
-----   Message from Briant Cedar, PA-C sent at 07/27/2022  3:37 PM EDT ----- Please notify patient that iron levels are improving but will need more 2 more IV venofer infusions to bolster her Hgb levels.     ----- Message ----- From: Leory Plowman, [facility] [place] Sent: 07/24/2022  10:08 AM EDT To: Briant Cedar, PA-C

## 2022-07-29 ENCOUNTER — Encounter (HOSPITAL_COMMUNITY): Payer: Self-pay | Admitting: Gastroenterology

## 2022-07-31 ENCOUNTER — Encounter: Payer: Self-pay | Admitting: Gastroenterology

## 2022-07-31 DIAGNOSIS — F3181 Bipolar II disorder: Secondary | ICD-10-CM | POA: Diagnosis not present

## 2022-07-31 DIAGNOSIS — F5101 Primary insomnia: Secondary | ICD-10-CM | POA: Diagnosis not present

## 2022-07-31 DIAGNOSIS — F410 Panic disorder [episodic paroxysmal anxiety] without agoraphobia: Secondary | ICD-10-CM | POA: Diagnosis not present

## 2022-07-31 DIAGNOSIS — F4312 Post-traumatic stress disorder, chronic: Secondary | ICD-10-CM | POA: Diagnosis not present

## 2022-08-01 ENCOUNTER — Encounter: Payer: Self-pay | Admitting: Physician Assistant

## 2022-08-01 ENCOUNTER — Other Ambulatory Visit: Payer: Self-pay

## 2022-08-01 ENCOUNTER — Telehealth: Payer: Self-pay | Admitting: Pharmacy Technician

## 2022-08-01 DIAGNOSIS — D500 Iron deficiency anemia secondary to blood loss (chronic): Secondary | ICD-10-CM

## 2022-08-01 NOTE — Telephone Encounter (Addendum)
Auth Submission: NO AUTH NEEDED Site of care: Site of care: [facility] Payer: BCBS Medication & CPT/J Code(s) submitted: Venofer (Iron Sucrose) J1756 Route of submission (phone, fax, portal):  Phone # Fax # Auth type: Buy/Bill Units/visits requested: 2 Reference number:  Approval from: 07/27/22 to 11/27/22

## 2022-08-02 ENCOUNTER — Other Ambulatory Visit: Payer: Self-pay | Admitting: Gastroenterology

## 2022-08-07 DIAGNOSIS — F4312 Post-traumatic stress disorder, chronic: Secondary | ICD-10-CM | POA: Diagnosis not present

## 2022-08-07 DIAGNOSIS — F3181 Bipolar II disorder: Secondary | ICD-10-CM | POA: Diagnosis not present

## 2022-08-07 DIAGNOSIS — F5101 Primary insomnia: Secondary | ICD-10-CM | POA: Diagnosis not present

## 2022-08-07 DIAGNOSIS — F410 Panic disorder [episodic paroxysmal anxiety] without agoraphobia: Secondary | ICD-10-CM | POA: Diagnosis not present

## 2022-08-08 ENCOUNTER — Encounter: Payer: Self-pay | Admitting: Physician Assistant

## 2022-08-08 ENCOUNTER — Other Ambulatory Visit: Payer: Self-pay | Admitting: Gastroenterology

## 2022-08-08 DIAGNOSIS — R339 Retention of urine, unspecified: Secondary | ICD-10-CM | POA: Diagnosis not present

## 2022-08-10 NOTE — Telephone Encounter (Signed)
Vernona Rieger, Please call and schedule patient. Thanks Selena Batten

## 2022-08-14 ENCOUNTER — Ambulatory Visit (INDEPENDENT_AMBULATORY_CARE_PROVIDER_SITE_OTHER): Payer: BC Managed Care – PPO

## 2022-08-14 VITALS — BP 161/88 | HR 80 | Temp 98.4°F | Resp 18 | Ht 66.0 in | Wt 238.2 lb

## 2022-08-14 DIAGNOSIS — E611 Iron deficiency: Secondary | ICD-10-CM

## 2022-08-14 DIAGNOSIS — D500 Iron deficiency anemia secondary to blood loss (chronic): Secondary | ICD-10-CM

## 2022-08-14 MED ORDER — SODIUM CHLORIDE 0.9 % IV SOLN
200.0000 mg | Freq: Once | INTRAVENOUS | Status: AC
  Administered 2022-08-14: 200 mg via INTRAVENOUS
  Filled 2022-08-14: qty 10

## 2022-08-14 NOTE — Progress Notes (Signed)
Diagnosis: Iron Deficiency Anemia  Provider:  Chilton Greathouse MD  Procedure: IV Infusion  IV Type: Peripheral, IV Location: L Antecubital  Venofer (Iron Sucrose), Dose: 200 mg  Infusion Start Time: 1408  Infusion Stop Time: 1428  Post Infusion IV Care: Patient declined observation and Peripheral IV Discontinued  Discharge: Condition: Good, Destination: Home . AVS Declined  Performed by:  Adriana Mccallum, RN

## 2022-08-20 ENCOUNTER — Ambulatory Visit: Payer: BC Managed Care – PPO | Admitting: Gastroenterology

## 2022-08-21 ENCOUNTER — Ambulatory Visit (INDEPENDENT_AMBULATORY_CARE_PROVIDER_SITE_OTHER): Payer: BC Managed Care – PPO

## 2022-08-21 VITALS — BP 167/100 | HR 79 | Temp 98.9°F | Resp 18 | Ht 66.0 in | Wt 238.4 lb

## 2022-08-21 DIAGNOSIS — E611 Iron deficiency: Secondary | ICD-10-CM | POA: Diagnosis not present

## 2022-08-21 DIAGNOSIS — D500 Iron deficiency anemia secondary to blood loss (chronic): Secondary | ICD-10-CM

## 2022-08-21 MED ORDER — SODIUM CHLORIDE 0.9 % IV SOLN
200.0000 mg | Freq: Once | INTRAVENOUS | Status: AC
  Administered 2022-08-21: 200 mg via INTRAVENOUS
  Filled 2022-08-21: qty 10

## 2022-08-21 NOTE — Progress Notes (Signed)
Diagnosis: Iron Deficiency Anemia  Provider:  Chilton Greathouse MD  Procedure: IV Infusion  IV Type: Peripheral, IV Location: R Antecubital  Venofer (Iron Sucrose), Dose: 200 mg  Infusion Start Time: 1243  Infusion Stop Time: 1301  Post Infusion IV Care: Peripheral IV Discontinued  Discharge: Condition: Good, Destination: Home . AVS Declined  Performed by:  Loney Hering, LPN

## 2022-08-29 ENCOUNTER — Encounter: Payer: Self-pay | Admitting: Physician Assistant

## 2022-08-29 DIAGNOSIS — F410 Panic disorder [episodic paroxysmal anxiety] without agoraphobia: Secondary | ICD-10-CM | POA: Diagnosis not present

## 2022-08-29 DIAGNOSIS — F4312 Post-traumatic stress disorder, chronic: Secondary | ICD-10-CM | POA: Diagnosis not present

## 2022-08-29 DIAGNOSIS — F5101 Primary insomnia: Secondary | ICD-10-CM | POA: Diagnosis not present

## 2022-08-29 DIAGNOSIS — F3181 Bipolar II disorder: Secondary | ICD-10-CM | POA: Diagnosis not present

## 2022-09-01 ENCOUNTER — Other Ambulatory Visit: Payer: Self-pay | Admitting: Gastroenterology

## 2022-09-01 DIAGNOSIS — K2970 Gastritis, unspecified, without bleeding: Secondary | ICD-10-CM

## 2022-09-04 ENCOUNTER — Other Ambulatory Visit: Payer: Self-pay | Admitting: Physician Assistant

## 2022-09-04 DIAGNOSIS — D509 Iron deficiency anemia, unspecified: Secondary | ICD-10-CM

## 2022-09-05 ENCOUNTER — Inpatient Hospital Stay: Payer: BC Managed Care – PPO | Attending: Physician Assistant

## 2022-09-05 ENCOUNTER — Other Ambulatory Visit: Payer: Self-pay

## 2022-09-05 ENCOUNTER — Inpatient Hospital Stay (HOSPITAL_BASED_OUTPATIENT_CLINIC_OR_DEPARTMENT_OTHER): Payer: BC Managed Care – PPO | Admitting: Physician Assistant

## 2022-09-05 VITALS — BP 151/81 | HR 79 | Temp 98.2°F | Resp 20 | Wt 239.7 lb

## 2022-09-05 DIAGNOSIS — Z808 Family history of malignant neoplasm of other organs or systems: Secondary | ICD-10-CM | POA: Insufficient documentation

## 2022-09-05 DIAGNOSIS — L659 Nonscarring hair loss, unspecified: Secondary | ICD-10-CM

## 2022-09-05 DIAGNOSIS — Z79899 Other long term (current) drug therapy: Secondary | ICD-10-CM | POA: Insufficient documentation

## 2022-09-05 DIAGNOSIS — D509 Iron deficiency anemia, unspecified: Secondary | ICD-10-CM | POA: Diagnosis not present

## 2022-09-05 DIAGNOSIS — Z800 Family history of malignant neoplasm of digestive organs: Secondary | ICD-10-CM | POA: Diagnosis not present

## 2022-09-05 DIAGNOSIS — Z803 Family history of malignant neoplasm of breast: Secondary | ICD-10-CM | POA: Diagnosis not present

## 2022-09-05 DIAGNOSIS — Z801 Family history of malignant neoplasm of trachea, bronchus and lung: Secondary | ICD-10-CM | POA: Diagnosis not present

## 2022-09-05 LAB — FERRITIN: Ferritin: 37 ng/mL (ref 11–307)

## 2022-09-05 LAB — IRON AND IRON BINDING CAPACITY (CC-WL,HP ONLY)
Iron: 31 ug/dL (ref 28–170)
Saturation Ratios: 5 % — ABNORMAL LOW (ref 10.4–31.8)
TIBC: 602 ug/dL — ABNORMAL HIGH (ref 250–450)
UIBC: 571 ug/dL — ABNORMAL HIGH (ref 148–442)

## 2022-09-05 LAB — CBC WITH DIFFERENTIAL (CANCER CENTER ONLY)
Abs Immature Granulocytes: 0.06 10*3/uL (ref 0.00–0.07)
Basophils Absolute: 0.1 10*3/uL (ref 0.0–0.1)
Basophils Relative: 1 %
Eosinophils Absolute: 0.3 10*3/uL (ref 0.0–0.5)
Eosinophils Relative: 3 %
HCT: 36.4 % (ref 36.0–46.0)
Hemoglobin: 10.5 g/dL — ABNORMAL LOW (ref 12.0–15.0)
Immature Granulocytes: 1 %
Lymphocytes Relative: 21 %
Lymphs Abs: 1.9 10*3/uL (ref 0.7–4.0)
MCH: 24 pg — ABNORMAL LOW (ref 26.0–34.0)
MCHC: 28.8 g/dL — ABNORMAL LOW (ref 30.0–36.0)
MCV: 83.1 fL (ref 80.0–100.0)
Monocytes Absolute: 0.6 10*3/uL (ref 0.1–1.0)
Monocytes Relative: 7 %
Neutro Abs: 6.2 10*3/uL (ref 1.7–7.7)
Neutrophils Relative %: 67 %
Platelet Count: 517 10*3/uL — ABNORMAL HIGH (ref 150–400)
RBC: 4.38 MIL/uL (ref 3.87–5.11)
RDW: 20.3 % — ABNORMAL HIGH (ref 11.5–15.5)
WBC Count: 9.1 10*3/uL (ref 4.0–10.5)
nRBC: 0 % (ref 0.0–0.2)

## 2022-09-05 NOTE — Progress Notes (Signed)
[facility]  PROGRESS NOTE  Patient Care Team: Chilton Greathouse, MD as PCP - General (Internal Medicine)  CHIEF COMPLAINTS/PURPOSE OF CONSULTATION:  Iron deficiency anemia  TREATMENT HISTORY: -Received IV feraheme x 2 doses on 05/04/2020 and 05/11/2020 -Received IV venofer x 5 doses on 01/04/2021-02/01/2021 -Received IV venofer x 5 doses on 03/21/2022-04/18/2022.  -Received IV venofer x 5 doses on 06/22/2022-07/20/2022.  -Received IV venofer x 2 doses on 08/14/2022-08/21/2022.   HISTORY OF PRESENTING ILLNESS:   [name] 42 y.o. female returns for a follow up for iron deficiency anemia. She is unaccompanied for this visit.  Ms. Armentrout reports she continues to have persistent fatigue that is unchanged with the iron infusions. The fatigue does impact her ADLs. She reports her appetite and weight are stable. She reports nausea without vomiting. Her bowel habits are similar with chronic loose stools and diarrhea. She denies any signs of overt bleeding including hematochezia. She is compliant with the medications prescribed by her GI team. She denies any signs of bleeding or bruising. She denies any fevers, chills, night sweats, chest pain or cough. She has no other complaints.   Rest of the pertinent 10 point ROS reviewed and negative.  REVIEW OF SYSTEMS:   Constitutional: Denies fevers, chills or abnormal night sweats Eyes: Denies blurriness of vision, double vision or watery eyes Ears, nose, mouth, throat, and face: Denies mucositis or sore throat Respiratory: Denies cough, dyspnea or wheezes Cardiovascular: Denies palpitation, chest discomfort or lower extremity swelling Gastrointestinal:  +Chronic nausea intermittently. +Diarrhea Skin: Denies abnormal skin rashes Lymphatics: Denies new lymphadenopathy or easy bruising Neurological:Denies numbness, tingling or new weaknesses Behavioral/Psych: Mood is stable, no new changes   All other systems were reviewed with the  patient and are negative.  MEDICAL HISTORY:  Past Medical History:  Diagnosis Date   ADHD (attention deficit hyperactivity disorder)    Allergy    Anemia    Anxiety    Arthritis, lumbar spine    Bipolar disorder (HCC)    Chronic back pain    Chronic headaches    Chronic interstitial cystitis    "removed my bladder when I went to end stage IC"   Depression    Disc degeneration, lumbar 01/10/2022   Fibromyalgia    GERD (gastroesophageal reflux disease)    Gestational diabetes    resolved   History of blood transfusion 02/2010   "related to OR"   History of kidney stones    HLD (hyperlipidemia)    Hypothyroid    IBS (irritable colon syndrome)    IDA (iron deficiency anemia)    Interstitial cystitis 06/2008   Dr. Andrey Spearman [facility]   Polycystic ovarian disease    Preeclampsia 2009   Self-catheterizes urinary bladder    "I have an [place] pouch; made a bladder using part of my small intestines; cath out of stoma in my belly button" (08/02/2015)   Small bowel obstruction (HCC) 08/03/2015   Spina bifida (HCC) 01/10/2022   Thyroid disease    Vitamin D deficiency     SURGICAL HISTORY: Past Surgical History:  Procedure Laterality Date   ABDOMINAL HYSTERECTOMY  02/2010   "left my ovaries"   APPENDECTOMY  02/2010   BLADDER REMOVAL  02/2010   and [place] Pouch   CESAREAN SECTION  2009   CHOLECYSTECTOMY N/A 05/27/2017   Procedure: LAPAROSCOPIC CHOLECYSTECTOMY;  Surgeon: Axel Filler, MD;  Location: [facility] OR;  Service: General;  Laterality: N/A;   COLONOSCOPY     ESOPHAGOGASTRODUODENOSCOPY (EGD)  WITH PROPOFOL N/A 07/25/2022   Procedure: ESOPHAGOGASTRODUODENOSCOPY (EGD) WITH PROPOFOL;  Surgeon: Benancio Deeds, MD;  Location: [facility] ENDOSCOPY;  Service: Gastroenterology;  Laterality: N/A;   HOT HEMOSTASIS N/A 07/25/2022   Procedure: HOT HEMOSTASIS (ARGON PLASMA COAGULATION/BICAP);  Surgeon: Benancio Deeds, MD;  Location: [facility] ENDOSCOPY;  Service: Gastroenterology;   Laterality: N/A;   [place] pouch  01/19/2021   Cystoscopy with bulk amid injection   LAPAROSCOPIC LYSIS OF ADHESIONS N/A 05/27/2017   Procedure: LAPAROSCOPIC LYSIS OF ADHESIONS;  Surgeon: Axel Filler, MD;  Location: [facility] OR;  Service: General;  Laterality: N/A;   OTHER SURGICAL HISTORY     remval of urethra when bladder was removed   SMALL INTESTINE SURGERY  2017   for bowel obstruction   SPINAL CORD STIMULATOR IMPLANT  09/2009; ~ 04/2015   SPINAL CORD STIMULATOR REMOVAL  ~ 04/2015   UPPER GI ENDOSCOPY     WISDOM TOOTH EXTRACTION      SOCIAL HISTORY: Social History   Socioeconomic History   Marital status: Married    Spouse name: Not on file   Number of children: 1   Years of education: Not on file   Highest education level: Not on file  Occupational History   Occupation: Disabled  Tobacco Use   Smoking status: Never   Smokeless tobacco: Never  Vaping Use   Vaping Use: Never used  Substance and Sexual Activity   Alcohol use: Yes    Comment: 08/03/2015 "glass of wine maybe twice/month"   Drug use: No   Sexual activity: Yes    Comment: hysterectomy  Other Topics Concern   Not on file  Social History Narrative   Not on file   Social Determinants of Health   Financial Resource Strain: Not on file  Food Insecurity: Not on file  Transportation Needs: Not on file  Physical Activity: Not on file  Stress: Not on file  Social Connections: Not on file  Intimate Partner Violence: Not on file    FAMILY HISTORY: Family History  Problem Relation Age of Onset   Lung cancer Mother        smoker   Other Father        benigh tumor between heart and lungs, had surgery and something went wrong, he died at 65   Diabetes Paternal Grandmother    Heart disease Paternal Grandmother    Diabetes Paternal Grandfather    Heart disease Paternal Grandfather    Pancreatic cancer Paternal Grandfather    Liver cancer Paternal Grandfather    Breast cancer Maternal Aunt    Colon cancer  Neg Hx    Esophageal cancer Neg Hx    Prostate cancer Neg Hx    Rectal cancer Neg Hx    Stomach cancer Neg Hx     ALLERGIES:  is allergic to keflex [cephalexin], penicillins, and sulfa antibiotics.  MEDICATIONS:  Current Outpatient Medications  Medication Sig Dispense Refill   acetaminophen (TYLENOL) 650 MG CR tablet Take 1,300 mg by mouth 2 (two) times daily.     amantadine (SYMMETREL) 100 MG capsule Take 100 mg by mouth 2 (two) times daily.     amphetamine-dextroamphetamine (ADDERALL) 30 MG tablet Take 30 mg by mouth 2 (two) times daily.     carbamazepine (TEGRETOL XR) 200 MG 12 hr tablet Take 200 mg by mouth at bedtime. Taking 1/2 tab PO at HS     clonazePAM (KLONOPIN) 2 MG tablet Take 1 mg by mouth 3 (three) times daily as needed for  anxiety.     D3-50 1.25 MG (50000 UT) capsule Take 50,000 Units by mouth 3 (three) times a week.     dicyclomine (BENTYL) 10 MG capsule TAKE 1 CAPSULE BY MOUTH TWICE A DAY 180 capsule 1   diphenoxylate-atropine (LOMOTIL) 2.5-0.025 MG tablet Take 1 tablet by mouth 4 (four) times daily as needed for diarrhea or loose stools (IBS). 120 tablet 2   fenofibrate 160 MG tablet Take 160 mg by mouth at bedtime.     icosapent Ethyl (VASCEPA) 1 g capsule Take 2 g by mouth 2 (two) times daily.     lamoTRIgine (LAMICTAL) 200 MG tablet Take 400 mg by mouth at bedtime.     levonorgestrel-ethinyl estradiol (SEASONALE,INTROVALE,JOLESSA) 0.15-0.03 MG tablet Take 1 tablet by mouth in the morning.     levothyroxine (SYNTHROID) 125 MCG tablet Take 125 mcg by mouth daily before breakfast.     lithium carbonate (ESKALITH) 450 MG ER tablet Take 675 mg by mouth at bedtime.     lumateperone tosylate (CAPLYTA) 42 MG capsule Take 42 mg by mouth at bedtime.     metoprolol succinate (TOPROL-XL) 100 MG 24 hr tablet Take 100 mg by mouth at bedtime.     nitrofurantoin (MACRODANTIN) 50 MG capsule Take 50 mg by mouth at bedtime.     nystatin cream (MYCOSTATIN) Apply 1 Application topically  daily as needed (vaginal itching).     omeprazole (PRILOSEC) 40 MG capsule Take 1 capsule (40 mg total) by mouth 2 (two) times daily. 180 capsule 1   ondansetron (ZOFRAN) 4 MG tablet Take 2-4 mg by mouth every 8 (eight) hours as needed for vomiting or nausea.     oxybutynin (DITROPAN) 5 MG/5ML syrup 5 mg 2 (two) times daily as needed. Per [place] pouch     ramelteon (ROZEREM) 8 MG tablet Take 1 tablet by mouth at bedtime.     rOPINIRole (REQUIP) 2 MG tablet Take 2 mg by mouth at bedtime.     rosuvastatin (CRESTOR) 5 MG tablet Take 1 tablet by mouth at bedtime.     sucralfate (CARAFATE) 1 g tablet TAKE 1 TABLET (1 G TOTAL) BY MOUTH EVERY 6 (SIX) HOURS AS NEEDED. 90 tablet 1   telmisartan (MICARDIS) 40 MG tablet Take 40 mg by mouth at bedtime.     tiZANidine (ZANAFLEX) 4 MG tablet Take 8 mg by mouth 2 (two) times daily.     triamcinolone cream (KENALOG) 0.1 % Apply 1 Application topically daily as needed (itching).     valbenazine (INGREZZA) 80 MG capsule Take 80 mg by mouth at bedtime.     No current facility-administered medications for this visit.     PHYSICAL EXAMINATION:  ECOG PERFORMANCE STATUS: 1 - Symptomatic but completely ambulatory  Vitals:   09/05/22 0955  BP: (!) 151/81  Pulse: 79  Resp: 20  Temp: 98.2 F (36.8 C)  SpO2: 100%   Filed Weights   09/05/22 0955  Weight: 239 lb 11.2 oz (108.7 kg)    GENERAL:alert, no distress and comfortable SKIN:, .  skin color, texture, turgor are normal, no rashes or significant lesions. Noticeable hair thinning on scalp.  EYES: normal, conjunctiva are pink and non-injected, sclera clear LUNGS: clear to auscultation and percussion with normal breathing effort HEART: regular rate & rhythm and no murmurs and no lower extremity edema Musculoskeletal:no cyanosis of digits and no clubbing  PSYCH: alert & oriented x 3 with fluent speech NEURO: no focal motor/sensory deficits  LABORATORY DATA:  I have reviewed  the data as listed Lab  Results  Component Value Date   WBC 9.1 09/05/2022   HGB 10.5 (L) 09/05/2022   HCT 36.4 09/05/2022   MCV 83.1 09/05/2022   PLT 517 (H) 09/05/2022     Chemistry      Component Value Date/Time   NA 138 07/24/2022 0954   K 4.3 07/24/2022 0954   CL 106 07/24/2022 0954   CO2 23 07/24/2022 0954   BUN 15 07/24/2022 0954   CREATININE 0.82 07/24/2022 0954      Component Value Date/Time   CALCIUM 9.7 07/24/2022 0954   ALKPHOS 32 (L) 07/24/2022 0954   AST 21 07/24/2022 0954   ALT 20 07/24/2022 0954   BILITOT 0.4 07/24/2022 0954      RADIOGRAPHIC STUDIES: I have personally reviewed the radiological images as listed and agreed with the findings in the report. No results found.   ASSESSMENT & PLAN:  [name] is a 42 y.o. female who returns for follow-up for iron deficiency anemia.    #Iron deficiency anemia: --Unable to tolerate oral iron due to GI intolerance --Underwent EGD on 07/25/22. Findings suggest mild gastric vacular ectasia which was quite friable and underlying source of IDA. Treated with APC.  --Under the care of Dr. Adela Lank in GI --Has received several rounds of IV venofer, most recently received x2 doses on 08/14/22 and 08/21/22 without a robust response to her iron and Hgb levels.  --We will request IV monoferric 1000 mg x 1 dose to bolster iron and Hgb levels.  --Labs today shows anemia with Hgb 10.5 MCV 83.1, Plt 517. Iron panel shows deficiency with serum iron 31, TIBC 602, saturation 5%, ferritin pending --RTC for monthly labs x 3 and follow up in 3 months.   #Hair Loss: --Etiologies include IDA, stress, hormone changes, etc --Referral placed for dermatology to address any treatment options.    All questions were answered. The patient knows to call the clinic with any problems, questions or concerns.  I have spent a total of 30 minutes minutes of face-to-face and non-face-to-face time, preparing to see the patient,  performing a medically appropriate  examination, counseling and educating the patient, ordering meds,  documenting clinical information in the electronic health record, and care coordination.    Briant Cedar, PA-C Hematology and Oncology [facility] at [facility]

## 2022-09-06 ENCOUNTER — Encounter: Payer: Self-pay | Admitting: Physician Assistant

## 2022-09-07 DIAGNOSIS — R339 Retention of urine, unspecified: Secondary | ICD-10-CM | POA: Diagnosis not present

## 2022-09-10 ENCOUNTER — Telehealth: Payer: Self-pay

## 2022-09-10 NOTE — Telephone Encounter (Signed)
Pt advised with VU and agreed to this plan 

## 2022-09-10 NOTE — Telephone Encounter (Signed)
-----   Message from Briant Cedar, PA-C sent at 09/09/2022  3:45 PM EDT ----- Please notify patient that labs confirm iron deficiency. As dicussed during visit, we will try to obtain IV monoferric through patient assistance since Iv venofer has not improved her iron levels significantly.  [address] team will contact her for infusions once authorized.  ----- Message ----- From: Leory Plowman, Lab In [place] Sent: 09/05/2022   9:02 AM EDT To: Briant Cedar, PA-C

## 2022-09-11 ENCOUNTER — Telehealth: Payer: Self-pay | Admitting: Pharmacy Technician

## 2022-09-11 DIAGNOSIS — F5101 Primary insomnia: Secondary | ICD-10-CM | POA: Diagnosis not present

## 2022-09-11 DIAGNOSIS — F3181 Bipolar II disorder: Secondary | ICD-10-CM | POA: Diagnosis not present

## 2022-09-11 DIAGNOSIS — F4312 Post-traumatic stress disorder, chronic: Secondary | ICD-10-CM | POA: Diagnosis not present

## 2022-09-11 DIAGNOSIS — F410 Panic disorder [episodic paroxysmal anxiety] without agoraphobia: Secondary | ICD-10-CM | POA: Diagnosis not present

## 2022-09-11 NOTE — Telephone Encounter (Addendum)
 Auth Submission: no auth needed PRE-DETERMINATION PENDING - URGENT REQUEST   Site of care: Site of care: [facility] Payer: [employer]BS [place] Medication & CPT/J Code(s) submitted: Monoferric  (Ferrci derisomaltose) [phone] Route of submission (phone, fax, portal):  Phone #[phone] Fax #[phone] Auth type: Buy/Bill Units/visits requested: 1 Reference number:  Approval from: 09/14/22 to  01/15/23  1st verify: 09/11/22 2nd verify 09/14/22: [place]-[facility] 3:18p

## 2022-09-13 ENCOUNTER — Encounter: Payer: Self-pay | Admitting: Gastroenterology

## 2022-09-14 ENCOUNTER — Encounter: Payer: Self-pay | Admitting: Physician Assistant

## 2022-09-18 ENCOUNTER — Other Ambulatory Visit: Payer: Self-pay | Admitting: Obstetrics & Gynecology

## 2022-09-18 ENCOUNTER — Ambulatory Visit: Payer: BC Managed Care – PPO | Admitting: Gastroenterology

## 2022-09-18 DIAGNOSIS — N644 Mastodynia: Secondary | ICD-10-CM

## 2022-09-24 ENCOUNTER — Ambulatory Visit (INDEPENDENT_AMBULATORY_CARE_PROVIDER_SITE_OTHER): Payer: BC Managed Care – PPO

## 2022-09-24 VITALS — BP 142/89 | HR 71 | Temp 98.9°F | Resp 16 | Ht 66.0 in | Wt 241.4 lb

## 2022-09-24 DIAGNOSIS — E611 Iron deficiency: Secondary | ICD-10-CM

## 2022-09-24 DIAGNOSIS — D500 Iron deficiency anemia secondary to blood loss (chronic): Secondary | ICD-10-CM

## 2022-09-24 MED ORDER — SODIUM CHLORIDE 0.9 % IV SOLN
1000.0000 mg | Freq: Once | INTRAVENOUS | Status: AC
  Administered 2022-09-24: 1000 mg via INTRAVENOUS
  Filled 2022-09-24: qty 10

## 2022-09-24 MED ORDER — DIPHENHYDRAMINE HCL 25 MG PO CAPS: 25.0000 mg | ORAL_CAPSULE | Freq: Once | ORAL | Status: AC

## 2022-09-24 MED ORDER — ACETAMINOPHEN 325 MG PO TABS: 650.0000 mg | ORAL_TABLET | Freq: Once | ORAL | Status: AC

## 2022-09-24 NOTE — Progress Notes (Signed)
Diagnosis: Iron Deficiency Anemia  Provider:  Chilton Greathouse MD  Procedure: IV Infusion  IV Type: Peripheral, IV Location: L Antecubital  Monoferric (Ferric Derisomaltose), Dose: 1000 mg  Infusion Start Time: 1419  Infusion Stop Time: 1442  Post Infusion IV Care: Observation period completed and Peripheral IV Discontinued  Discharge: Condition: Good, Destination: Home . AVS Declined  Performed by:  Wyvonne Lenz, RN

## 2022-09-25 DIAGNOSIS — F3181 Bipolar II disorder: Secondary | ICD-10-CM | POA: Diagnosis not present

## 2022-09-25 DIAGNOSIS — F4312 Post-traumatic stress disorder, chronic: Secondary | ICD-10-CM | POA: Diagnosis not present

## 2022-09-25 DIAGNOSIS — F5101 Primary insomnia: Secondary | ICD-10-CM | POA: Diagnosis not present

## 2022-09-25 DIAGNOSIS — F410 Panic disorder [episodic paroxysmal anxiety] without agoraphobia: Secondary | ICD-10-CM | POA: Diagnosis not present

## 2022-09-27 ENCOUNTER — Ambulatory Visit
Admission: RE | Admit: 2022-09-27 | Discharge: 2022-09-27 | Disposition: A | Discharge status: Home / Self Care | Payer: BC Managed Care – PPO | Source: Ambulatory Visit | Attending: Obstetrics & Gynecology | Admitting: Obstetrics & Gynecology

## 2022-09-27 DIAGNOSIS — N644 Mastodynia: Secondary | ICD-10-CM

## 2022-10-03 ENCOUNTER — Other Ambulatory Visit: Payer: Self-pay

## 2022-10-03 ENCOUNTER — Inpatient Hospital Stay: Payer: BC Managed Care – PPO

## 2022-10-03 DIAGNOSIS — D509 Iron deficiency anemia, unspecified: Secondary | ICD-10-CM

## 2022-10-03 DIAGNOSIS — Z803 Family history of malignant neoplasm of breast: Secondary | ICD-10-CM | POA: Diagnosis not present

## 2022-10-03 DIAGNOSIS — L659 Nonscarring hair loss, unspecified: Secondary | ICD-10-CM | POA: Diagnosis not present

## 2022-10-03 DIAGNOSIS — Z801 Family history of malignant neoplasm of trachea, bronchus and lung: Secondary | ICD-10-CM | POA: Diagnosis not present

## 2022-10-03 DIAGNOSIS — Z79899 Other long term (current) drug therapy: Secondary | ICD-10-CM | POA: Diagnosis not present

## 2022-10-03 DIAGNOSIS — Z808 Family history of malignant neoplasm of other organs or systems: Secondary | ICD-10-CM | POA: Diagnosis not present

## 2022-10-03 DIAGNOSIS — Z800 Family history of malignant neoplasm of digestive organs: Secondary | ICD-10-CM | POA: Diagnosis not present

## 2022-10-03 LAB — CBC WITH DIFFERENTIAL (CANCER CENTER ONLY)
Abs Immature Granulocytes: 0.15 K/uL — ABNORMAL HIGH (ref 0.00–0.07)
Basophils Absolute: 0.1 K/uL (ref 0.0–0.1)
Basophils Relative: 1 %
Eosinophils Absolute: 0.4 K/uL (ref 0.0–0.5)
Eosinophils Relative: 4 %
HCT: 36.8 % (ref 36.0–46.0)
Hemoglobin: 11.3 g/dL — ABNORMAL LOW (ref 12.0–15.0)
Immature Granulocytes: 1 %
Lymphocytes Relative: 21 %
Lymphs Abs: 2.3 K/uL (ref 0.7–4.0)
MCH: 25.6 pg — ABNORMAL LOW (ref 26.0–34.0)
MCHC: 30.7 g/dL (ref 30.0–36.0)
MCV: 83.4 fL (ref 80.0–100.0)
Monocytes Absolute: 0.7 K/uL (ref 0.1–1.0)
Monocytes Relative: 7 %
Neutro Abs: 7.2 K/uL (ref 1.7–7.7)
Neutrophils Relative %: 66 %
Platelet Count: 501 K/uL — ABNORMAL HIGH (ref 150–400)
RBC: 4.41 MIL/uL (ref 3.87–5.11)
RDW: 23.4 % — ABNORMAL HIGH (ref 11.5–15.5)
WBC Count: 10.9 K/uL — ABNORMAL HIGH (ref 4.0–10.5)
nRBC: 0 % (ref 0.0–0.2)

## 2022-10-03 LAB — IRON AND IRON BINDING CAPACITY (CC-WL,HP ONLY)
Iron: 86 ug/dL (ref 28–170)
Saturation Ratios: 15 % (ref 10.4–31.8)
TIBC: 559 ug/dL — ABNORMAL HIGH (ref 250–450)
UIBC: 473 ug/dL — ABNORMAL HIGH (ref 148–442)

## 2022-10-03 LAB — FERRITIN: Ferritin: 330 ng/mL — ABNORMAL HIGH (ref 11–307)

## 2022-10-04 DIAGNOSIS — F4312 Post-traumatic stress disorder, chronic: Secondary | ICD-10-CM | POA: Diagnosis not present

## 2022-10-04 DIAGNOSIS — F3181 Bipolar II disorder: Secondary | ICD-10-CM | POA: Diagnosis not present

## 2022-10-04 DIAGNOSIS — F5101 Primary insomnia: Secondary | ICD-10-CM | POA: Diagnosis not present

## 2022-10-04 DIAGNOSIS — F410 Panic disorder [episodic paroxysmal anxiety] without agoraphobia: Secondary | ICD-10-CM | POA: Diagnosis not present

## 2022-10-09 DIAGNOSIS — R339 Retention of urine, unspecified: Secondary | ICD-10-CM | POA: Diagnosis not present

## 2022-10-10 DIAGNOSIS — E781 Pure hyperglyceridemia: Secondary | ICD-10-CM | POA: Diagnosis not present

## 2022-10-10 DIAGNOSIS — I10 Essential (primary) hypertension: Secondary | ICD-10-CM | POA: Diagnosis not present

## 2022-10-12 DIAGNOSIS — F3181 Bipolar II disorder: Secondary | ICD-10-CM | POA: Diagnosis not present

## 2022-10-12 DIAGNOSIS — F5101 Primary insomnia: Secondary | ICD-10-CM | POA: Diagnosis not present

## 2022-10-12 DIAGNOSIS — F410 Panic disorder [episodic paroxysmal anxiety] without agoraphobia: Secondary | ICD-10-CM | POA: Diagnosis not present

## 2022-10-12 DIAGNOSIS — F4312 Post-traumatic stress disorder, chronic: Secondary | ICD-10-CM | POA: Diagnosis not present

## 2022-10-15 DIAGNOSIS — I10 Essential (primary) hypertension: Secondary | ICD-10-CM | POA: Diagnosis not present

## 2022-10-15 DIAGNOSIS — E1169 Type 2 diabetes mellitus with other specified complication: Secondary | ICD-10-CM | POA: Diagnosis not present

## 2022-10-16 DIAGNOSIS — F3181 Bipolar II disorder: Secondary | ICD-10-CM | POA: Diagnosis not present

## 2022-10-16 DIAGNOSIS — F5101 Primary insomnia: Secondary | ICD-10-CM | POA: Diagnosis not present

## 2022-10-16 DIAGNOSIS — F4312 Post-traumatic stress disorder, chronic: Secondary | ICD-10-CM | POA: Diagnosis not present

## 2022-10-16 DIAGNOSIS — F410 Panic disorder [episodic paroxysmal anxiety] without agoraphobia: Secondary | ICD-10-CM | POA: Diagnosis not present

## 2022-10-21 ENCOUNTER — Encounter: Payer: Self-pay | Admitting: Gastroenterology

## 2022-10-22 ENCOUNTER — Other Ambulatory Visit: Payer: Self-pay | Admitting: Obstetrics & Gynecology

## 2022-10-22 DIAGNOSIS — Z1231 Encounter for screening mammogram for malignant neoplasm of breast: Secondary | ICD-10-CM

## 2022-10-23 DIAGNOSIS — E1169 Type 2 diabetes mellitus with other specified complication: Secondary | ICD-10-CM | POA: Diagnosis not present

## 2022-10-24 DIAGNOSIS — F4312 Post-traumatic stress disorder, chronic: Secondary | ICD-10-CM | POA: Diagnosis not present

## 2022-10-24 DIAGNOSIS — F410 Panic disorder [episodic paroxysmal anxiety] without agoraphobia: Secondary | ICD-10-CM | POA: Diagnosis not present

## 2022-10-24 DIAGNOSIS — F5101 Primary insomnia: Secondary | ICD-10-CM | POA: Diagnosis not present

## 2022-10-24 DIAGNOSIS — F3181 Bipolar II disorder: Secondary | ICD-10-CM | POA: Diagnosis not present

## 2022-10-29 ENCOUNTER — Encounter: Payer: Self-pay | Admitting: Physician Assistant

## 2022-10-31 ENCOUNTER — Inpatient Hospital Stay: Payer: BC Managed Care – PPO | Attending: Physician Assistant

## 2022-10-31 DIAGNOSIS — D509 Iron deficiency anemia, unspecified: Secondary | ICD-10-CM | POA: Diagnosis not present

## 2022-10-31 LAB — CBC WITH DIFFERENTIAL (CANCER CENTER ONLY)
Abs Immature Granulocytes: 0.02 10*3/uL (ref 0.00–0.07)
Basophils Absolute: 0.1 10*3/uL (ref 0.0–0.1)
Basophils Relative: 1 %
Eosinophils Absolute: 0.2 10*3/uL (ref 0.0–0.5)
Eosinophils Relative: 3 %
HCT: 40.2 % (ref 36.0–46.0)
Hemoglobin: 12.1 g/dL (ref 12.0–15.0)
Immature Granulocytes: 0 %
Lymphocytes Relative: 19 %
Lymphs Abs: 1.4 10*3/uL (ref 0.7–4.0)
MCH: 26.1 pg (ref 26.0–34.0)
MCHC: 30.1 g/dL (ref 30.0–36.0)
MCV: 86.8 fL (ref 80.0–100.0)
Monocytes Absolute: 0.5 10*3/uL (ref 0.1–1.0)
Monocytes Relative: 7 %
Neutro Abs: 5.2 10*3/uL (ref 1.7–7.7)
Neutrophils Relative %: 70 %
Platelet Count: 515 10*3/uL — ABNORMAL HIGH (ref 150–400)
RBC: 4.63 MIL/uL (ref 3.87–5.11)
RDW: 20.3 % — ABNORMAL HIGH (ref 11.5–15.5)
WBC Count: 7.4 10*3/uL (ref 4.0–10.5)
nRBC: 0 % (ref 0.0–0.2)

## 2022-10-31 LAB — FERRITIN: Ferritin: 42 ng/mL (ref 11–307)

## 2022-11-01 ENCOUNTER — Other Ambulatory Visit: Payer: Self-pay | Admitting: Physician Assistant

## 2022-11-01 LAB — IRON AND IRON BINDING CAPACITY (CC-WL,HP ONLY)
Iron: 50 ug/dL (ref 28–170)
Saturation Ratios: 9 % — ABNORMAL LOW (ref 10.4–31.8)
TIBC: 567 ug/dL — ABNORMAL HIGH (ref 250–450)
UIBC: 517 ug/dL

## 2022-11-08 DIAGNOSIS — R339 Retention of urine, unspecified: Secondary | ICD-10-CM | POA: Diagnosis not present

## 2022-11-09 DIAGNOSIS — F3181 Bipolar II disorder: Secondary | ICD-10-CM | POA: Diagnosis not present

## 2022-11-09 DIAGNOSIS — F5101 Primary insomnia: Secondary | ICD-10-CM | POA: Diagnosis not present

## 2022-11-09 DIAGNOSIS — F4312 Post-traumatic stress disorder, chronic: Secondary | ICD-10-CM | POA: Diagnosis not present

## 2022-11-09 DIAGNOSIS — F410 Panic disorder [episodic paroxysmal anxiety] without agoraphobia: Secondary | ICD-10-CM | POA: Diagnosis not present

## 2022-11-13 ENCOUNTER — Ambulatory Visit (INDEPENDENT_AMBULATORY_CARE_PROVIDER_SITE_OTHER): Payer: BC Managed Care – PPO | Admitting: *Deleted

## 2022-11-13 VITALS — BP 139/86 | HR 80 | Temp 98.3°F | Resp 16 | Ht 66.0 in | Wt 237.2 lb

## 2022-11-13 DIAGNOSIS — D509 Iron deficiency anemia, unspecified: Secondary | ICD-10-CM

## 2022-11-13 DIAGNOSIS — E611 Iron deficiency: Secondary | ICD-10-CM

## 2022-11-13 DIAGNOSIS — D500 Iron deficiency anemia secondary to blood loss (chronic): Secondary | ICD-10-CM

## 2022-11-13 MED ORDER — SODIUM CHLORIDE 0.9 % IV SOLN
1000.0000 mg | Freq: Once | INTRAVENOUS | Status: AC
  Administered 2022-11-13: 1000 mg via INTRAVENOUS
  Filled 2022-11-13: qty 10

## 2022-11-13 NOTE — Progress Notes (Signed)
Diagnosis: Iron Deficiency Anemia  Provider:  Chilton Greathouse MD  Procedure: IV Infusion  IV Type: Peripheral, IV Location: R Antecubital  Monoferric (Ferric Derisomaltose), Dose: 1000 mg  Infusion Start Time: 1055 am  Infusion Stop Time: 1125 am  Post Infusion IV Care: Observation period completed and Peripheral IV Discontinued  Discharge: Condition: Good, Destination: Home . AVS Declined  Performed by:  Forrest Moron, RN

## 2022-11-14 ENCOUNTER — Encounter: Payer: Self-pay | Admitting: Gastroenterology

## 2022-11-15 DIAGNOSIS — F3181 Bipolar II disorder: Secondary | ICD-10-CM | POA: Diagnosis not present

## 2022-11-15 DIAGNOSIS — F4312 Post-traumatic stress disorder, chronic: Secondary | ICD-10-CM | POA: Diagnosis not present

## 2022-11-15 DIAGNOSIS — F410 Panic disorder [episodic paroxysmal anxiety] without agoraphobia: Secondary | ICD-10-CM | POA: Diagnosis not present

## 2022-11-15 DIAGNOSIS — F5101 Primary insomnia: Secondary | ICD-10-CM | POA: Diagnosis not present

## 2022-11-19 DIAGNOSIS — F410 Panic disorder [episodic paroxysmal anxiety] without agoraphobia: Secondary | ICD-10-CM | POA: Diagnosis not present

## 2022-11-19 DIAGNOSIS — F4312 Post-traumatic stress disorder, chronic: Secondary | ICD-10-CM | POA: Diagnosis not present

## 2022-11-19 DIAGNOSIS — F3181 Bipolar II disorder: Secondary | ICD-10-CM | POA: Diagnosis not present

## 2022-11-19 DIAGNOSIS — F5101 Primary insomnia: Secondary | ICD-10-CM | POA: Diagnosis not present

## 2022-11-20 ENCOUNTER — Ambulatory Visit: Payer: BC Managed Care – PPO

## 2022-11-23 ENCOUNTER — Ambulatory Visit
Admission: RE | Admit: 2022-11-23 | Discharge: 2022-11-23 | Disposition: A | Discharge status: Home / Self Care | Payer: BC Managed Care – PPO | Source: Ambulatory Visit | Attending: Obstetrics & Gynecology | Admitting: Obstetrics & Gynecology

## 2022-11-23 DIAGNOSIS — Z1231 Encounter for screening mammogram for malignant neoplasm of breast: Secondary | ICD-10-CM

## 2022-11-26 DIAGNOSIS — F5101 Primary insomnia: Secondary | ICD-10-CM | POA: Diagnosis not present

## 2022-11-26 DIAGNOSIS — F3181 Bipolar II disorder: Secondary | ICD-10-CM | POA: Diagnosis not present

## 2022-11-26 DIAGNOSIS — F410 Panic disorder [episodic paroxysmal anxiety] without agoraphobia: Secondary | ICD-10-CM | POA: Diagnosis not present

## 2022-11-26 DIAGNOSIS — F4312 Post-traumatic stress disorder, chronic: Secondary | ICD-10-CM | POA: Diagnosis not present

## 2022-12-03 ENCOUNTER — Other Ambulatory Visit: Payer: Self-pay | Admitting: Physician Assistant

## 2022-12-03 DIAGNOSIS — F3181 Bipolar II disorder: Secondary | ICD-10-CM | POA: Diagnosis not present

## 2022-12-03 DIAGNOSIS — F4312 Post-traumatic stress disorder, chronic: Secondary | ICD-10-CM | POA: Diagnosis not present

## 2022-12-03 DIAGNOSIS — F410 Panic disorder [episodic paroxysmal anxiety] without agoraphobia: Secondary | ICD-10-CM | POA: Diagnosis not present

## 2022-12-03 DIAGNOSIS — D509 Iron deficiency anemia, unspecified: Secondary | ICD-10-CM

## 2022-12-03 DIAGNOSIS — F5101 Primary insomnia: Secondary | ICD-10-CM | POA: Diagnosis not present

## 2022-12-04 ENCOUNTER — Telehealth: Payer: Self-pay

## 2022-12-04 ENCOUNTER — Inpatient Hospital Stay: Payer: BC Managed Care – PPO | Attending: Physician Assistant

## 2022-12-04 ENCOUNTER — Inpatient Hospital Stay (HOSPITAL_BASED_OUTPATIENT_CLINIC_OR_DEPARTMENT_OTHER): Payer: BC Managed Care – PPO | Admitting: Physician Assistant

## 2022-12-04 VITALS — BP 146/85 | HR 79 | Temp 97.5°F | Resp 18 | Wt 233.3 lb

## 2022-12-04 DIAGNOSIS — Z79899 Other long term (current) drug therapy: Secondary | ICD-10-CM | POA: Diagnosis not present

## 2022-12-04 DIAGNOSIS — D509 Iron deficiency anemia, unspecified: Secondary | ICD-10-CM | POA: Diagnosis not present

## 2022-12-04 DIAGNOSIS — Z801 Family history of malignant neoplasm of trachea, bronchus and lung: Secondary | ICD-10-CM | POA: Diagnosis not present

## 2022-12-04 DIAGNOSIS — Z800 Family history of malignant neoplasm of digestive organs: Secondary | ICD-10-CM | POA: Insufficient documentation

## 2022-12-04 DIAGNOSIS — Z803 Family history of malignant neoplasm of breast: Secondary | ICD-10-CM | POA: Insufficient documentation

## 2022-12-04 LAB — IRON AND IRON BINDING CAPACITY (CC-WL,HP ONLY)
Iron: 91 ug/dL (ref 28–170)
Saturation Ratios: 18 % (ref 10.4–31.8)
TIBC: 512 ug/dL — ABNORMAL HIGH (ref 250–450)
UIBC: 421 ug/dL (ref 148–442)

## 2022-12-04 LAB — FERRITIN: Ferritin: 254 ng/mL (ref 11–307)

## 2022-12-04 LAB — CBC WITH DIFFERENTIAL (CANCER CENTER ONLY)
Abs Immature Granulocytes: 0.04 10*3/uL (ref 0.00–0.07)
Basophils Absolute: 0.1 10*3/uL (ref 0.0–0.1)
Basophils Relative: 1 %
Eosinophils Absolute: 0.4 10*3/uL (ref 0.0–0.5)
Eosinophils Relative: 4 %
HCT: 44 % (ref 36.0–46.0)
Hemoglobin: 13.5 g/dL (ref 12.0–15.0)
Immature Granulocytes: 1 %
Lymphocytes Relative: 20 %
Lymphs Abs: 1.7 10*3/uL (ref 0.7–4.0)
MCH: 27.2 pg (ref 26.0–34.0)
MCHC: 30.7 g/dL (ref 30.0–36.0)
MCV: 88.7 fL (ref 80.0–100.0)
Monocytes Absolute: 0.5 10*3/uL (ref 0.1–1.0)
Monocytes Relative: 6 %
Neutro Abs: 5.7 10*3/uL (ref 1.7–7.7)
Neutrophils Relative %: 68 %
Platelet Count: 410 10*3/uL — ABNORMAL HIGH (ref 150–400)
RBC: 4.96 MIL/uL (ref 3.87–5.11)
RDW: 18.3 % — ABNORMAL HIGH (ref 11.5–15.5)
WBC Count: 8.4 10*3/uL (ref 4.0–10.5)
nRBC: 0 % (ref 0.0–0.2)

## 2022-12-04 LAB — PHOSPHORUS: Phosphorus: 3.2 mg/dL (ref 2.5–4.6)

## 2022-12-04 NOTE — Telephone Encounter (Signed)
-----   Message from [name] sent at 12/04/2022  3:24 PM EDT ----- Please notify patient that iron levels have improve and no need for additional IV iron at this time.

## 2022-12-04 NOTE — Telephone Encounter (Signed)
Pt advised with VU 

## 2022-12-04 NOTE — Progress Notes (Signed)
[facility] [phone]   [phone]  PROGRESS NOTE  Patient Care Team: Chilton Greathouse, MD as PCP - General (Internal Medicine)  CHIEF COMPLAINTS/PURPOSE OF CONSULTATION:  Iron deficiency anemia  TREATMENT HISTORY: -Received IV feraheme x 2 doses on 05/04/2020 and 05/11/2020 -Received IV venofer x 5 doses on 01/04/2021-02/01/2021 -Received IV venofer x 5 doses on 03/21/2022-04/18/2022.  -Received IV venofer x 5 doses on 06/22/2022-07/20/2022.  -Received IV venofer x 2 doses on 08/14/2022-08/21/2022.  -Received IV monoferric x 1 dose on 09/24/2022 -Received IV monoferric x 1 dose on 11/13/2022.   HISTORY OF PRESENTING ILLNESS:   Discussed the use of AI scribe software for clinical note transcription with the patient, who gave verbal consent to proceed.    [name] 42 y.o. female returns for a follow up for iron deficiency anemia. She was last seen on 09/05/2022. In the interim, she received IV monoferric 1000 mg x 2 doses. She is unaccompanied for this visit.  [name] reports persistent fatigue that worsened over the last week. She reports feeling 'off' and 'very, very tired' with 'no energy to do much of anything' a week after the infusion. In addition, she has noticed a change in urine color to a 'very dark, almost brownish' hue post-infusion.   She adds that since switching to IV monoferric, she has noticed skin peeling of her hands. The skin peeling, which started in the thumb before spreading to the entire hand, has been occurring for a few weeks. The peeling is accompanied by itchiness but no bleeding.   She reports waking up 'completely drenched in sweat' on two recent occasions, despite not feeling ill or having a fever.  She reports having restless leg syndrome in both legs which affects her sleep. She has occasional nausea without vomiting. Her bowel habits are similar with chronic loose stools and diarrhea. She denies any signs of overt  bleeding including hematochezia. She is compliant with the medications prescribed by her GI team. She denies any signs of bleeding or bruising. She denies any fevers, chills, shortness of breath, chest pain or cough. She has no other complaints.   Rest of the pertinent 10 point ROS reviewed and negative.  REVIEW OF SYSTEMS:   Constitutional: Denies fevers, chills or abnormal night sweats Eyes: Denies blurriness of vision, double vision or watery eyes Ears, nose, mouth, throat, and face: Denies mucositis or sore throat Respiratory: Denies cough, dyspnea or wheezes Cardiovascular: Denies palpitation, chest discomfort or lower extremity swelling Gastrointestinal:  +Chronic nausea intermittently. +Diarrhea Skin: Denies abnormal skin rashes Lymphatics: Denies new lymphadenopathy or easy bruising Neurological:Denies numbness, tingling or new weaknesses Behavioral/Psych: Mood is stable, no new changes   All other systems were reviewed with the patient and are negative.  MEDICAL HISTORY:  Past Medical History:  Diagnosis Date   ADHD (attention deficit hyperactivity disorder)    Allergy    Anemia    Anxiety    Arthritis, lumbar spine    Bipolar disorder (HCC)    Chronic back pain    Chronic headaches    Chronic interstitial cystitis    "removed my bladder when I went to end stage IC"   Depression    Disc degeneration, lumbar 01/10/2022   Fibromyalgia    GERD (gastroesophageal reflux disease)    Gestational diabetes    resolved   History of blood transfusion 02/2010   "related to OR"   History of kidney stones    HLD (hyperlipidemia)    Hypothyroid  IBS (irritable colon syndrome)    IDA (iron deficiency anemia)    Interstitial cystitis 06/2008   Dr. Andrey Spearman [facility]   Polycystic ovarian disease    Preeclampsia 2009   Self-catheterizes urinary bladder    "I have an [place] pouch; made a bladder using part of my small intestines; cath out of stoma in my belly button"  (08/02/2015)   Small bowel obstruction (HCC) 08/03/2015   Spina bifida (HCC) 01/10/2022   Thyroid disease    Vitamin D deficiency     SURGICAL HISTORY: Past Surgical History:  Procedure Laterality Date   ABDOMINAL HYSTERECTOMY  02/2010   "left my ovaries"   APPENDECTOMY  02/2010   BLADDER REMOVAL  02/2010   and [place] Pouch   CESAREAN SECTION  2009   CHOLECYSTECTOMY N/A 05/27/2017   Procedure: LAPAROSCOPIC CHOLECYSTECTOMY;  Surgeon: Axel Filler, MD;  Location[facility];  Service: General;  Laterality: N/A;   COLONOSCOPY     ESOPHAGOGASTRODUODENOSCOPY (EGD) WITH PROPOFOL N/A 07/25/2022   Procedure: ESOPHAGOGASTRODUODENOSCOPY (EGD) WITH PROPOFOL;  Surgeon: Benancio Deeds, MD;  Location: [facility] ENDOSCOPY;  Service: Gastroenterology;  Laterality: N/A;   HOT HEMOSTASIS N/A 07/25/2022   Procedure: HOT HEMOSTASIS (ARGON PLASMA COAGULATION/BICAP);  Surgeon: Benancio Deeds, MD;  Location[facility] ENDOSCOPY;  Service: Gastroenterology;  Laterality: N/A;   [place] pouch  01/19/2021   Cystoscopy with bulk amid injection   LAPAROSCOPIC LYSIS OF ADHESIONS N/A 05/27/2017   Procedure: LAPAROSCOPIC LYSIS OF ADHESIONS;  Surgeon: Axel Filler, MD;  Location: [facility] OR;  Service: General;  Laterality: N/A;   OTHER SURGICAL HISTORY     remval of urethra when bladder was removed   SMALL INTESTINE SURGERY  2017   for bowel obstruction   SPINAL CORD STIMULATOR IMPLANT  09/2009; ~ 04/2015   SPINAL CORD STIMULATOR REMOVAL  ~ 04/2015   UPPER GI ENDOSCOPY     WISDOM TOOTH EXTRACTION      SOCIAL HISTORY: Social History   Socioeconomic History   Marital status: Married    Spouse name: Not on file   Number of children: 1   Years of education: Not on file   Highest education level: Not on file  Occupational History   Occupation: Disabled  Tobacco Use   Smoking status: Never   Smokeless tobacco: Never  Vaping Use   Vaping status: Never Used  Substance and Sexual Activity   Alcohol use: Yes     Comment: 08/03/2015 "glass of wine maybe twice/month"   Drug use: No   Sexual activity: Yes    Comment: hysterectomy  Other Topics Concern   Not on file  Social History Narrative   Not on file   Social Determinants of Health   Financial Resource Strain: Not on file  Food Insecurity: Not on file  Transportation Needs: Not on file  Physical Activity: Not on file  Stress: Not on file  Social Connections: Unknown (07/15/2021)   [facility]   Social Network    Social Network: Not on file  Intimate Partner Violence: Unknown (06/07/2021)   Received from [employer], [facility]   HITS    Physically Hurt: Not on file    Insult or Talk Down To: Not on file    Threaten Physical Harm: Not on file    Scream or Curse: Not on file    FAMILY HISTORY: Family History  Problem Relation Age of Onset   Lung cancer Mother        smoker  Other Father        benigh tumor between heart and lungs, had surgery and something went wrong, he died at 69   Diabetes Paternal Grandmother    Heart disease Paternal Grandmother    Diabetes Paternal Grandfather    Heart disease Paternal Grandfather    Pancreatic cancer Paternal Grandfather    Liver cancer Paternal Grandfather    Breast cancer Maternal Aunt    Colon cancer Neg Hx    Esophageal cancer Neg Hx    Prostate cancer Neg Hx    Rectal cancer Neg Hx    Stomach cancer Neg Hx     ALLERGIES:  is allergic to keflex [cephalexin], penicillins, and sulfa antibiotics.  MEDICATIONS:  Current Outpatient Medications  Medication Sig Dispense Refill   acetaminophen (TYLENOL) 650 MG CR tablet Take 1,300 mg by mouth 2 (two) times daily.     amantadine (SYMMETREL) 100 MG capsule Take 100 mg by mouth 2 (two) times daily.     amphetamine-dextroamphetamine (ADDERALL) 30 MG tablet Take 30 mg by mouth 2 (two) times daily.     carbamazepine (TEGRETOL XR) 200 MG 12 hr tablet Take 200 mg by mouth at bedtime. Taking 1/2 tab PO at HS      clonazePAM (KLONOPIN) 2 MG tablet Take 1 mg by mouth 3 (three) times daily as needed for anxiety.     D3-50 1.25 MG (50000 UT) capsule Take 50,000 Units by mouth 3 (three) times a week.     dicyclomine (BENTYL) 10 MG capsule TAKE 1 CAPSULE BY MOUTH TWICE A DAY 180 capsule 1   diphenoxylate-atropine (LOMOTIL) 2.5-0.025 MG tablet Take 1 tablet by mouth 4 (four) times daily as needed for diarrhea or loose stools (IBS). 120 tablet 2   fenofibrate 160 MG tablet Take 160 mg by mouth at bedtime.     icosapent Ethyl (VASCEPA) 1 g capsule Take 2 g by mouth 2 (two) times daily.     lamoTRIgine (LAMICTAL) 200 MG tablet Take 400 mg by mouth at bedtime.     levonorgestrel-ethinyl estradiol (SEASONALE,INTROVALE,JOLESSA) 0.15-0.03 MG tablet Take 1 tablet by mouth in the morning.     levothyroxine (SYNTHROID) 125 MCG tablet Take 125 mcg by mouth daily before breakfast.     lithium carbonate (ESKALITH) 450 MG ER tablet Take 675 mg by mouth at bedtime.     lumateperone tosylate (CAPLYTA) 42 MG capsule Take 42 mg by mouth at bedtime.     metoprolol succinate (TOPROL-XL) 100 MG 24 hr tablet Take 100 mg by mouth at bedtime.     nitrofurantoin (MACRODANTIN) 50 MG capsule Take 50 mg by mouth at bedtime.     nystatin cream (MYCOSTATIN) Apply 1 Application topically daily as needed (vaginal itching).     omeprazole (PRILOSEC) 40 MG capsule Take 1 capsule (40 mg total) by mouth 2 (two) times daily. 180 capsule 1   ondansetron (ZOFRAN) 4 MG tablet Take 2-4 mg by mouth every 8 (eight) hours as needed for vomiting or nausea.     oxybutynin (DITROPAN) 5 MG/5ML syrup 5 mg 2 (two) times daily as needed. Per [place] pouch     OZEMPIC, 0.25 OR 0.5 MG/DOSE, 2 MG/3ML SOPN      ramelteon (ROZEREM) 8 MG tablet Take 1 tablet by mouth at bedtime.     rOPINIRole (REQUIP) 2 MG tablet Take 2 mg by mouth at bedtime.     rosuvastatin (CRESTOR) 5 MG tablet Take 1 tablet by mouth at bedtime.  sucralfate (CARAFATE) 1 g tablet TAKE 1  TABLET (1 G TOTAL) BY MOUTH EVERY 6 (SIX) HOURS AS NEEDED. 90 tablet 1   telmisartan (MICARDIS) 40 MG tablet Take 40 mg by mouth at bedtime.     tiZANidine (ZANAFLEX) 4 MG tablet Take 8 mg by mouth 2 (two) times daily.     triamcinolone cream (KENALOG) 0.1 % Apply 1 Application topically daily as needed (itching).     valbenazine (INGREZZA) 80 MG capsule Take 80 mg by mouth at bedtime.     Current Facility-Administered Medications  Medication Dose Route Frequency Provider Last Rate Last Admin   acetaminophen (TYLENOL) tablet 650 mg  650 mg Oral Once Nneka Blanda T, PA-C       diphenhydrAMINE (BENADRYL) capsule 25 mg  25 mg Oral Once Tricia Pledger T, PA-C         PHYSICAL EXAMINATION:  ECOG PERFORMANCE STATUS: 1 - Symptomatic but completely ambulatory  Vitals:   12/04/22 1042  BP: (!) 146/85  Pulse: 79  Resp: 18  Temp: (!) 97.5 F (36.4 C)  SpO2: 99%   Filed Weights   12/04/22 1042  Weight: 233 lb 5 oz (105.8 kg)    GENERAL:alert, no distress and comfortable SKIN:, .  skin color, texture, turgor are normal, no rashes or significant lesions. Peeling of skin of both hands.  EYES: normal, conjunctiva are pink and non-injected, sclera clear LUNGS: clear to auscultation and percussion with normal breathing effort HEART: regular rate & rhythm and no murmurs and no lower extremity edema Musculoskeletal:no cyanosis of digits and no clubbing  PSYCH: alert & oriented x 3 with fluent speech NEURO: no focal motor/sensory deficits  LABORATORY DATA:  I have reviewed the data as listed Lab Results  Component Value Date   WBC 8.4 12/04/2022   HGB 13.5 12/04/2022   HCT 44.0 12/04/2022   MCV 88.7 12/04/2022   PLT 410 (H) 12/04/2022     Chemistry      Component Value Date/Time   NA 138 07/24/2022 0954   K 4.3 07/24/2022 0954   CL 106 07/24/2022 0954   CO2 23 07/24/2022 0954   BUN 15 07/24/2022 0954   CREATININE 0.82 07/24/2022 0954      Component Value Date/Time   CALCIUM  9.7 07/24/2022 0954   ALKPHOS 32 (L) 07/24/2022 0954   AST 21 07/24/2022 0954   ALT 20 07/24/2022 0954   BILITOT 0.4 07/24/2022 0954      RADIOGRAPHIC STUDIES: I have personally reviewed the radiological images as listed and agreed with the findings in the report. MM 3D SCREENING MAMMOGRAM BILATERAL BREAST  Result Date: 11/26/2022 CLINICAL DATA:  Screening. EXAM: DIGITAL SCREENING BILATERAL MAMMOGRAM WITH TOMOSYNTHESIS AND CAD TECHNIQUE: Bilateral screening digital craniocaudal and mediolateral oblique mammograms were obtained. Bilateral screening digital breast tomosynthesis was performed. The images were evaluated with computer-aided detection. COMPARISON:  Previous exam(s). ACR Breast Density Category b: There are scattered areas of fibroglandular density. FINDINGS: There are no findings suspicious for malignancy. IMPRESSION: No mammographic evidence of malignancy. A result letter of this screening mammogram will be mailed directly to the patient. RECOMMENDATION: Screening mammogram in one year. (Code:SM-B-01Y) BI-RADS CATEGORY  1: Negative. Electronically Signed   By: Ted Mcalpine M.D.   On: 11/26/2022 10:27     ASSESSMENT & PLAN:  [name] is a 42 y.o. female who returns for follow-up for iron deficiency anemia.    #Iron deficiency anemia: --Unable to tolerate oral iron due to GI intolerance --Underwent  EGD on 07/25/22. Findings suggest mild gastric vacular ectasia which was quite friable and underlying source of IDA. Treated with APC.  --Under the care of Dr. Adela Lank in GI --Last received IV monoferric 1000 mg x 1 dose on 11/13/2022.  --Labs today show no evidence of anemia with Hgb 13.5, MCV 88.7. Iron panel shows iron 91, TIBC 512, saturation 18%, ferritin 254. --No need for IV iron at this time. --We will add pre-meds to next IV monoferric infusion to try to prevent peeling of skin.  --RTC for monthly labs x 3 and follow up in 3 months.    All questions were  answered. The patient knows to call the clinic with any problems, questions or concerns.  I have spent a total of 30 minutes minutes of face-to-face and non-face-to-face time, preparing to see the patient,  performing a medically appropriate examination, counseling and educating the patient, ordering meds,  documenting clinical information in the electronic health record, and care coordination.    Briant Cedar, PA-C Hematology and Oncology [facility] at [facility]

## 2022-12-10 DIAGNOSIS — R339 Retention of urine, unspecified: Secondary | ICD-10-CM | POA: Diagnosis not present

## 2022-12-11 DIAGNOSIS — M797 Fibromyalgia: Secondary | ICD-10-CM | POA: Diagnosis not present

## 2022-12-12 ENCOUNTER — Other Ambulatory Visit: Payer: Self-pay | Admitting: Gastroenterology

## 2022-12-20 DIAGNOSIS — F3132 Bipolar disorder, current episode depressed, moderate: Secondary | ICD-10-CM | POA: Diagnosis not present

## 2022-12-20 DIAGNOSIS — F3111 Bipolar disorder, current episode manic without psychotic features, mild: Secondary | ICD-10-CM | POA: Diagnosis not present

## 2022-12-20 DIAGNOSIS — F900 Attention-deficit hyperactivity disorder, predominantly inattentive type: Secondary | ICD-10-CM | POA: Diagnosis not present

## 2022-12-20 DIAGNOSIS — F410 Panic disorder [episodic paroxysmal anxiety] without agoraphobia: Secondary | ICD-10-CM | POA: Diagnosis not present

## 2022-12-21 DIAGNOSIS — F3181 Bipolar II disorder: Secondary | ICD-10-CM | POA: Diagnosis not present

## 2022-12-21 DIAGNOSIS — F410 Panic disorder [episodic paroxysmal anxiety] without agoraphobia: Secondary | ICD-10-CM | POA: Diagnosis not present

## 2022-12-21 DIAGNOSIS — F4312 Post-traumatic stress disorder, chronic: Secondary | ICD-10-CM | POA: Diagnosis not present

## 2022-12-21 DIAGNOSIS — F5101 Primary insomnia: Secondary | ICD-10-CM | POA: Diagnosis not present

## 2022-12-25 DIAGNOSIS — F5101 Primary insomnia: Secondary | ICD-10-CM | POA: Diagnosis not present

## 2022-12-25 DIAGNOSIS — F4312 Post-traumatic stress disorder, chronic: Secondary | ICD-10-CM | POA: Diagnosis not present

## 2022-12-25 DIAGNOSIS — F3181 Bipolar II disorder: Secondary | ICD-10-CM | POA: Diagnosis not present

## 2022-12-25 DIAGNOSIS — F410 Panic disorder [episodic paroxysmal anxiety] without agoraphobia: Secondary | ICD-10-CM | POA: Diagnosis not present

## 2022-12-27 ENCOUNTER — Encounter: Payer: Self-pay | Admitting: Dermatology

## 2022-12-27 ENCOUNTER — Ambulatory Visit: Payer: BC Managed Care – PPO | Admitting: Dermatology

## 2022-12-27 VITALS — BP 135/89 | HR 80

## 2022-12-27 DIAGNOSIS — L649 Androgenic alopecia, unspecified: Secondary | ICD-10-CM

## 2022-12-27 MED ORDER — SAFETY SEAL MISCELLANEOUS MISC: 1.0000 | Freq: Every morning | 6 refills | Status: DC

## 2022-12-27 NOTE — Progress Notes (Deleted)
   New Patient Visit   Subjective  [name] is a 42 y.o. female who presents for the following: Hair loss  Patient states {he/she/they:23295} has *** located at the {LOCATION ON BODY:21951} that {he/she/they:23295} would like to have examined. Patient reports the areas have been there for {NUMBER 1-10:22536} {Time; units week/month/year w plurals:19499}. {He/she (caps):30048} reports the areas {Actions; are/are not:16769} bothersome.Patient rates irritation {NUMBER 1-10:22536} out of 10. {He/she (caps):30048} states that the areas {ACTIONS; HAVE/HAVE NOT:19434} spread. Patient reports {he/she/they:23295} {HAS HAS NFA:21308} previously been treated for these areas. Patient *** Hx of bx. Patient *** family history of skin cancer(s).  The following portions of the chart were reviewed this encounter and updated as appropriate: medications, allergies, medical history  Review of Systems:  No other skin or systemic complaints except as noted in HPI or Assessment and Plan.  Objective  Well appearing patient in no apparent distress; mood and affect are within normal limits.  A focused examination was performed of the following areas: Scalp  Relevant exam findings are noted in the Assessment and Plan.    Assessment & Plan       No follow-ups on file.  ***  Documentation: I have reviewed the above documentation for accuracy and completeness, and I agree with the above.  Stasia Cavalier, am acting as scribe for Langston Reusing, DO.  Langston Reusing, DO

## 2022-12-27 NOTE — Progress Notes (Signed)
   New Patient Visit   Subjective  [name] is a 42 y.o. female who presents for the following: Hair loss  Patient states she has hair loss located at the scalp that she would like to have examined. Patient reports the areas have been there for 7 years.She states she noticed exceesive hair shedding after she had a serious Gastric antral vascular ectasia (GAVE) syndrome with secondary Anemia. She reports the areas are not bothersome.Patient rates irritation 0 out of 10. She states that the areas have spread. Patient reports she has not previously been treated for these areas. Currently she is using Prose shampoo and conditioner. She washes 2 times weekly. Patient states in the past she has Biotin.  The following portions of the chart were reviewed this encounter and updated as appropriate: medications, allergies, medical history  Review of Systems:  No other skin or systemic complaints except as noted in HPI or Assessment and Plan.  Objective  Well appearing patient in no apparent distress; mood and affect are within normal limits.  A focused examination was performed of the following areas: Scalp  Relevant exam findings are noted in the Assessment and Plan.             Assessment & Plan     ANDROGENETIC ALOPECIA (FEMALE PATTERN HAIR LOSS) Exam: Diffuse thinning of the crown and widening of the midline part with retention of the frontal hairline  Flared  Female Androgenic Alopecia is a chronic condition related to genetics and/or hormonal changes.  In women androgenetic alopecia is commonly associated with menopause but may occur any time after puberty.  It causes hair thinning primarily on the crown with widening of the part and temporal hairline recession.  Can use OTC Rogaine (minoxidil) 5% solution/foam as directed.  Oral treatments in female patients who have no contraindication may include :   Treatment Plan: - We will plan to prescribe Topical Compound  (Contains Minoxidil and Finasteride) to [facility] to use in the morning to prevent unwanted hair growth  - We will plan to follow up in 4 Months -Start viviscal supplements daily   Long term medication management.  Patient is using long term (months to years) prescription medication  to control their dermatologic condition.  These medications require periodic monitoring to evaluate for efficacy and side effects and may require periodic laboratory monitoring.     Iron Deficiency Anemia (secondary to GAVE) - Assessment: Patient has Iron Deficiency Anemia secondary to GAVE. - Plan: Continue coordination of care with the hematologist for ongoing monitoring and management. No additional dermatological intervention indicated at this time. -Recent labs reviewed and current iron and ferritin levels are normal.  Explained to pt that her recent historically   No follow-ups on file.    Documentation: I have reviewed the above documentation for accuracy and completeness, and I agree with the above.  Stasia Cavalier, am acting as scribe for Langston Reusing, DO.  Langston Reusing, DO

## 2022-12-27 NOTE — Patient Instructions (Addendum)
Hello [name],  Thank you for visiting [place] today. We appreciate your commitment to addressing your health concerns, specifically regarding your hair thinning. Here is a summary of the key instructions from today's consultation:  Diagnosis: Androgenetic Alopecia  - Medications for Hair Thinning:   - Minoxidil and Finasteride Compound: Apply a few drops to the scalp each morning. This compound is prepared by a compounding pharmacy and costs approximately $45 per bottle, which lasts about 1.5 to 2 months. The pharmacy will contact you for payment and will mail the medication to you.   - Viviscal Supplement: Take twice daily with food to minimize gastrointestinal upset. Available at [facility] and online.  - Follow-Up: Schedule a follow-up appointment in 4 months to evaluate progress and take new pictures.  - Additional Information:   - It takes about 3 to 4 months to see results from the topical medication.   - Continue using the medication as part of your daily routine to maintain results.  It was a pleasure meeting you, and I look forward to seeing your progress at our next appointment. Please feel free to reach out if you have any questions or concerns in the meantime.  Warm regards,  Dr. Langston Reusing Dermatology       Important Information   Due to recent changes in healthcare laws, you may see results of your pathology and/or laboratory studies on MyChart before the doctors have had a chance to review them. We understand that in some cases there may be results that are confusing or concerning to you. Please understand that not all results are received at the same time and often the doctors may need to interpret multiple results in order to provide you with the best plan of care or course of treatment. Therefore, we ask that you please give [place] 2 business days to thoroughly review all your results before contacting the office for clarification. Should we see a critical lab result, you  will be contacted sooner.     If You Need Anything After Your Visit   If you have any questions or concerns for your doctor, please call our main line at [phone]. If no one answers, please leave a voicemail as directed and we will return your call as soon as possible. Messages left after 4 pm will be answered the following business day.    You may also send [place] a message via MyChart. We typically respond to MyChart messages within 1-2 business days.  For prescription refills, please ask your pharmacy to contact our office. Our fax number is [phone].  If you have an urgent issue when the clinic is closed that cannot wait until the next business day, you can page your doctor at the number below.     Please note that while we do our best to be available for urgent issues outside of office hours, we are not available 24/7.    If you have an urgent issue and are unable to reach [place], you may choose to seek medical care at your doctor's office, retail clinic, urgent care center, or emergency room.   If you have a medical emergency, please immediately call 911 or go to the emergency department. In the event of inclement weather, please call our main line at [phone] for an update on the status of any delays or closures.  Dermatology Medication Tips: Please keep the boxes that topical medications come in in order to help keep track of the instructions about where and how to  use these. Pharmacies typically print the medication instructions only on the boxes and not directly on the medication tubes.   If your medication is too expensive, please contact our office at [phone] or send [place] a message through MyChart.    We are unable to tell what your co-pay for medications will be in advance as this is different depending on your insurance coverage. However, we may be able to find a substitute medication at lower cost or fill out paperwork to get insurance to cover a needed medication.     If a prior authorization is required to get your medication covered by your insurance company, please allow [place] 1-2 business days to complete this process.   Drug prices often vary depending on where the prescription is filled and some pharmacies may offer cheaper prices.   The website www.goodrx.com contains coupons for medications through different pharmacies. The prices here do not account for what the cost may be with help from insurance (it may be cheaper with your insurance), but the website can give you the price if you did not use any insurance.  - You can print the associated coupon and take it with your prescription to the pharmacy.  - You may also stop by our office during regular business hours and pick up a GoodRx coupon card.  - If you need your prescription sent electronically to a different pharmacy, notify our office through [facility] or by phone at [phone]

## 2022-12-28 DIAGNOSIS — L821 Other seborrheic keratosis: Secondary | ICD-10-CM | POA: Diagnosis not present

## 2022-12-28 DIAGNOSIS — L820 Inflamed seborrheic keratosis: Secondary | ICD-10-CM | POA: Diagnosis not present

## 2022-12-28 DIAGNOSIS — D225 Melanocytic nevi of trunk: Secondary | ICD-10-CM | POA: Diagnosis not present

## 2022-12-28 DIAGNOSIS — D485 Neoplasm of uncertain behavior of skin: Secondary | ICD-10-CM | POA: Diagnosis not present

## 2022-12-28 DIAGNOSIS — D1801 Hemangioma of skin and subcutaneous tissue: Secondary | ICD-10-CM | POA: Diagnosis not present

## 2022-12-30 ENCOUNTER — Other Ambulatory Visit: Payer: Self-pay | Admitting: Gastroenterology

## 2022-12-30 DIAGNOSIS — K2970 Gastritis, unspecified, without bleeding: Secondary | ICD-10-CM

## 2023-01-03 DIAGNOSIS — F4312 Post-traumatic stress disorder, chronic: Secondary | ICD-10-CM | POA: Diagnosis not present

## 2023-01-03 DIAGNOSIS — F3181 Bipolar II disorder: Secondary | ICD-10-CM | POA: Diagnosis not present

## 2023-01-03 DIAGNOSIS — F410 Panic disorder [episodic paroxysmal anxiety] without agoraphobia: Secondary | ICD-10-CM | POA: Diagnosis not present

## 2023-01-03 DIAGNOSIS — F5101 Primary insomnia: Secondary | ICD-10-CM | POA: Diagnosis not present

## 2023-01-07 DIAGNOSIS — F410 Panic disorder [episodic paroxysmal anxiety] without agoraphobia: Secondary | ICD-10-CM | POA: Diagnosis not present

## 2023-01-07 DIAGNOSIS — F5101 Primary insomnia: Secondary | ICD-10-CM | POA: Diagnosis not present

## 2023-01-07 DIAGNOSIS — F4312 Post-traumatic stress disorder, chronic: Secondary | ICD-10-CM | POA: Diagnosis not present

## 2023-01-07 DIAGNOSIS — F3181 Bipolar II disorder: Secondary | ICD-10-CM | POA: Diagnosis not present

## 2023-01-08 DIAGNOSIS — L988 Other specified disorders of the skin and subcutaneous tissue: Secondary | ICD-10-CM | POA: Diagnosis not present

## 2023-01-08 DIAGNOSIS — D485 Neoplasm of uncertain behavior of skin: Secondary | ICD-10-CM | POA: Diagnosis not present

## 2023-01-09 DIAGNOSIS — R339 Retention of urine, unspecified: Secondary | ICD-10-CM | POA: Diagnosis not present

## 2023-01-14 DIAGNOSIS — F4312 Post-traumatic stress disorder, chronic: Secondary | ICD-10-CM | POA: Diagnosis not present

## 2023-01-14 DIAGNOSIS — F3181 Bipolar II disorder: Secondary | ICD-10-CM | POA: Diagnosis not present

## 2023-01-14 DIAGNOSIS — F410 Panic disorder [episodic paroxysmal anxiety] without agoraphobia: Secondary | ICD-10-CM | POA: Diagnosis not present

## 2023-01-14 DIAGNOSIS — F5101 Primary insomnia: Secondary | ICD-10-CM | POA: Diagnosis not present

## 2023-01-21 DIAGNOSIS — F410 Panic disorder [episodic paroxysmal anxiety] without agoraphobia: Secondary | ICD-10-CM | POA: Diagnosis not present

## 2023-01-21 DIAGNOSIS — F5101 Primary insomnia: Secondary | ICD-10-CM | POA: Diagnosis not present

## 2023-01-21 DIAGNOSIS — F4312 Post-traumatic stress disorder, chronic: Secondary | ICD-10-CM | POA: Diagnosis not present

## 2023-01-21 DIAGNOSIS — F3181 Bipolar II disorder: Secondary | ICD-10-CM | POA: Diagnosis not present

## 2023-01-28 DIAGNOSIS — F4312 Post-traumatic stress disorder, chronic: Secondary | ICD-10-CM | POA: Diagnosis not present

## 2023-01-28 DIAGNOSIS — F410 Panic disorder [episodic paroxysmal anxiety] without agoraphobia: Secondary | ICD-10-CM | POA: Diagnosis not present

## 2023-01-28 DIAGNOSIS — F3181 Bipolar II disorder: Secondary | ICD-10-CM | POA: Diagnosis not present

## 2023-01-28 DIAGNOSIS — F5101 Primary insomnia: Secondary | ICD-10-CM | POA: Diagnosis not present

## 2023-02-04 DIAGNOSIS — F4312 Post-traumatic stress disorder, chronic: Secondary | ICD-10-CM | POA: Diagnosis not present

## 2023-02-04 DIAGNOSIS — F410 Panic disorder [episodic paroxysmal anxiety] without agoraphobia: Secondary | ICD-10-CM | POA: Diagnosis not present

## 2023-02-04 DIAGNOSIS — F5101 Primary insomnia: Secondary | ICD-10-CM | POA: Diagnosis not present

## 2023-02-04 DIAGNOSIS — F3181 Bipolar II disorder: Secondary | ICD-10-CM | POA: Diagnosis not present

## 2023-02-07 DIAGNOSIS — F3174 Bipolar disorder, in full remission, most recent episode manic: Secondary | ICD-10-CM | POA: Diagnosis not present

## 2023-02-07 DIAGNOSIS — G2401 Drug induced subacute dyskinesia: Secondary | ICD-10-CM | POA: Diagnosis not present

## 2023-02-07 DIAGNOSIS — G2111 Neuroleptic induced parkinsonism: Secondary | ICD-10-CM | POA: Diagnosis not present

## 2023-02-07 DIAGNOSIS — F3175 Bipolar disorder, in partial remission, most recent episode depressed: Secondary | ICD-10-CM | POA: Diagnosis not present

## 2023-02-08 DIAGNOSIS — R339 Retention of urine, unspecified: Secondary | ICD-10-CM | POA: Diagnosis not present

## 2023-02-11 DIAGNOSIS — E1169 Type 2 diabetes mellitus with other specified complication: Secondary | ICD-10-CM | POA: Diagnosis not present

## 2023-02-11 DIAGNOSIS — I10 Essential (primary) hypertension: Secondary | ICD-10-CM | POA: Diagnosis not present

## 2023-02-13 DIAGNOSIS — F4312 Post-traumatic stress disorder, chronic: Secondary | ICD-10-CM | POA: Diagnosis not present

## 2023-02-13 DIAGNOSIS — F410 Panic disorder [episodic paroxysmal anxiety] without agoraphobia: Secondary | ICD-10-CM | POA: Diagnosis not present

## 2023-02-13 DIAGNOSIS — F3181 Bipolar II disorder: Secondary | ICD-10-CM | POA: Diagnosis not present

## 2023-02-13 DIAGNOSIS — F5101 Primary insomnia: Secondary | ICD-10-CM | POA: Diagnosis not present

## 2023-02-13 DIAGNOSIS — R399 Unspecified symptoms and signs involving the genitourinary system: Secondary | ICD-10-CM | POA: Diagnosis not present

## 2023-02-18 DIAGNOSIS — F4312 Post-traumatic stress disorder, chronic: Secondary | ICD-10-CM | POA: Diagnosis not present

## 2023-02-18 DIAGNOSIS — F5101 Primary insomnia: Secondary | ICD-10-CM | POA: Diagnosis not present

## 2023-02-18 DIAGNOSIS — F410 Panic disorder [episodic paroxysmal anxiety] without agoraphobia: Secondary | ICD-10-CM | POA: Diagnosis not present

## 2023-02-18 DIAGNOSIS — F3181 Bipolar II disorder: Secondary | ICD-10-CM | POA: Diagnosis not present

## 2023-02-20 ENCOUNTER — Other Ambulatory Visit: Payer: Self-pay

## 2023-02-20 ENCOUNTER — Encounter (HOSPITAL_COMMUNITY): Payer: Self-pay

## 2023-02-20 ENCOUNTER — Emergency Department (HOSPITAL_COMMUNITY)
Admission: EM | Admit: 2023-02-20 | Discharge: 2023-02-20 | Discharge status: Against Medical Advice | Payer: BC Managed Care – PPO | Attending: Emergency Medicine | Admitting: Emergency Medicine

## 2023-02-20 DIAGNOSIS — R109 Unspecified abdominal pain: Secondary | ICD-10-CM | POA: Diagnosis not present

## 2023-02-20 DIAGNOSIS — R509 Fever, unspecified: Secondary | ICD-10-CM | POA: Diagnosis not present

## 2023-02-20 DIAGNOSIS — Z5321 Procedure and treatment not carried out due to patient leaving prior to being seen by health care provider: Secondary | ICD-10-CM | POA: Insufficient documentation

## 2023-02-20 DIAGNOSIS — N390 Urinary tract infection, site not specified: Secondary | ICD-10-CM | POA: Diagnosis not present

## 2023-02-20 DIAGNOSIS — R1084 Generalized abdominal pain: Secondary | ICD-10-CM | POA: Insufficient documentation

## 2023-02-20 LAB — BASIC METABOLIC PANEL
Anion gap: 15 (ref 5–15)
BUN: 17 mg/dL (ref 6–20)
CO2: 19 mmol/L — ABNORMAL LOW (ref 22–32)
Calcium: 10.3 mg/dL (ref 8.9–10.3)
Chloride: 103 mmol/L (ref 98–111)
Creatinine, Ser: 0.82 mg/dL (ref 0.44–1.00)
GFR, Estimated: >60 mL/min (ref >60)
Glucose, Bld: 165 mg/dL — ABNORMAL HIGH (ref 70–99)
Potassium: 4.3 mmol/L (ref 3.5–5.1)
Sodium: 137 mmol/L (ref 135–145)

## 2023-02-20 LAB — URINALYSIS, ROUTINE W REFLEX MICROSCOPIC
Bilirubin Urine: NEGATIVE
Glucose, UA: NEGATIVE mg/dL
Hgb urine dipstick: NEGATIVE
Ketones, ur: NEGATIVE mg/dL
Nitrite: POSITIVE — AB
Protein, ur: 30 mg/dL — AB
Specific Gravity, Urine: 1.015 (ref 1.005–1.030)
WBC, UA: >50 WBC/hpf (ref 0–5)
pH: 6 (ref 5.0–8.0)

## 2023-02-20 LAB — CBC WITH DIFFERENTIAL/PLATELET
Abs Immature Granulocytes: 0.08 10*3/uL — ABNORMAL HIGH (ref 0.00–0.07)
Basophils Absolute: 0.1 10*3/uL (ref 0.0–0.1)
Basophils Relative: 1 %
Eosinophils Absolute: 0.3 10*3/uL (ref 0.0–0.5)
Eosinophils Relative: 3 %
HCT: 46.7 % — ABNORMAL HIGH (ref 36.0–46.0)
Hemoglobin: 14.8 g/dL (ref 12.0–15.0)
Immature Granulocytes: 1 %
Lymphocytes Relative: 23 %
Lymphs Abs: 2 10*3/uL (ref 0.7–4.0)
MCH: 29.2 pg (ref 26.0–34.0)
MCHC: 31.7 g/dL (ref 30.0–36.0)
MCV: 92.1 fL (ref 80.0–100.0)
Monocytes Absolute: 0.7 10*3/uL (ref 0.1–1.0)
Monocytes Relative: 7 %
Neutro Abs: 5.7 10*3/uL (ref 1.7–7.7)
Neutrophils Relative %: 65 %
Platelets: 455 10*3/uL — ABNORMAL HIGH (ref 150–400)
RBC: 5.07 MIL/uL (ref 3.87–5.11)
RDW: 14.5 % (ref 11.5–15.5)
WBC: 8.8 10*3/uL (ref 4.0–10.5)
nRBC: 0 % (ref 0.0–0.2)

## 2023-02-20 NOTE — ED Provider Triage Note (Signed)
Emergency Medicine Provider Triage Evaluation Note  [name] , a 42 y.o. female  was evaluated in triage.  Pt complains of infection in her urine.  Seen at [facility] where her care is primarily at.  She had a urine culture that showed resistant E. coli.  Sent here for IV gentamicin.  She has been having urinary symptoms for 2 weeks and intermittent fever.  She also endorses associated abdominal pain and flank pain.  History of horseshoe kidney.  Review of Systems  Positive:  Negative: See above   Physical Exam  BP (!) 151/91 (BP Location: Right Arm)   Pulse 96   Temp 98 F (36.7 C)   Resp 16   Ht 5\' 6"  (1.676 m)   Wt 102.1 kg   SpO2 98%   BMI 36.32 kg/m  Gen:   Awake, no distress   Resp:  Normal effort  MSK:   Moves extremities without difficulty  Other:  Diffuse abdominal tenderness.   Medical Decision Making  Medically screening exam initiated at 1:15 PM.  Appropriate orders placed.  [name] was informed that the remainder of the evaluation will be completed by another provider, this initial triage assessment does not replace that evaluation, and the importance of remaining in the ED until their evaluation is complete.     Honor Loh [place], [place] 02/20/23 1317

## 2023-02-20 NOTE — ED Triage Notes (Signed)
Pt came to ED for possible sepsis. Pt had gallbladder taken out last week and culture came back that pt has infection. PCP  states she needs IV abx.

## 2023-03-07 DIAGNOSIS — F3181 Bipolar II disorder: Secondary | ICD-10-CM | POA: Diagnosis not present

## 2023-03-07 DIAGNOSIS — F4312 Post-traumatic stress disorder, chronic: Secondary | ICD-10-CM | POA: Diagnosis not present

## 2023-03-07 DIAGNOSIS — F410 Panic disorder [episodic paroxysmal anxiety] without agoraphobia: Secondary | ICD-10-CM | POA: Diagnosis not present

## 2023-03-07 DIAGNOSIS — F5101 Primary insomnia: Secondary | ICD-10-CM | POA: Diagnosis not present

## 2023-03-11 ENCOUNTER — Encounter: Payer: Self-pay | Admitting: Gastroenterology

## 2023-03-12 ENCOUNTER — Inpatient Hospital Stay: Payer: BC Managed Care – PPO | Attending: Physician Assistant

## 2023-03-12 DIAGNOSIS — D509 Iron deficiency anemia, unspecified: Secondary | ICD-10-CM | POA: Diagnosis not present

## 2023-03-12 LAB — CBC WITH DIFFERENTIAL (CANCER CENTER ONLY)
Abs Immature Granulocytes: 0.07 10*3/uL (ref 0.00–0.07)
Basophils Absolute: 0.1 10*3/uL (ref 0.0–0.1)
Basophils Relative: 1 %
Eosinophils Absolute: 0.4 10*3/uL (ref 0.0–0.5)
Eosinophils Relative: 5 %
HCT: 46.1 % — ABNORMAL HIGH (ref 36.0–46.0)
Hemoglobin: 14.8 g/dL (ref 12.0–15.0)
Immature Granulocytes: 1 %
Lymphocytes Relative: 24 %
Lymphs Abs: 2.2 10*3/uL (ref 0.7–4.0)
MCH: 29.4 pg (ref 26.0–34.0)
MCHC: 32.1 g/dL (ref 30.0–36.0)
MCV: 91.7 fL (ref 80.0–100.0)
Monocytes Absolute: 0.6 10*3/uL (ref 0.1–1.0)
Monocytes Relative: 7 %
Neutro Abs: 5.8 10*3/uL (ref 1.7–7.7)
Neutrophils Relative %: 62 %
Platelet Count: 423 10*3/uL — ABNORMAL HIGH (ref 150–400)
RBC: 5.03 MIL/uL (ref 3.87–5.11)
RDW: 13.7 % (ref 11.5–15.5)
WBC Count: 9.2 10*3/uL (ref 4.0–10.5)
nRBC: 0 % (ref 0.0–0.2)

## 2023-03-12 LAB — IRON AND IRON BINDING CAPACITY (CC-WL,HP ONLY)
Iron: 99 ug/dL (ref 28–170)
Saturation Ratios: 17 % (ref 10.4–31.8)
TIBC: 580 ug/dL — ABNORMAL HIGH (ref 250–450)
UIBC: 481 ug/dL — ABNORMAL HIGH (ref 148–442)

## 2023-03-12 LAB — FERRITIN: Ferritin: 115 ng/mL (ref 11–307)

## 2023-03-21 DIAGNOSIS — F410 Panic disorder [episodic paroxysmal anxiety] without agoraphobia: Secondary | ICD-10-CM | POA: Diagnosis not present

## 2023-03-21 DIAGNOSIS — F3181 Bipolar II disorder: Secondary | ICD-10-CM | POA: Diagnosis not present

## 2023-03-21 DIAGNOSIS — F5101 Primary insomnia: Secondary | ICD-10-CM | POA: Diagnosis not present

## 2023-03-21 DIAGNOSIS — F4312 Post-traumatic stress disorder, chronic: Secondary | ICD-10-CM | POA: Diagnosis not present

## 2023-04-08 ENCOUNTER — Encounter: Payer: Self-pay | Admitting: Gastroenterology

## 2023-04-11 DIAGNOSIS — G2401 Drug induced subacute dyskinesia: Secondary | ICD-10-CM | POA: Diagnosis not present

## 2023-04-11 DIAGNOSIS — F3174 Bipolar disorder, in full remission, most recent episode manic: Secondary | ICD-10-CM | POA: Diagnosis not present

## 2023-04-11 DIAGNOSIS — G2111 Neuroleptic induced parkinsonism: Secondary | ICD-10-CM | POA: Diagnosis not present

## 2023-04-11 DIAGNOSIS — F3175 Bipolar disorder, in partial remission, most recent episode depressed: Secondary | ICD-10-CM | POA: Diagnosis not present

## 2023-04-15 ENCOUNTER — Other Ambulatory Visit: Payer: Self-pay | Admitting: Gastroenterology

## 2023-04-15 DIAGNOSIS — F3181 Bipolar II disorder: Secondary | ICD-10-CM | POA: Diagnosis not present

## 2023-04-15 DIAGNOSIS — F410 Panic disorder [episodic paroxysmal anxiety] without agoraphobia: Secondary | ICD-10-CM | POA: Diagnosis not present

## 2023-04-15 DIAGNOSIS — F5101 Primary insomnia: Secondary | ICD-10-CM | POA: Diagnosis not present

## 2023-04-15 DIAGNOSIS — F4312 Post-traumatic stress disorder, chronic: Secondary | ICD-10-CM | POA: Diagnosis not present

## 2023-04-17 DIAGNOSIS — E1169 Type 2 diabetes mellitus with other specified complication: Secondary | ICD-10-CM | POA: Diagnosis not present

## 2023-04-17 DIAGNOSIS — I10 Essential (primary) hypertension: Secondary | ICD-10-CM | POA: Diagnosis not present

## 2023-04-18 ENCOUNTER — Ambulatory Visit: Payer: BC Managed Care – PPO | Admitting: Gastroenterology

## 2023-04-18 ENCOUNTER — Encounter: Payer: Self-pay | Admitting: Gastroenterology

## 2023-04-18 ENCOUNTER — Telehealth: Payer: Self-pay | Admitting: Gastroenterology

## 2023-04-18 ENCOUNTER — Other Ambulatory Visit (INDEPENDENT_AMBULATORY_CARE_PROVIDER_SITE_OTHER): Payer: BC Managed Care – PPO

## 2023-04-18 VITALS — BP 130/80 | HR 91 | Ht 66.0 in | Wt 229.0 lb

## 2023-04-18 DIAGNOSIS — D509 Iron deficiency anemia, unspecified: Secondary | ICD-10-CM

## 2023-04-18 DIAGNOSIS — K31819 Angiodysplasia of stomach and duodenum without bleeding: Secondary | ICD-10-CM

## 2023-04-18 DIAGNOSIS — R109 Unspecified abdominal pain: Secondary | ICD-10-CM

## 2023-04-18 DIAGNOSIS — K760 Fatty (change of) liver, not elsewhere classified: Secondary | ICD-10-CM

## 2023-04-18 DIAGNOSIS — K2970 Gastritis, unspecified, without bleeding: Secondary | ICD-10-CM

## 2023-04-18 DIAGNOSIS — R208 Other disturbances of skin sensation: Secondary | ICD-10-CM | POA: Diagnosis not present

## 2023-04-18 LAB — HEPATIC FUNCTION PANEL
ALT: 30 U/L (ref 0–35)
AST: 27 U/L (ref 0–37)
Albumin: 5.1 g/dL (ref 3.5–5.2)
Alkaline Phosphatase: 36 U/L — ABNORMAL LOW (ref 39–117)
Bilirubin, Direct: 0 mg/dL (ref 0.0–0.3)
Total Bilirubin: 0.4 mg/dL (ref 0.2–1.2)
Total Protein: 8 g/dL (ref 6.0–8.3)

## 2023-04-18 MED ORDER — OMEPRAZOLE 40 MG PO CPDR: 40.0000 mg | DELAYED_RELEASE_CAPSULE | Freq: Every day | ORAL | Status: DC

## 2023-04-18 NOTE — Patient Instructions (Signed)
Please go to the lab in the basement of our building to have lab work done as you leave today. Hit "B" for basement when you get on the elevator.  When the doors open the lab is on your left.  We will call you with the results. Thank you.  Decrease omeprazole to once daily.  You have been scheduled for a CT scan of the abdomen and pelvis at [facility], 1st floor Radiology. You are scheduled on Tuesday, 04-23-23 at 7:30am. You should arrive 7:15am for registration. Plan on being there 30 to 60 minutes, depending on the type of exam you are having performed.  If you have any questions regarding your exam or if you need to reschedule, you may call [facility] Radiology Scheduling at [phone] between the hours of 8:00 am and 5:00 pm, Monday-Friday.   Thank you for entrusting me with your care and for choosing [facility], Dr. Ileene Patrick    If your blood pressure at your visit was 140/90 or greater, please contact your primary care physician to follow up on this. ______________________________________________________  If you are age 72 or older, your body mass index should be between 23-30. Your Body mass index is 36.96 kg/m. If this is out of the aforementioned range listed, please consider follow up with your Primary Care Provider.  If you are age 57 or younger, your body mass index should be between 19-25. Your Body mass index is 36.96 kg/m. If this is out of the aformentioned range listed, please consider follow up with your Primary Care Provider.  ________________________________________________________  The  GI providers would like to encourage you to use [facility] to communicate with providers for non-urgent requests or questions.  Due to long hold times on the telephone, sending your provider a message by [facility] may be a faster and more efficient way to get a response.  Please allow 48 business hours for a response.  Please remember that this is for non-urgent  requests.  _______________________________________________________  Due to recent changes in healthcare laws, you may see the results of your imaging and laboratory studies on MyChart before your provider has had a chance to review them.  We understand that in some cases there may be results that are confusing or concerning to you. Not all laboratory results come back in the same time frame and the provider may be waiting for multiple results in order to interpret others.  Please give [place] 48 hours in order for your provider to thoroughly review all the results before contacting the office for clarification of your results.

## 2023-04-18 NOTE — Telephone Encounter (Signed)
Patient scheduled for 2-18 at [facility]

## 2023-04-18 NOTE — Progress Notes (Signed)
HPI :  43 year old female here for follow-up visit.  Recall of seeing her for iron deficiency anemia secondary to GAVE, she is also had issues with chronic diarrhea in the past.  Recall she has a complicated medical history to include bipolar disorder, chronic abdominal pain, pelvic pain, fibromyalgia, hx of IC s/p total cystectomy with [place] pouch. She is s/p hysterectomy, bowel resection when she had a complication from her [place] pouch.  She was on chronic narcotics for several years for chronic pain, has had bilateral celiac plexus blocks in June 2016 and had a neurostimulator in place, which has since been removed. She had eventually come off chronic narcotics in August 2021.    She has had an extensive workup as outlined in prior notes for her bowels.  Her workup has been negative.  She has been tried on a variety of regimens for her symptoms to include Flagyl, rifaximin, Colestid, cholestyramine, Imodium, Lomotil, dicyclomine, alosetron.   Recall she has undergone extensive evaluation for her iron deficiency in the past.  More recently she had a capsule endoscopy done last May which showed that her small bowel is normal but she had a lot of adherent heme in her stomach.  We did an EGD at the hospital in May of last year and she was found to have suspected GAVE.  APC was applied.  She has had a really nice result with that.  Her hemoglobin and iron studies have improved.  Hemoglobin went from 10-15 now.  Her iron studies look better.  She is doing well with this.  The main issue she describes today is abdominal pain.  She describes a "burning sensation" that is mostly in the periumbilical area and spreads laterally on each side.  She states it comes and goes, lasts for a few hours at a time and then goes away.  She does not think it is superficial on her skin etc.  She is also had roughly 4 days worth of pains that shoot into her pelvic area.  With her [place] pouch recall she does not have  normal urination.  She denies any rectal pain.  She denies any clear triggers to her symptoms.  Does not get better or worse with eating.  No change with her bowel movements.  She has been taking Bentyl a few times daily and that helps her typical cramps but not with this new her pain.  This has been going on for a few months now.  She is also taken some IBgard in the past.  She has been on Ozempic and has lost 15 pounds on the regimen.  Recall she has a history of fatty liver, LFTs have been normal.  She denies any routine alcohol use.  She is currently having 3-4 bowel movements per day.  Taking Carafate has not helped at all nor has her PPI   Recall historically as part of her chronic pain regimen she has been on Elavil, Cymbalta, Lyrica, gabapentin in the past none of which have helped.  She has fortunately been off all narcotics     Prior workup: EGD 01/15/2017 - LA grade A esophagitis, inflammatory nodule at GEJ, normal esophagus otherwise, normal stomach, normal small bowel.    Colonoscopy January 2019 showed the following:   - Patent end-to-end ileo-colonic anastomosis, characterized by healthy appearing mucosa. - The examined portion of the ileum was normal. - One 3 mm polyp in the transverse colon, removed with a cold biopsy forceps. Resected and retrieved. -  The examination was otherwise normal on direct and retroflexion views.   Breath test indeterminate TTG negative Fecal elastase Fecal calprotectin 43 C diff negative   EGD 01/20/21 -  - LA Grade A esophagitis was found 40 cm from the incisors. There was some nodular tissue at the GEJ in this area, suspect inflammatory changes. Biopsies were taken with a cold forceps for histology. - The exam of the esophagus was otherwise normal. - Patchy mild inflammation characterized by adherent blood and friability was found in the gastric antrum. - The exam of the stomach was otherwise normal. - Biopsies were taken with a cold forceps  in the gastric body, at the incisura and in the gastric antrum for Helicobacter pylori testing. - The duodenal bulb and second portion of the duodenum were normal. Biopsies for histology were taken with a cold forceps for evaluation of celiac disease.     Colonoscopy 01/20/21 - The perianal and digital rectal examinations were normal. - The terminal ileum appeared normal. - There was evidence of a prior end-to-side ileo-colonic anastomosis in the ascending colon. This was patent and was characterized by healthy appearing mucosa. - A few medium-mouthed diverticula were found in the right colon. - A 3 mm polyp was found in the transverse colon. The polyp was sessile. The polyp was removed with a cold biopsy forceps. Resection and retrieval were complete. I thought a photo of it was taken, but it was not. It was benign appearing. - Internal hemorrhoids were found during retroflexion. - The exam was otherwise without abnormality. - Biopsies for histology were taken with a cold forceps from the right colon, left colon and transverse colon for evaluation of microscopic colitis.   1. Surgical [P], duodenum - PEPTIC DUODENITIS - NO DYSPLASIA OR MALIGNANCY IDENTIFIED 2. Surgical [P], gastric antrum and gastric body - BENIGN GASTRIC MUCOSA WITH MILD REACTIVE CHANGES - NO H. PYLORI, INTESTINAL METAPLASIA OR MALIGNANCY IDENTIFIED 3. Surgical [P], GE junction, polyp (1) - HYPERPLASTIC GASTRIC POLYP(S) - NO H. PYLORI, INTESTINAL METAPLASIA OR MALIGNANCY IDENTIFIED 4. Surgical [P], random colon sites - BENIGN COLONIC MUCOSA - NO ACTIVE INFLAMMATION OR EVIDENCE OF MICROSCOPIC COLITIS - NO HIGH-GRADE DYSPLASIA OR MALIGNANCY IDENTIFIED 5. Surgical [P], colon, transverse, polyp (1) - TUBULAR ADENOMA (1 OF 1 FRAGMENTS) - NO HIGH-GRADE DYSPLASIA OR MALIGNANCY IDENTIFIED     MRE 04/17/21: IMPRESSION: 1. No evidence of acute or significant chronic bowel inflammation. 2. Stable postsurgical changes of  prior cystectomy with [place] pouch formation and right-sided periumbilical cystostomy formation. 3. Arterially enhancing 14 mm segment VI hepatic focus which equilibrates to background liver on all additional postcontrast sequences and does not demonstrate abnormal intrinsic T1 or T2 signal. No evidence of hepatic cirrhosis. Nonspecific imaging characteristics but almost certainly reflecting a benign lesion such as focal nodular hyperplasia or hepatic adenoma. Consider follow-up MRI with and without EOVIST contrast in 6 for more definitive characterization and assessment of stability. 4. Mild diffuse hepatic steatosis. 5. Horseshoe kidney.   MRI abdomen 09/08/21: IMPRESSION: 1. Hepatomegaly and hepatic steatosis. 2. No hepatic lesions identified. 3. Horseshoe kidney.     Capsule endoscopy 07/2022: Small intestine is normal without any concerning pathology there. However, she does have irritated mucosa in her stomach which is quite friable with adherent heme.  She had some gastritis noted on EGD a few years ago.  I suspect this is the likely cause of her iron deficiency.  Unclear if this represents a GAVE or something else but it may be amenable to  APC at the hospital.  EGD 07/25/22: - Esophagogastric landmarks identified. - 1 cm hiatal hernia. - Ectopic gastric mucosa in the upper third of the esophagus. - Normal esophagus otherwise - Suspected mild gastric antral vascular ectasia - quite friable, suspect the source of iron deficiency. Treated with argon plasma coagulation (APC). - Normal examined duodenum.   Past Medical History:  Diagnosis Date   ADHD (attention deficit hyperactivity disorder)    Allergy    Anemia    Anxiety    Arthritis, lumbar spine    Bipolar disorder (HCC)    Chronic back pain    Chronic headaches    Chronic interstitial cystitis    "removed my bladder when I went to end stage IC"   Depression    Disc degeneration, lumbar 01/10/2022   Fibromyalgia    GERD  (gastroesophageal reflux disease)    Gestational diabetes    resolved   History of blood transfusion 02/2010   "related to OR"   History of kidney stones    HLD (hyperlipidemia)    Hypothyroid    IBS (irritable colon syndrome)    IDA (iron deficiency anemia)    Interstitial cystitis 06/2008   Dr. Andrey Spearman [facility]   Polycystic ovarian disease    Preeclampsia 2009   Self-catheterizes urinary bladder    "I have an [place] pouch; made a bladder using part of my small intestines; cath out of stoma in my belly button" (08/02/2015)   Small bowel obstruction (HCC) 08/03/2015   Spina bifida (HCC) 01/10/2022   Thyroid disease    Vitamin D deficiency      Past Surgical History:  Procedure Laterality Date   ABDOMINAL HYSTERECTOMY  02/2010   "left my ovaries"   APPENDECTOMY  02/2010   BLADDER REMOVAL  02/2010   and [place] Pouch   CESAREAN SECTION  2009   CHOLECYSTECTOMY N/A 05/27/2017   Procedure: LAPAROSCOPIC CHOLECYSTECTOMY;  Surgeon: Axel Filler, MD;  Location: [facility] OR;  Service: General;  Laterality: N/A;   COLONOSCOPY     ESOPHAGOGASTRODUODENOSCOPY (EGD) WITH PROPOFOL N/A 07/25/2022   Procedure: ESOPHAGOGASTRODUODENOSCOPY (EGD) WITH PROPOFOL;  Surgeon: Benancio Deeds, MD;  Location: [facility] ENDOSCOPY;  Service: Gastroenterology;  Laterality: N/A;   HOT HEMOSTASIS N/A 07/25/2022   Procedure: HOT HEMOSTASIS (ARGON PLASMA COAGULATION/BICAP);  Surgeon: Benancio Deeds, MD;  Location: [facility] ENDOSCOPY;  Service: Gastroenterology;  Laterality: N/A;   [place] pouch  01/19/2021   Cystoscopy with bulk amid injection   LAPAROSCOPIC LYSIS OF ADHESIONS N/A 05/27/2017   Procedure: LAPAROSCOPIC LYSIS OF ADHESIONS;  Surgeon: Axel Filler, MD;  Location: [facility] OR;  Service: General;  Laterality: N/A;   OTHER SURGICAL HISTORY     remval of urethra when bladder was removed   SMALL INTESTINE SURGERY  2017   for bowel obstruction   SPINAL CORD STIMULATOR IMPLANT  09/2009; ~ 04/2015   SPINAL  CORD STIMULATOR REMOVAL  ~ 04/2015   UPPER GI ENDOSCOPY     WISDOM TOOTH EXTRACTION     Family History  Problem Relation Age of Onset   Lung cancer Mother        smoker   Other Father        benigh tumor between heart and lungs, had surgery and something went wrong, he died at 53   Diabetes Paternal Grandmother    Heart disease Paternal Grandmother    Diabetes Paternal Grandfather    Heart disease Paternal Grandfather    Pancreatic cancer Paternal Grandfather    Liver  cancer Paternal Grandfather    Breast cancer Maternal Aunt    Colon cancer Neg Hx    Esophageal cancer Neg Hx    Prostate cancer Neg Hx    Rectal cancer Neg Hx    Stomach cancer Neg Hx    Social History   Tobacco Use   Smoking status: Never   Smokeless tobacco: Never  Vaping Use   Vaping status: Never Used  Substance Use Topics   Alcohol use: Yes    Comment: 08/03/2015 "glass of wine maybe twice/month"   Drug use: No   Current Outpatient Medications  Medication Sig Dispense Refill   acetaminophen (TYLENOL) 650 MG CR tablet Take 1,300 mg by mouth 2 (two) times daily.     amantadine (SYMMETREL) 100 MG capsule Take 100 mg by mouth 2 (two) times daily.     amphetamine-dextroamphetamine (ADDERALL) 30 MG tablet Take 30 mg by mouth 2 (two) times daily.     AUSTEDO XR 36 MG TB24      clonazePAM (KLONOPIN) 2 MG tablet Take 1 mg by mouth 3 (three) times daily as needed for anxiety.     D3-50 1.25 MG (50000 UT) capsule Take 50,000 Units by mouth 3 (three) times a week.     dicyclomine (BENTYL) 10 MG capsule TAKE 1 CAPSULE BY MOUTH TWICE A DAY 180 capsule 1   diphenoxylate-atropine (LOMOTIL) 2.5-0.025 MG tablet Take 1 tablet by mouth 4 (four) times daily as needed for diarrhea or loose stools (IBS). 120 tablet 2   fenofibrate 160 MG tablet Take 160 mg by mouth at bedtime.     icosapent Ethyl (VASCEPA) 1 g capsule Take 2 g by mouth 2 (two) times daily.     lamoTRIgine (LAMICTAL) 200 MG tablet Take 400 mg by mouth at  bedtime.     levothyroxine (SYNTHROID) 125 MCG tablet Take 125 mcg by mouth daily before breakfast.     lithium carbonate (ESKALITH) 450 MG ER tablet Take 675 mg by mouth at bedtime.     metoprolol succinate (TOPROL-XL) 100 MG 24 hr tablet Take 100 mg by mouth at bedtime.     nitrofurantoin (MACRODANTIN) 50 MG capsule Take 50 mg by mouth at bedtime.     nystatin cream (MYCOSTATIN) Apply 1 Application topically daily as needed (vaginal itching).     omeprazole (PRILOSEC) 40 MG capsule TAKE 1 CAPSULE BY MOUTH TWICE A DAY 180 capsule 1   ondansetron (ZOFRAN) 4 MG tablet Take 2-4 mg by mouth every 8 (eight) hours as needed for vomiting or nausea.     oxybutynin (DITROPAN) 5 MG/5ML syrup 5 mg 2 (two) times daily as needed. Per [place] pouch     OZEMPIC, 0.25 OR 0.5 MG/DOSE, 2 MG/3ML SOPN      ramelteon (ROZEREM) 8 MG tablet Take 1 tablet by mouth at bedtime.     rOPINIRole (REQUIP) 2 MG tablet Take 2 mg by mouth at bedtime.     rosuvastatin (CRESTOR) 5 MG tablet Take 1 tablet by mouth at bedtime.     Safety Seal Miscellaneous MISC Apply 1 Application topically in the morning. Medication Name: Hormonic Hair Solution (Minoxidil 8%, Finasteride 0.05%) 30 g 6   sucralfate (CARAFATE) 1 g tablet TAKE 1 TABLET BY MOUTH EVERY 6 HOURS AS NEEDED. 60 tablet 0   telmisartan (MICARDIS) 40 MG tablet Take 40 mg by mouth at bedtime.     tiZANidine (ZANAFLEX) 4 MG tablet Take 8 mg by mouth 2 (two) times daily.  triamcinolone cream (KENALOG) 0.1 % Apply 1 Application topically daily as needed (itching).     carbamazepine (TEGRETOL XR) 200 MG 12 hr tablet Take 200 mg by mouth at bedtime. Taking 1/2 tab PO at HS (Patient not taking: Reported on 12/27/2022)     levonorgestrel-ethinyl estradiol (SEASONALE,INTROVALE,JOLESSA) 0.15-0.03 MG tablet Take 1 tablet by mouth in the morning.     lumateperone tosylate (CAPLYTA) 42 MG capsule Take 42 mg by mouth at bedtime. (Patient not taking: Reported on 12/27/2022)      valbenazine (INGREZZA) 80 MG capsule Take 80 mg by mouth at bedtime.     Current Facility-Administered Medications  Medication Dose Route Frequency Provider Last Rate Last Admin   acetaminophen (TYLENOL) tablet 650 mg  650 mg Oral Once Thayil, Irene T, PA-C       diphenhydrAMINE (BENADRYL) capsule 25 mg  25 mg Oral Once Thayil, Irene T, PA-C       Allergies  Allergen Reactions   Keflex [Cephalexin] Hives    Can take with antihistamine   Penicillins Hives and Other (See Comments)    PATIENT HAS HAD A PCN REACTION WITH IMMEDIATE RASH, FACIAL/TONGUE/THROAT SWELLING, SOB, OR LIGHTHEADEDNESS WITH HYPOTENSION:  #  #  #  YES  #  #  #   Has patient had a PCN reaction causing severe rash involving mucus membranes or skin necrosis: No Has patient had a PCN reaction that required hospitalization No Has patient had a PCN reaction occurring within the last 10 years: No If all of the above answers are "NO", then may proceed with Cephalosporin use.    Sulfa Antibiotics Hives     Review of Systems: All systems reviewed and negative except where noted in HPI.   Lab Results  Component Value Date   WBC 9.2 03/12/2023   HGB 14.8 03/12/2023   HCT 46.1 (H) 03/12/2023   MCV 91.7 03/12/2023   PLT 423 (H) 03/12/2023    Lab Results  Component Value Date   NA 137 02/20/2023   CL 103 02/20/2023   K 4.3 02/20/2023   CO2 19 (L) 02/20/2023   BUN 17 02/20/2023   CREATININE 0.82 02/20/2023   GFRNONAA >60 02/20/2023   CALCIUM 10.3 02/20/2023   PHOS 3.2 12/04/2022   ALBUMIN 5.1 04/18/2023   GLUCOSE 165 (H) 02/20/2023    Lab Results  Component Value Date   ALT 30 04/18/2023   AST 27 04/18/2023   ALKPHOS 36 (L) 04/18/2023   BILITOT 0.4 04/18/2023   Lab Results  Component Value Date   IRON 99 03/12/2023   TIBC 580 (H) 03/12/2023   FERRITIN 115 03/12/2023     Physical Exam: BP 130/80   Pulse 91   Ht 5\' 6"  (1.676 m)   Wt 229 lb (103.9 kg)   BMI 36.96 kg/m  Constitutional:  Pleasant,well-developed, female in no acute distress. Abdominal: Soft, nondistended, nontender.  There are no masses palpable.  Extremities: no edema Neurological: Alert and oriented to person place and time. Psychiatric: Normal mood and affect. Behavior is normal.   ASSESSMENT: 43 y.o. female here for assessment of the following  1. Burning sensation   2. Abdominal pain, unspecified abdominal location   3. GAVE (gastric antral vascular ectasia)   4. Iron deficiency anemia, unspecified iron deficiency anemia type   5. Metabolic dysfunction-associated steatotic liver disease (MASLD)    Complex history.  It is unclear to me what is driving her abdominal pain which is new for her over the past few  months and the way she describes it now.  It sounds neuropathic to me however she states it feels deeper and less superficial.  We discussed some options.  Unfortunately she has tried Elavil, Cymbalta, Lyrica, gabapentin in the past remotely for her prior pain none of which have helped.  On her current medication regimen now, I do not think Cymbalta or Elavil would go well with them.  I reassured her that her labs are looking okay but if this is really bothering her we can consider repeat cross-sectional imaging since it has been a few years since her last exam.  She wanted to pursue imaging for reassurance.  Ordered CT scan abdomen pelvis.  Otherwise, her anemia resolved, iron studies stable following APC of GAVE.  Will keep an eye on her blood counts moving forward periodically.  No overt blood loss currently. We discussed her omeprazole use, she has been on twice daily dosing for some time, not having much of any reflux, will reduce her omeprazole to 40 mg once daily and see how she does.  We discussed her history of MASLD, risks for fibrotic change. She does not drink alcohol.  She is on Ozempic and has lost some weight and doing well in that regard.  She is due for LFTs to trend.  Hopefully with  Ozempic and weight loss this will help reduce her risk for fibrotic change.  We discussed routine coffee intake and help in the situation, she does not like drinking coffee unfortunately.   PLAN: - CT scan abdomen / pelvis - suspect neuropathic pain but limited options for treatment for neuropathic pain. Consider retrial of lyrica or gabapentin pending her course and findings - monitor CBC and iron studies, she responded well to APC treatment - can reduce omeprazole to once daily at this point - counseled on fatty liver, repeat LFTs, continue Ozempic  Harlin Rain, MD [facility]

## 2023-04-18 NOTE — Telephone Encounter (Signed)
Patient insurance company rep called and stated that the patient has been approved for a CT scan of the abdomin with contrast at [facility] for the following dates of February 13/25 through April 13/25. Patient insurance rep provided me with the authorization number [id number]. Please advise.

## 2023-04-23 ENCOUNTER — Ambulatory Visit (HOSPITAL_COMMUNITY)
Admission: RE | Admit: 2023-04-23 | Discharge: 2023-04-23 | Disposition: A | Discharge status: Home / Self Care | Payer: BC Managed Care – PPO | Source: Ambulatory Visit | Attending: Gastroenterology | Admitting: Gastroenterology

## 2023-04-23 DIAGNOSIS — K2970 Gastritis, unspecified, without bleeding: Secondary | ICD-10-CM | POA: Diagnosis not present

## 2023-04-23 DIAGNOSIS — K7689 Other specified diseases of liver: Secondary | ICD-10-CM | POA: Diagnosis not present

## 2023-04-23 DIAGNOSIS — R109 Unspecified abdominal pain: Secondary | ICD-10-CM | POA: Diagnosis not present

## 2023-04-23 DIAGNOSIS — K31819 Angiodysplasia of stomach and duodenum without bleeding: Secondary | ICD-10-CM | POA: Diagnosis not present

## 2023-04-23 DIAGNOSIS — D509 Iron deficiency anemia, unspecified: Secondary | ICD-10-CM | POA: Diagnosis not present

## 2023-04-23 DIAGNOSIS — K760 Fatty (change of) liver, not elsewhere classified: Secondary | ICD-10-CM | POA: Diagnosis not present

## 2023-04-23 DIAGNOSIS — Q631 Lobulated, fused and horseshoe kidney: Secondary | ICD-10-CM | POA: Diagnosis not present

## 2023-04-23 DIAGNOSIS — N83201 Unspecified ovarian cyst, right side: Secondary | ICD-10-CM | POA: Diagnosis not present

## 2023-04-23 DIAGNOSIS — R208 Other disturbances of skin sensation: Secondary | ICD-10-CM | POA: Diagnosis not present

## 2023-04-23 LAB — POCT I-STAT CREATININE: Creatinine, Ser: 1 mg/dL (ref 0.44–1.00)

## 2023-04-23 MED ORDER — IOHEXOL 300 MG/ML  SOLN
100.0000 mL | Freq: Once | INTRAMUSCULAR | Status: AC | PRN
  Administered 2023-04-23: 100 mL via INTRAVENOUS

## 2023-04-29 ENCOUNTER — Encounter: Payer: Self-pay | Admitting: Dermatology

## 2023-04-29 ENCOUNTER — Ambulatory Visit (INDEPENDENT_AMBULATORY_CARE_PROVIDER_SITE_OTHER): Payer: BC Managed Care – PPO | Admitting: Dermatology

## 2023-04-29 VITALS — BP 122/77

## 2023-04-29 DIAGNOSIS — L649 Androgenic alopecia, unspecified: Secondary | ICD-10-CM

## 2023-04-29 DIAGNOSIS — N951 Menopausal and female climacteric states: Secondary | ICD-10-CM | POA: Diagnosis not present

## 2023-04-29 NOTE — Patient Instructions (Addendum)
 Your provider has sent your prescription to [facility] in [place], [place]. A pharmacy representative will call you to confirm details and take your payment information. If you do not receive a call within 24 hours, please contact the pharmacy at [phone] or [phone]. Your unique skincare compound is being formulated in our lab (most compounds take less than 24 hours). Your prescription is shipped vis USPS to your mailbox (2-4 business days). Priority shipping is available at an additional cost. Once received, you will electronically sign/acknowledge that you received your prescription. The pharmacy hours are Monday-Friday 9 am-6 pm EST and Saturday 9 am-1 pm EST.      Important Information  Due to recent changes in healthcare laws, you may see results of your pathology and/or laboratory studies on MyChart before the doctors have had a chance to review them. We understand that in some cases there may be results that are confusing or concerning to you. Please understand that not all results are received at the same time and often the doctors may need to interpret multiple results in order to provide you with the best plan of care or course of treatment. Therefore, we ask that you please give [place] 2 business days to thoroughly review all your results before contacting the office for clarification. Should we see a critical lab result, you will be contacted sooner.   If You Need Anything After Your Visit  If you have any questions or concerns for your doctor, please call our main line at [phone] If no one answers, please leave a voicemail as directed and we will return your call as soon as possible. Messages left after 4 pm will be answered the following business day.   You may also send [place] a message via MyChart. We typically respond to MyChart messages within 1-2 business days.  For prescription refills, please ask your pharmacy to contact our office. Our fax number is  [phone].  If you have an urgent issue when the clinic is closed that cannot wait until the next business day, you can page your doctor at the number below.    Please note that while we do our best to be available for urgent issues outside of office hours, we are not available 24/7.   If you have an urgent issue and are unable to reach [place], you may choose to seek medical care at your doctor's office, retail clinic, urgent care center, or emergency room.  If you have a medical emergency, please immediately call 911 or go to the emergency department. In the event of inclement weather, please call our main line at [phone] for an update on the status of any delays or closures.  Dermatology Medication Tips: Please keep the boxes that topical medications come in in order to help keep track of the instructions about where and how to use these. Pharmacies typically print the medication instructions only on the boxes and not directly on the medication tubes.   If your medication is too expensive, please contact our office at [phone] or send [place] a message through MyChart.   We are unable to tell what your co-pay for medications will be in advance as this is different depending on your insurance coverage. However, we may be able to find a substitute medication at lower cost or fill out paperwork to get insurance to cover a needed medication.   If a prior authorization is required to get your medication covered by your insurance company, please allow  [place] 1-2 business days to complete this process.  Drug prices often vary depending on where the prescription is filled and some pharmacies may offer cheaper prices.  The website www.goodrx.com contains coupons for medications through different pharmacies. The prices here do not account for what the cost may be with help from insurance (it may be cheaper with your insurance), but the website can give you the price if you did not use any insurance.  -  You can print the associated coupon and take it with your prescription to the pharmacy.  - You may also stop by our office during regular business hours and pick up a GoodRx coupon card.  - If you need your prescription sent electronically to a different pharmacy, notify our office through [facility] or by phone at [phone]

## 2023-04-29 NOTE — Progress Notes (Signed)
   Follow-Up Visit   Subjective  [name] is a 43 y.o. female who presents for the following:  Androgenetic Alopecia follow up - She says her hairdresser noticed tufts of hair coming in. She is using Minoxidil/Finasteride from [facility] daily and taking Viviscal daily.   The following portions of the chart were reviewed this encounter and updated as appropriate: medications, allergies, medical history  Review of Systems:  No other skin or systemic complaints except as noted in HPI or Assessment and Plan.  Objective  Well appearing patient in no apparent distress; mood and affect are within normal limits.   A focused examination was performed of the following areas:   Relevant exam findings are noted in the Assessment and Plan.             Assessment & Plan   ANDROGENETIC ALOPECIA (FEMALE PATTERN HAIR LOSS) Exam: Diffuse thinning of the crown and widening of the midline part with retention of the frontal hairline  Assessment: Patient has been on a regimen for female pattern hair loss for 4 months, showing significant improvement with new hair growth and flyaways. The hairdresser has noted improvement. The temples were not affected. The midline part of the patient's hair appears less widened, indicating progress.   Plan:   Continue with Viviscal supplements.   Continue applying topical medication (finasteride/minoxidil) to the scalp every morning, focusing on the top and sides.   Avoid oral medication due to the patient's history of high heart rate and possible hypertension.   Schedule a follow-up appointment in 6 months.   Refill prescription for topical medication.   Patient education provided on:     The expectation of continued improvement over time.     The importance of consistent use of topical medication.     Instructions to apply topical medication after washing hair when the skin is dry to avoid potential stinging/burning.     Return in about 6  months (around 10/27/2023) for Alopecia.  I, Joanie Coddington, CMA, am acting as scribe for [employer], DO .   Documentation: I have reviewed the above documentation for accuracy and completeness, and I agree with the above.  Langston Reusing, DO

## 2023-05-02 ENCOUNTER — Other Ambulatory Visit: Payer: Self-pay | Admitting: Gastroenterology

## 2023-05-02 ENCOUNTER — Encounter: Payer: Self-pay | Admitting: Gastroenterology

## 2023-05-02 DIAGNOSIS — N83202 Unspecified ovarian cyst, left side: Secondary | ICD-10-CM

## 2023-05-02 DIAGNOSIS — K769 Liver disease, unspecified: Secondary | ICD-10-CM

## 2023-05-03 ENCOUNTER — Encounter: Payer: Self-pay | Admitting: Physician Assistant

## 2023-05-03 ENCOUNTER — Other Ambulatory Visit: Payer: Self-pay

## 2023-05-03 DIAGNOSIS — D509 Iron deficiency anemia, unspecified: Secondary | ICD-10-CM

## 2023-05-06 ENCOUNTER — Inpatient Hospital Stay: Payer: BC Managed Care – PPO | Attending: Physician Assistant

## 2023-05-06 DIAGNOSIS — D509 Iron deficiency anemia, unspecified: Secondary | ICD-10-CM | POA: Insufficient documentation

## 2023-05-06 LAB — CBC WITH DIFFERENTIAL (CANCER CENTER ONLY)
Abs Immature Granulocytes: 0.08 10*3/uL — ABNORMAL HIGH (ref 0.00–0.07)
Basophils Absolute: 0.1 10*3/uL (ref 0.0–0.1)
Basophils Relative: 1 %
Eosinophils Absolute: 0.4 10*3/uL (ref 0.0–0.5)
Eosinophils Relative: 4 %
HCT: 43.5 % (ref 36.0–46.0)
Hemoglobin: 13.8 g/dL (ref 12.0–15.0)
Immature Granulocytes: 1 %
Lymphocytes Relative: 20 %
Lymphs Abs: 2 10*3/uL (ref 0.7–4.0)
MCH: 28.9 pg (ref 26.0–34.0)
MCHC: 31.7 g/dL (ref 30.0–36.0)
MCV: 91.2 fL (ref 80.0–100.0)
Monocytes Absolute: 0.7 10*3/uL (ref 0.1–1.0)
Monocytes Relative: 7 %
Neutro Abs: 6.9 10*3/uL (ref 1.7–7.7)
Neutrophils Relative %: 67 %
Platelet Count: 492 10*3/uL — ABNORMAL HIGH (ref 150–400)
RBC: 4.77 MIL/uL (ref 3.87–5.11)
RDW: 12.5 % (ref 11.5–15.5)
WBC Count: 10.2 10*3/uL (ref 4.0–10.5)
nRBC: 0 % (ref 0.0–0.2)

## 2023-05-06 LAB — FERRITIN: Ferritin: 87 ng/mL (ref 11–307)

## 2023-05-06 LAB — IRON AND IRON BINDING CAPACITY (CC-WL,HP ONLY)
Iron: 77 ug/dL (ref 28–170)
Saturation Ratios: 14 % (ref 10.4–31.8)
TIBC: 543 ug/dL — ABNORMAL HIGH (ref 250–450)
UIBC: 466 ug/dL — ABNORMAL HIGH (ref 148–442)

## 2023-05-07 ENCOUNTER — Other Ambulatory Visit: Payer: Self-pay

## 2023-05-07 DIAGNOSIS — N83201 Unspecified ovarian cyst, right side: Secondary | ICD-10-CM

## 2023-05-08 ENCOUNTER — Ambulatory Visit (HOSPITAL_COMMUNITY)
Admission: RE | Admit: 2023-05-08 | Discharge: 2023-05-08 | Disposition: A | Discharge status: Home / Self Care | Payer: BC Managed Care – PPO | Source: Ambulatory Visit | Attending: Gastroenterology | Admitting: Gastroenterology

## 2023-05-08 DIAGNOSIS — D134 Benign neoplasm of liver: Secondary | ICD-10-CM | POA: Diagnosis not present

## 2023-05-08 DIAGNOSIS — N83201 Unspecified ovarian cyst, right side: Secondary | ICD-10-CM

## 2023-05-08 DIAGNOSIS — Z90710 Acquired absence of both cervix and uterus: Secondary | ICD-10-CM | POA: Diagnosis not present

## 2023-05-08 DIAGNOSIS — K769 Liver disease, unspecified: Secondary | ICD-10-CM | POA: Diagnosis not present

## 2023-05-08 DIAGNOSIS — K760 Fatty (change of) liver, not elsewhere classified: Secondary | ICD-10-CM | POA: Diagnosis not present

## 2023-05-08 DIAGNOSIS — R160 Hepatomegaly, not elsewhere classified: Secondary | ICD-10-CM | POA: Diagnosis not present

## 2023-05-08 MED ORDER — GADOBUTROL 1 MMOL/ML IV SOLN
10.0000 mL | Freq: Once | INTRAVENOUS | Status: AC | PRN
  Administered 2023-05-08: 10 mL via INTRAVENOUS

## 2023-05-09 ENCOUNTER — Encounter: Payer: Self-pay | Admitting: Gastroenterology

## 2023-05-10 ENCOUNTER — Encounter (INDEPENDENT_AMBULATORY_CARE_PROVIDER_SITE_OTHER): Payer: Self-pay

## 2023-05-13 ENCOUNTER — Encounter: Payer: Self-pay | Admitting: Gastroenterology

## 2023-05-14 DIAGNOSIS — F5101 Primary insomnia: Secondary | ICD-10-CM | POA: Diagnosis not present

## 2023-05-14 DIAGNOSIS — F410 Panic disorder [episodic paroxysmal anxiety] without agoraphobia: Secondary | ICD-10-CM | POA: Diagnosis not present

## 2023-05-14 DIAGNOSIS — F4312 Post-traumatic stress disorder, chronic: Secondary | ICD-10-CM | POA: Diagnosis not present

## 2023-05-14 DIAGNOSIS — F3181 Bipolar II disorder: Secondary | ICD-10-CM | POA: Diagnosis not present

## 2023-05-15 ENCOUNTER — Other Ambulatory Visit: Payer: Self-pay | Admitting: *Deleted

## 2023-05-15 ENCOUNTER — Encounter: Payer: Self-pay | Admitting: Gastroenterology

## 2023-05-15 DIAGNOSIS — K769 Liver disease, unspecified: Secondary | ICD-10-CM

## 2023-05-15 NOTE — Progress Notes (Signed)
 af

## 2023-05-16 ENCOUNTER — Other Ambulatory Visit

## 2023-05-16 ENCOUNTER — Other Ambulatory Visit (HOSPITAL_COMMUNITY): Payer: Self-pay | Admitting: Obstetrics & Gynecology

## 2023-05-16 DIAGNOSIS — N83201 Unspecified ovarian cyst, right side: Secondary | ICD-10-CM

## 2023-05-16 DIAGNOSIS — K769 Liver disease, unspecified: Secondary | ICD-10-CM

## 2023-05-20 ENCOUNTER — Encounter: Payer: Self-pay | Admitting: Gastroenterology

## 2023-05-20 LAB — AFP TUMOR MARKER: AFP-Tumor Marker: 2.4 ng/mL

## 2023-05-23 ENCOUNTER — Encounter (HOSPITAL_COMMUNITY): Payer: Self-pay

## 2023-05-23 ENCOUNTER — Ambulatory Visit (HOSPITAL_COMMUNITY): Admission: RE | Admit: 2023-05-23 | Source: Ambulatory Visit

## 2023-05-27 DIAGNOSIS — F5101 Primary insomnia: Secondary | ICD-10-CM | POA: Diagnosis not present

## 2023-05-27 DIAGNOSIS — F4312 Post-traumatic stress disorder, chronic: Secondary | ICD-10-CM | POA: Diagnosis not present

## 2023-05-27 DIAGNOSIS — F410 Panic disorder [episodic paroxysmal anxiety] without agoraphobia: Secondary | ICD-10-CM | POA: Diagnosis not present

## 2023-05-27 DIAGNOSIS — R339 Retention of urine, unspecified: Secondary | ICD-10-CM | POA: Diagnosis not present

## 2023-05-27 DIAGNOSIS — F3181 Bipolar II disorder: Secondary | ICD-10-CM | POA: Diagnosis not present

## 2023-06-06 DIAGNOSIS — N83201 Unspecified ovarian cyst, right side: Secondary | ICD-10-CM | POA: Diagnosis not present

## 2023-06-10 ENCOUNTER — Other Ambulatory Visit: Payer: Self-pay | Admitting: Physician Assistant

## 2023-06-10 DIAGNOSIS — F410 Panic disorder [episodic paroxysmal anxiety] without agoraphobia: Secondary | ICD-10-CM | POA: Diagnosis not present

## 2023-06-10 DIAGNOSIS — F4312 Post-traumatic stress disorder, chronic: Secondary | ICD-10-CM | POA: Diagnosis not present

## 2023-06-10 DIAGNOSIS — F3181 Bipolar II disorder: Secondary | ICD-10-CM | POA: Diagnosis not present

## 2023-06-10 DIAGNOSIS — D509 Iron deficiency anemia, unspecified: Secondary | ICD-10-CM

## 2023-06-10 DIAGNOSIS — F5101 Primary insomnia: Secondary | ICD-10-CM | POA: Diagnosis not present

## 2023-06-11 ENCOUNTER — Encounter: Payer: Self-pay | Admitting: Physician Assistant

## 2023-06-11 ENCOUNTER — Inpatient Hospital Stay (HOSPITAL_BASED_OUTPATIENT_CLINIC_OR_DEPARTMENT_OTHER): Payer: BC Managed Care – PPO | Admitting: Physician Assistant

## 2023-06-11 ENCOUNTER — Inpatient Hospital Stay: Payer: BC Managed Care – PPO | Attending: Physician Assistant

## 2023-06-11 VITALS — BP 137/82 | HR 72 | Temp 96.8°F | Resp 13 | Wt 228.6 lb

## 2023-06-11 DIAGNOSIS — D509 Iron deficiency anemia, unspecified: Secondary | ICD-10-CM | POA: Insufficient documentation

## 2023-06-11 DIAGNOSIS — Z800 Family history of malignant neoplasm of digestive organs: Secondary | ICD-10-CM | POA: Diagnosis not present

## 2023-06-11 DIAGNOSIS — N83201 Unspecified ovarian cyst, right side: Secondary | ICD-10-CM | POA: Diagnosis not present

## 2023-06-11 DIAGNOSIS — Z79899 Other long term (current) drug therapy: Secondary | ICD-10-CM | POA: Diagnosis not present

## 2023-06-11 DIAGNOSIS — G2581 Restless legs syndrome: Secondary | ICD-10-CM | POA: Insufficient documentation

## 2023-06-11 DIAGNOSIS — Z803 Family history of malignant neoplasm of breast: Secondary | ICD-10-CM | POA: Diagnosis not present

## 2023-06-11 DIAGNOSIS — Z801 Family history of malignant neoplasm of trachea, bronchus and lung: Secondary | ICD-10-CM | POA: Diagnosis not present

## 2023-06-11 DIAGNOSIS — Z808 Family history of malignant neoplasm of other organs or systems: Secondary | ICD-10-CM | POA: Insufficient documentation

## 2023-06-11 DIAGNOSIS — K769 Liver disease, unspecified: Secondary | ICD-10-CM | POA: Insufficient documentation

## 2023-06-11 LAB — CBC WITH DIFFERENTIAL (CANCER CENTER ONLY)
Abs Immature Granulocytes: 0.07 10*3/uL (ref 0.00–0.07)
Basophils Absolute: 0.1 10*3/uL (ref 0.0–0.1)
Basophils Relative: 1 %
Eosinophils Absolute: 0.2 10*3/uL (ref 0.0–0.5)
Eosinophils Relative: 3 %
HCT: 43.1 % (ref 36.0–46.0)
Hemoglobin: 13.6 g/dL (ref 12.0–15.0)
Immature Granulocytes: 1 %
Lymphocytes Relative: 22 %
Lymphs Abs: 1.9 10*3/uL (ref 0.7–4.0)
MCH: 28.8 pg (ref 26.0–34.0)
MCHC: 31.6 g/dL (ref 30.0–36.0)
MCV: 91.3 fL (ref 80.0–100.0)
Monocytes Absolute: 0.7 10*3/uL (ref 0.1–1.0)
Monocytes Relative: 8 %
Neutro Abs: 5.7 10*3/uL (ref 1.7–7.7)
Neutrophils Relative %: 65 %
Platelet Count: 465 10*3/uL — ABNORMAL HIGH (ref 150–400)
RBC: 4.72 MIL/uL (ref 3.87–5.11)
RDW: 13 % (ref 11.5–15.5)
WBC Count: 8.6 10*3/uL (ref 4.0–10.5)
nRBC: 0 % (ref 0.0–0.2)

## 2023-06-11 LAB — IRON AND IRON BINDING CAPACITY (CC-WL,HP ONLY)
Iron: 88 ug/dL (ref 28–170)
Saturation Ratios: 15 % (ref 10.4–31.8)
TIBC: 588 ug/dL — ABNORMAL HIGH (ref 250–450)
UIBC: 500 ug/dL — ABNORMAL HIGH (ref 148–442)

## 2023-06-11 LAB — FERRITIN: Ferritin: 63 ng/mL (ref 11–307)

## 2023-06-11 NOTE — Progress Notes (Signed)
 [facility] Telephone:([phone]) [phone]   [phone]  PROGRESS NOTE  Patient Care Team: Chilton Greathouse, MD as PCP - General (Internal Medicine)  CHIEF COMPLAINTS/PURPOSE OF CONSULTATION:  Iron deficiency anemia  TREATMENT HISTORY: -Received IV feraheme x 2 doses on 05/04/2020 and 05/11/2020 -Received IV venofer x 5 doses on 01/04/2021-02/01/2021 -Received IV venofer x 5 doses on 03/21/2022-04/18/2022.  -Received IV venofer x 5 doses on 06/22/2022-07/20/2022.  -Received IV venofer x 2 doses on 08/14/2022-08/21/2022.  -Received IV monoferric x 1 dose on 09/24/2022 -Received IV monoferric x 1 dose on 11/13/2022.   HISTORY OF PRESENTING ILLNESS:     [name] 43 y.o. female returns for a follow up for iron deficiency anemia. She was last seen on 12/04/2022. She presents today unaccompanied.   [name] reports her energy levels are persistent and continues to impact her ADLs. She denies any changes to her appetite or weight. She reports having occasional episodes of nausea and persistent bowel issues with loose stools and diarrhea. She denies any overt bleeding including hematochezia or melena. She is compliant with the medications prescribed by her GI team. She is struggling with restless leg syndrome and recently increased dose of ropinirole with some improvement.  She denies any fevers, chills, shortness of breath, chest pain or cough. She has no other complaints.   Rest of the pertinent 10 point ROS reviewed and negative.  REVIEW OF SYSTEMS:   Constitutional: Denies fevers, chills or abnormal night sweats Eyes: Denies blurriness of vision, double vision or watery eyes Ears, nose, mouth, throat, and face: Denies mucositis or sore throat Respiratory: Denies cough, dyspnea or wheezes Cardiovascular: Denies palpitation, chest discomfort or lower extremity swelling Gastrointestinal:  +Chronic nausea intermittently. +Diarrhea Skin: Denies abnormal skin  rashes Lymphatics: Denies new lymphadenopathy or easy bruising Neurological:Denies numbness, tingling or new weaknesses Behavioral/Psych: Mood is stable, no new changes   All other systems were reviewed with the patient and are negative.  MEDICAL HISTORY:  Past Medical History:  Diagnosis Date   ADHD (attention deficit hyperactivity disorder)    Allergy    Anemia    Anxiety    Arthritis, lumbar spine    Bipolar disorder (HCC)    Chronic back pain    Chronic headaches    Chronic interstitial cystitis    "removed my bladder when I went to end stage IC"   Depression    Disc degeneration, lumbar 01/10/2022   Fibromyalgia    GERD (gastroesophageal reflux disease)    Gestational diabetes    resolved   History of blood transfusion 02/2010   "related to OR"   History of kidney stones    HLD (hyperlipidemia)    Hypothyroid    IBS (irritable colon syndrome)    IDA (iron deficiency anemia)    Interstitial cystitis 06/2008   Dr. Andrey Spearman [facility]   Polycystic ovarian disease    Preeclampsia 2009   Self-catheterizes urinary bladder    "I have an [place] pouch; made a bladder using part of my small intestines; cath out of stoma in my belly button" (08/02/2015)   Small bowel obstruction (HCC) 08/03/2015   Spina bifida (HCC) 01/10/2022   Thyroid disease    Vitamin D deficiency     SURGICAL HISTORY: Past Surgical History:  Procedure Laterality Date   ABDOMINAL HYSTERECTOMY  02/2010   "left my ovaries"   APPENDECTOMY  02/2010   BLADDER REMOVAL  02/2010   and [place] Pouch   CESAREAN SECTION  2009  CHOLECYSTECTOMY N/A 05/27/2017   Procedure: LAPAROSCOPIC CHOLECYSTECTOMY;  Surgeon: Axel Filler, MD;  Location: [facility];  Service: General;  Laterality: N/A;   COLONOSCOPY     ESOPHAGOGASTRODUODENOSCOPY (EGD) WITH PROPOFOL N/A 07/25/2022   Procedure: ESOPHAGOGASTRODUODENOSCOPY (EGD) WITH PROPOFOL;  Surgeon: Benancio Deeds, MD;  Location: [facility] ENDOSCOPY;  Service:  Gastroenterology;  Laterality: N/A;   HOT HEMOSTASIS N/A 07/25/2022   Procedure: HOT HEMOSTASIS (ARGON PLASMA COAGULATION/BICAP);  Surgeon: Benancio Deeds, MD;  Location: [facility] ENDOSCOPY;  Service: Gastroenterology;  Laterality: N/A;   [place] pouch  01/19/2021   Cystoscopy with bulk amid injection   LAPAROSCOPIC LYSIS OF ADHESIONS N/A 05/27/2017   Procedure: LAPAROSCOPIC LYSIS OF ADHESIONS;  Surgeon: Axel Filler, MD;  Location: [facility] OR;  Service: General;  Laterality: N/A;   OTHER SURGICAL HISTORY     remval of urethra when bladder was removed   SMALL INTESTINE SURGERY  2017   for bowel obstruction   SPINAL CORD STIMULATOR IMPLANT  09/2009; ~ 04/2015   SPINAL CORD STIMULATOR REMOVAL  ~ 04/2015   [facility] ENDOSCOPY     WISDOM TOOTH EXTRACTION      SOCIAL HISTORY: Social History   Socioeconomic History   Marital status: Married    Spouse name: Not on file   Number of children: 1   Years of education: Not on file   Highest education level: Not on file  Occupational History   Occupation: Disabled  Tobacco Use   Smoking status: Never   Smokeless tobacco: Never  Vaping Use   Vaping status: Never Used  Substance and Sexual Activity   Alcohol use: Yes    Comment: 08/03/2015 "glass of wine maybe twice/month"   Drug use: No   Sexual activity: Yes    Comment: hysterectomy  Other Topics Concern   Not on file  Social History Narrative   Not on file   Social Drivers of Health   Financial Resource Strain: Not on file  Food Insecurity: Not on file  Transportation Needs: Not on file  Physical Activity: Not on file  Stress: Not on file  Social Connections: Unknown (07/15/2021)   [facility] from [facility]   Social Network    Social Network: Not on file  Intimate Partner Violence: Unknown (06/07/2021)   Received from [employer], [facility]   HITS    Physically Hurt: Not on file    Insult or Talk Down To: Not on file    Threaten Physical Harm: Not on file     Scream or Curse: Not on file    FAMILY HISTORY: Family History  Problem Relation Age of Onset   Lung cancer Mother        smoker   Other Father        benigh tumor between heart and lungs, had surgery and something went wrong, he died at 10   Diabetes Paternal Grandmother    Heart disease Paternal Grandmother    Diabetes Paternal Grandfather    Heart disease Paternal Grandfather    Pancreatic cancer Paternal Grandfather    Liver cancer Paternal Grandfather    Breast cancer Maternal Aunt    Colon cancer Neg Hx    Esophageal cancer Neg Hx    Prostate cancer Neg Hx    Rectal cancer Neg Hx    Stomach cancer Neg Hx     ALLERGIES:  is allergic to keflex [cephalexin], penicillins, and sulfa antibiotics.  MEDICATIONS:  Current Outpatient Medications  Medication Sig Dispense Refill  acetaminophen (TYLENOL) 650 MG CR tablet Take 1,300 mg by mouth 2 (two) times daily.     amantadine (SYMMETREL) 100 MG capsule Take 100 mg by mouth 2 (two) times daily.     amphetamine-dextroamphetamine (ADDERALL) 30 MG tablet Take 30 mg by mouth 2 (two) times daily.     AUSTEDO XR 36 MG TB24      clonazePAM (KLONOPIN) 2 MG tablet Take 1 mg by mouth 3 (three) times daily as needed for anxiety.     D3-50 1.25 MG (50000 UT) capsule Take 50,000 Units by mouth 3 (three) times a week.     dicyclomine (BENTYL) 10 MG capsule TAKE 1 CAPSULE BY MOUTH TWICE A DAY 180 capsule 1   diphenoxylate-atropine (LOMOTIL) 2.5-0.025 MG tablet Take 1 tablet by mouth 4 (four) times daily as needed for diarrhea or loose stools (IBS). 120 tablet 2   fenofibrate 160 MG tablet Take 160 mg by mouth at bedtime.     icosapent Ethyl (VASCEPA) 1 g capsule Take 2 g by mouth 2 (two) times daily.     lamoTRIgine (LAMICTAL) 200 MG tablet Take 400 mg by mouth at bedtime.     levonorgestrel-ethinyl estradiol (SEASONALE,INTROVALE,JOLESSA) 0.15-0.03 MG tablet Take 1 tablet by mouth in the morning.     levothyroxine (SYNTHROID) 125 MCG tablet  Take 125 mcg by mouth daily before breakfast.     lithium carbonate (ESKALITH) 450 MG ER tablet Take 675 mg by mouth at bedtime.     metoprolol succinate (TOPROL-XL) 100 MG 24 hr tablet Take 100 mg by mouth at bedtime.     nitrofurantoin (MACRODANTIN) 50 MG capsule Take 50 mg by mouth at bedtime.     nystatin cream (MYCOSTATIN) Apply 1 Application topically daily as needed (vaginal itching).     omeprazole (PRILOSEC) 40 MG capsule Take 1 capsule (40 mg total) by mouth daily.     ondansetron (ZOFRAN) 4 MG tablet Take 2-4 mg by mouth every 8 (eight) hours as needed for vomiting or nausea.     oxybutynin (DITROPAN) 5 MG/5ML syrup 5 mg 2 (two) times daily as needed. Per [place] pouch     OZEMPIC, 0.25 OR 0.5 MG/DOSE, 2 MG/3ML SOPN      ramelteon (ROZEREM) 8 MG tablet Take 1 tablet by mouth at bedtime.     rOPINIRole (REQUIP) 2 MG tablet Take 2 mg by mouth at bedtime.     rosuvastatin (CRESTOR) 5 MG tablet Take 1 tablet by mouth at bedtime.     Safety Seal Miscellaneous MISC Apply 1 Application topically in the morning. Medication Name: Hormonic Hair Solution (Minoxidil 8%, Finasteride 0.05%) 30 g 6   sucralfate (CARAFATE) 1 g tablet TAKE 1 TABLET BY MOUTH EVERY 6 HOURS AS NEEDED. 60 tablet 0   telmisartan (MICARDIS) 40 MG tablet Take 40 mg by mouth at bedtime.     tiZANidine (ZANAFLEX) 4 MG tablet Take 8 mg by mouth 2 (two) times daily.     triamcinolone cream (KENALOG) 0.1 % Apply 1 Application topically daily as needed (itching).     HYDROcodone-acetaminophen (NORCO) 7.5-325 MG tablet Take 1 tablet by mouth every 4 (four) hours as needed.     lumateperone tosylate (CAPLYTA) 42 MG capsule Take 42 mg by mouth at bedtime. (Patient not taking: Reported on 12/27/2022)     Current Facility-Administered Medications  Medication Dose Route Frequency Provider Last Rate Last Admin   acetaminophen (TYLENOL) tablet 650 mg  650 mg Oral Once Briant Cedar, PA-C  diphenhydrAMINE (BENADRYL) capsule 25 mg   25 mg Oral Once Peyten Weare T, PA-C         PHYSICAL EXAMINATION:  ECOG PERFORMANCE STATUS: 1 - Symptomatic but completely ambulatory  Vitals:   06/11/23 1058  BP: 137/82  Pulse: 72  Resp: 13  Temp: (!) 96.8 F (36 C)  SpO2: 100%   Filed Weights   06/11/23 1058  Weight: 228 lb 9.6 oz (103.7 kg)    GENERAL:alert, no distress and comfortable SKIN:, .  skin color, texture, turgor are normal, no rashes or significant lesions.  EYES: normal, conjunctiva are pink and non-injected, sclera clear LUNGS: clear to auscultation and percussion with normal breathing effort HEART: regular rate & rhythm and no murmurs and no lower extremity edema Musculoskeletal:no cyanosis of digits and no clubbing  PSYCH: alert & oriented x 3 with fluent speech NEURO: no focal motor/sensory deficits  LABORATORY DATA:  I have reviewed the data as listed Lab Results  Component Value Date   WBC 8.6 06/11/2023   HGB 13.6 06/11/2023   HCT 43.1 06/11/2023   MCV 91.3 06/11/2023   PLT 465 (H) 06/11/2023     Chemistry      Component Value Date/Time   NA 137 02/20/2023 1315   K 4.3 02/20/2023 1315   CL 103 02/20/2023 1315   CO2 19 (L) 02/20/2023 1315   BUN 17 02/20/2023 1315   CREATININE 1.00 04/23/2023 0746   CREATININE 0.82 07/24/2022 0954      Component Value Date/Time   CALCIUM 10.3 02/20/2023 1315   ALKPHOS 36 (L) 04/18/2023 1050   AST 27 04/18/2023 1050   AST 21 07/24/2022 0954   ALT 30 04/18/2023 1050   ALT 20 07/24/2022 0954   BILITOT 0.4 04/18/2023 1050   BILITOT 0.4 07/24/2022 0954      RADIOGRAPHIC STUDIES: I have personally reviewed the radiological images as listed and agreed with the findings in the report. No results found.   ASSESSMENT & PLAN:  [name] is a 43 y.o. female who returns for follow-up for iron deficiency anemia.    #Iron deficiency anemia: --Unable to tolerate oral iron due to GI intolerance --Underwent EGD on 07/25/22. Findings suggest mild  gastric vacular ectasia which was quite friable and underlying source of IDA. Treated with APC.  --Under the care of Dr. Adela Lank in GI --Last received IV monoferric 1000 mg x 1 dose on 11/13/2022.  --Labs today show no evidence of anemia with Hgb 13.6, MCV 91.3. Iron panel shows iron 88, TIBC 588, saturation 15%, ferritin 63. --No need for additional IV iron at this time. --We will add pre-meds to next IV monoferric infusion to try to prevent peeling of skin.  --RTC for monthly labs x 3 and follow up in 3 months.   #Liver lesions: --Seen on MR liver from 05/08/2023. Differentials include FNH or adenomas. AFP level was normal.  --Under surveillance by GI (Dr. Adela Lank) with plans for repeat MR liver in 6 months  #Right Ovarian Cyst: --Seen on US pelvic US on 05/08/2023. Planning to follow up with OB/GYN to continue to monitor.   All questions were answered. The patient knows to call the clinic with any problems, questions or concerns.  I have spent a total of 25 minutes minutes of face-to-face and non-face-to-face time, preparing to see the patient,  performing a medically appropriate examination, counseling and educating the patient, ordering meds,  documenting clinical information in the electronic health record, and care coordination.  Briant Cedar, PA-C Hematology and Oncology [facility]

## 2023-06-25 DIAGNOSIS — F4312 Post-traumatic stress disorder, chronic: Secondary | ICD-10-CM | POA: Diagnosis not present

## 2023-06-25 DIAGNOSIS — F5101 Primary insomnia: Secondary | ICD-10-CM | POA: Diagnosis not present

## 2023-06-25 DIAGNOSIS — F410 Panic disorder [episodic paroxysmal anxiety] without agoraphobia: Secondary | ICD-10-CM | POA: Diagnosis not present

## 2023-06-25 DIAGNOSIS — F3181 Bipolar II disorder: Secondary | ICD-10-CM | POA: Diagnosis not present

## 2023-06-26 DIAGNOSIS — R339 Retention of urine, unspecified: Secondary | ICD-10-CM | POA: Diagnosis not present

## 2023-07-01 DIAGNOSIS — F4312 Post-traumatic stress disorder, chronic: Secondary | ICD-10-CM | POA: Diagnosis not present

## 2023-07-01 DIAGNOSIS — F3181 Bipolar II disorder: Secondary | ICD-10-CM | POA: Diagnosis not present

## 2023-07-01 DIAGNOSIS — F5101 Primary insomnia: Secondary | ICD-10-CM | POA: Diagnosis not present

## 2023-07-01 DIAGNOSIS — F410 Panic disorder [episodic paroxysmal anxiety] without agoraphobia: Secondary | ICD-10-CM | POA: Diagnosis not present

## 2023-07-08 DIAGNOSIS — F4312 Post-traumatic stress disorder, chronic: Secondary | ICD-10-CM | POA: Diagnosis not present

## 2023-07-08 DIAGNOSIS — F5101 Primary insomnia: Secondary | ICD-10-CM | POA: Diagnosis not present

## 2023-07-08 DIAGNOSIS — F3181 Bipolar II disorder: Secondary | ICD-10-CM | POA: Diagnosis not present

## 2023-07-08 DIAGNOSIS — F410 Panic disorder [episodic paroxysmal anxiety] without agoraphobia: Secondary | ICD-10-CM | POA: Diagnosis not present

## 2023-07-09 DIAGNOSIS — F3176 Bipolar disorder, in full remission, most recent episode depressed: Secondary | ICD-10-CM | POA: Diagnosis not present

## 2023-07-09 DIAGNOSIS — Z9889 Other specified postprocedural states: Secondary | ICD-10-CM | POA: Diagnosis not present

## 2023-07-09 DIAGNOSIS — N390 Urinary tract infection, site not specified: Secondary | ICD-10-CM | POA: Diagnosis not present

## 2023-07-09 DIAGNOSIS — Z906 Acquired absence of other parts of urinary tract: Secondary | ICD-10-CM | POA: Diagnosis not present

## 2023-07-09 DIAGNOSIS — G2401 Drug induced subacute dyskinesia: Secondary | ICD-10-CM | POA: Diagnosis not present

## 2023-07-09 DIAGNOSIS — F3174 Bipolar disorder, in full remission, most recent episode manic: Secondary | ICD-10-CM | POA: Diagnosis not present

## 2023-07-10 ENCOUNTER — Ambulatory Visit (HOSPITAL_COMMUNITY)
Admission: RE | Admit: 2023-07-10 | Discharge: 2023-07-10 | Disposition: A | Discharge status: Home / Self Care | Source: Ambulatory Visit | Attending: Obstetrics & Gynecology | Admitting: Obstetrics & Gynecology

## 2023-07-10 DIAGNOSIS — N83209 Unspecified ovarian cyst, unspecified side: Secondary | ICD-10-CM | POA: Diagnosis not present

## 2023-07-10 DIAGNOSIS — N83201 Unspecified ovarian cyst, right side: Secondary | ICD-10-CM | POA: Insufficient documentation

## 2023-07-10 DIAGNOSIS — Z90710 Acquired absence of both cervix and uterus: Secondary | ICD-10-CM | POA: Diagnosis not present

## 2023-07-11 DIAGNOSIS — Z130 Encounter for screening for diseases of the blood and blood-forming organs and certain disorders involving the immune mechanism: Secondary | ICD-10-CM | POA: Diagnosis not present

## 2023-07-11 DIAGNOSIS — Z79899 Other long term (current) drug therapy: Secondary | ICD-10-CM | POA: Diagnosis not present

## 2023-07-15 DIAGNOSIS — F5101 Primary insomnia: Secondary | ICD-10-CM | POA: Diagnosis not present

## 2023-07-15 DIAGNOSIS — F4312 Post-traumatic stress disorder, chronic: Secondary | ICD-10-CM | POA: Diagnosis not present

## 2023-07-15 DIAGNOSIS — F410 Panic disorder [episodic paroxysmal anxiety] without agoraphobia: Secondary | ICD-10-CM | POA: Diagnosis not present

## 2023-07-15 DIAGNOSIS — F3181 Bipolar II disorder: Secondary | ICD-10-CM | POA: Diagnosis not present

## 2023-07-16 DIAGNOSIS — Z01419 Encounter for gynecological examination (general) (routine) without abnormal findings: Secondary | ICD-10-CM | POA: Diagnosis not present

## 2023-07-21 ENCOUNTER — Encounter: Payer: Self-pay | Admitting: Gastroenterology

## 2023-07-22 ENCOUNTER — Inpatient Hospital Stay (HOSPITAL_COMMUNITY)
Admission: EM | Admit: 2023-07-22 | Discharge: 2023-07-24 | DRG: 388 | Disposition: A | Discharge status: Home / Self Care | Attending: Internal Medicine | Admitting: Internal Medicine

## 2023-07-22 ENCOUNTER — Inpatient Hospital Stay (HOSPITAL_COMMUNITY)

## 2023-07-22 ENCOUNTER — Telehealth: Payer: Self-pay | Admitting: Gastroenterology

## 2023-07-22 ENCOUNTER — Emergency Department (HOSPITAL_COMMUNITY)

## 2023-07-22 ENCOUNTER — Other Ambulatory Visit: Payer: Self-pay

## 2023-07-22 DIAGNOSIS — Q631 Lobulated, fused and horseshoe kidney: Secondary | ICD-10-CM

## 2023-07-22 DIAGNOSIS — K56609 Unspecified intestinal obstruction, unspecified as to partial versus complete obstruction: Secondary | ICD-10-CM | POA: Diagnosis not present

## 2023-07-22 DIAGNOSIS — K589 Irritable bowel syndrome without diarrhea: Secondary | ICD-10-CM | POA: Diagnosis present

## 2023-07-22 DIAGNOSIS — Z79890 Hormone replacement therapy (postmenopausal): Secondary | ICD-10-CM | POA: Diagnosis not present

## 2023-07-22 DIAGNOSIS — Z833 Family history of diabetes mellitus: Secondary | ICD-10-CM

## 2023-07-22 DIAGNOSIS — N179 Acute kidney failure, unspecified: Secondary | ICD-10-CM | POA: Diagnosis not present

## 2023-07-22 DIAGNOSIS — Z79899 Other long term (current) drug therapy: Secondary | ICD-10-CM

## 2023-07-22 DIAGNOSIS — K219 Gastro-esophageal reflux disease without esophagitis: Secondary | ICD-10-CM | POA: Diagnosis present

## 2023-07-22 DIAGNOSIS — D751 Secondary polycythemia: Secondary | ICD-10-CM | POA: Diagnosis not present

## 2023-07-22 DIAGNOSIS — E66812 Obesity, class 2: Secondary | ICD-10-CM | POA: Diagnosis present

## 2023-07-22 DIAGNOSIS — Z7985 Long-term (current) use of injectable non-insulin antidiabetic drugs: Secondary | ICD-10-CM | POA: Diagnosis not present

## 2023-07-22 DIAGNOSIS — Z8249 Family history of ischemic heart disease and other diseases of the circulatory system: Secondary | ICD-10-CM

## 2023-07-22 DIAGNOSIS — G8929 Other chronic pain: Secondary | ICD-10-CM | POA: Diagnosis present

## 2023-07-22 DIAGNOSIS — Z881 Allergy status to other antibiotic agents status: Secondary | ICD-10-CM

## 2023-07-22 DIAGNOSIS — Z906 Acquired absence of other parts of urinary tract: Secondary | ICD-10-CM

## 2023-07-22 DIAGNOSIS — R739 Hyperglycemia, unspecified: Secondary | ICD-10-CM | POA: Diagnosis present

## 2023-07-22 DIAGNOSIS — F319 Bipolar disorder, unspecified: Secondary | ICD-10-CM | POA: Diagnosis present

## 2023-07-22 DIAGNOSIS — M797 Fibromyalgia: Secondary | ICD-10-CM | POA: Diagnosis not present

## 2023-07-22 DIAGNOSIS — R000 Tachycardia, unspecified: Secondary | ICD-10-CM | POA: Diagnosis present

## 2023-07-22 DIAGNOSIS — E559 Vitamin D deficiency, unspecified: Secondary | ICD-10-CM | POA: Diagnosis present

## 2023-07-22 DIAGNOSIS — E785 Hyperlipidemia, unspecified: Secondary | ICD-10-CM | POA: Diagnosis present

## 2023-07-22 DIAGNOSIS — K31811 Angiodysplasia of stomach and duodenum with bleeding: Secondary | ICD-10-CM | POA: Diagnosis present

## 2023-07-22 DIAGNOSIS — K922 Gastrointestinal hemorrhage, unspecified: Secondary | ICD-10-CM | POA: Diagnosis not present

## 2023-07-22 DIAGNOSIS — F32A Depression, unspecified: Secondary | ICD-10-CM | POA: Diagnosis present

## 2023-07-22 DIAGNOSIS — Z98891 History of uterine scar from previous surgery: Secondary | ICD-10-CM

## 2023-07-22 DIAGNOSIS — Z87442 Personal history of urinary calculi: Secondary | ICD-10-CM

## 2023-07-22 DIAGNOSIS — Z8632 Personal history of gestational diabetes: Secondary | ICD-10-CM

## 2023-07-22 DIAGNOSIS — E872 Acidosis, unspecified: Secondary | ICD-10-CM | POA: Diagnosis not present

## 2023-07-22 DIAGNOSIS — K920 Hematemesis: Secondary | ICD-10-CM | POA: Diagnosis not present

## 2023-07-22 DIAGNOSIS — R112 Nausea with vomiting, unspecified: Secondary | ICD-10-CM | POA: Diagnosis not present

## 2023-07-22 DIAGNOSIS — Z90710 Acquired absence of both cervix and uterus: Secondary | ICD-10-CM

## 2023-07-22 DIAGNOSIS — Z9049 Acquired absence of other specified parts of digestive tract: Secondary | ICD-10-CM | POA: Diagnosis not present

## 2023-07-22 DIAGNOSIS — Z4682 Encounter for fitting and adjustment of non-vascular catheter: Secondary | ICD-10-CM | POA: Diagnosis not present

## 2023-07-22 DIAGNOSIS — K56600 Partial intestinal obstruction, unspecified as to cause: Secondary | ICD-10-CM | POA: Diagnosis not present

## 2023-07-22 DIAGNOSIS — Z882 Allergy status to sulfonamides status: Secondary | ICD-10-CM | POA: Diagnosis not present

## 2023-07-22 DIAGNOSIS — F909 Attention-deficit hyperactivity disorder, unspecified type: Secondary | ICD-10-CM | POA: Diagnosis present

## 2023-07-22 DIAGNOSIS — E039 Hypothyroidism, unspecified: Secondary | ICD-10-CM | POA: Diagnosis not present

## 2023-07-22 DIAGNOSIS — E860 Dehydration: Secondary | ICD-10-CM | POA: Diagnosis present

## 2023-07-22 DIAGNOSIS — N3010 Interstitial cystitis (chronic) without hematuria: Secondary | ICD-10-CM | POA: Diagnosis not present

## 2023-07-22 DIAGNOSIS — E876 Hypokalemia: Secondary | ICD-10-CM | POA: Diagnosis not present

## 2023-07-22 DIAGNOSIS — R109 Unspecified abdominal pain: Secondary | ICD-10-CM | POA: Diagnosis not present

## 2023-07-22 DIAGNOSIS — M47816 Spondylosis without myelopathy or radiculopathy, lumbar region: Secondary | ICD-10-CM | POA: Diagnosis present

## 2023-07-22 DIAGNOSIS — Q059 Spina bifida, unspecified: Secondary | ICD-10-CM | POA: Diagnosis not present

## 2023-07-22 DIAGNOSIS — K5669 Other partial intestinal obstruction: Secondary | ICD-10-CM | POA: Diagnosis not present

## 2023-07-22 DIAGNOSIS — M359 Systemic involvement of connective tissue, unspecified: Secondary | ICD-10-CM | POA: Diagnosis present

## 2023-07-22 DIAGNOSIS — M549 Dorsalgia, unspecified: Secondary | ICD-10-CM | POA: Diagnosis present

## 2023-07-22 DIAGNOSIS — Z87440 Personal history of urinary (tract) infections: Secondary | ICD-10-CM

## 2023-07-22 DIAGNOSIS — D509 Iron deficiency anemia, unspecified: Secondary | ICD-10-CM | POA: Diagnosis not present

## 2023-07-22 DIAGNOSIS — K31819 Angiodysplasia of stomach and duodenum without bleeding: Secondary | ICD-10-CM | POA: Diagnosis present

## 2023-07-22 DIAGNOSIS — E282 Polycystic ovarian syndrome: Secondary | ICD-10-CM | POA: Diagnosis present

## 2023-07-22 DIAGNOSIS — F419 Anxiety disorder, unspecified: Secondary | ICD-10-CM | POA: Diagnosis present

## 2023-07-22 DIAGNOSIS — Z880 Allergy status to penicillin: Secondary | ICD-10-CM

## 2023-07-22 DIAGNOSIS — Z793 Long term (current) use of hormonal contraceptives: Secondary | ICD-10-CM

## 2023-07-22 DIAGNOSIS — Z6835 Body mass index (BMI) 35.0-35.9, adult: Secondary | ICD-10-CM

## 2023-07-22 LAB — CBC WITH DIFFERENTIAL/PLATELET
Abs Immature Granulocytes: 0.06 10*3/uL (ref 0.00–0.07)
Basophils Absolute: 0.1 10*3/uL (ref 0.0–0.1)
Basophils Relative: 1 %
Eosinophils Absolute: 0.1 10*3/uL (ref 0.0–0.5)
Eosinophils Relative: 1 %
HCT: 55.2 % — ABNORMAL HIGH (ref 36.0–46.0)
Hemoglobin: 16.9 g/dL — ABNORMAL HIGH (ref 12.0–15.0)
Immature Granulocytes: 1 %
Lymphocytes Relative: 13 %
Lymphs Abs: 1.2 10*3/uL (ref 0.7–4.0)
MCH: 28.6 pg (ref 26.0–34.0)
MCHC: 30.6 g/dL (ref 30.0–36.0)
MCV: 93.4 fL (ref 80.0–100.0)
Monocytes Absolute: 1.2 10*3/uL — ABNORMAL HIGH (ref 0.1–1.0)
Monocytes Relative: 14 %
Neutro Abs: 6.3 10*3/uL (ref 1.7–7.7)
Neutrophils Relative %: 70 %
Platelets: 569 10*3/uL — ABNORMAL HIGH (ref 150–400)
RBC: 5.91 MIL/uL — ABNORMAL HIGH (ref 3.87–5.11)
RDW: 13.3 % (ref 11.5–15.5)
WBC: 9 10*3/uL (ref 4.0–10.5)
nRBC: 0 % (ref 0.0–0.2)

## 2023-07-22 LAB — TYPE AND SCREEN
ABO/RH(D): O POS
Antibody Screen: NEGATIVE

## 2023-07-22 LAB — I-STAT CHEM 8, ED
BUN: 28 mg/dL — ABNORMAL HIGH (ref 6–20)
Calcium, Ion: 1.26 mmol/L (ref 1.15–1.40)
Chloride: 106 mmol/L (ref 98–111)
Creatinine, Ser: 1.8 mg/dL — ABNORMAL HIGH (ref 0.44–1.00)
Glucose, Bld: 172 mg/dL — ABNORMAL HIGH (ref 70–99)
HCT: 55 % — ABNORMAL HIGH (ref 36.0–46.0)
Hemoglobin: 18.7 g/dL — ABNORMAL HIGH (ref 12.0–15.0)
Potassium: 4.6 mmol/L (ref 3.5–5.1)
Sodium: 134 mmol/L — ABNORMAL LOW (ref 135–145)
TCO2: 19 mmol/L — ABNORMAL LOW (ref 22–32)

## 2023-07-22 LAB — COMPREHENSIVE METABOLIC PANEL WITH GFR
ALT: 23 U/L (ref 0–44)
AST: 31 U/L (ref 15–41)
Albumin: 5 g/dL (ref 3.5–5.0)
Alkaline Phosphatase: 37 U/L — ABNORMAL LOW (ref 38–126)
Anion gap: 17 — ABNORMAL HIGH (ref 5–15)
BUN: 23 mg/dL — ABNORMAL HIGH (ref 6–20)
CO2: 15 mmol/L — ABNORMAL LOW (ref 22–32)
Calcium: 10.8 mg/dL — ABNORMAL HIGH (ref 8.9–10.3)
Chloride: 101 mmol/L (ref 98–111)
Creatinine, Ser: 1.91 mg/dL — ABNORMAL HIGH (ref 0.44–1.00)
GFR, Estimated: 33 mL/min — ABNORMAL LOW (ref >60)
Glucose, Bld: 168 mg/dL — ABNORMAL HIGH (ref 70–99)
Potassium: 4.7 mmol/L (ref 3.5–5.1)
Sodium: 133 mmol/L — ABNORMAL LOW (ref 135–145)
Total Bilirubin: 1.2 mg/dL (ref 0.0–1.2)
Total Protein: 8.9 g/dL — ABNORMAL HIGH (ref 6.5–8.1)

## 2023-07-22 LAB — ABO/RH: ABO/RH(D): O POS

## 2023-07-22 LAB — LIPASE, BLOOD: Lipase: 30 U/L (ref 11–51)

## 2023-07-22 LAB — LACTIC ACID, PLASMA: Lactic Acid, Venous: 1.7 mmol/L (ref 0.5–1.9)

## 2023-07-22 LAB — MAGNESIUM: Magnesium: 2 mg/dL (ref 1.7–2.4)

## 2023-07-22 LAB — HEMOGLOBIN A1C
Hgb A1c MFr Bld: 5.2 % (ref 4.8–5.6)
Mean Plasma Glucose: 102.54 mg/dL

## 2023-07-22 LAB — I-STAT CG4 LACTIC ACID, ED: Lactic Acid, Venous: 1.9 mmol/L (ref 0.5–1.9)

## 2023-07-22 LAB — TROPONIN I (HIGH SENSITIVITY): Troponin I (High Sensitivity): 7 ng/L (ref <18)

## 2023-07-22 MED ORDER — ONDANSETRON HCL 4 MG/2ML IJ SOLN
4.0000 mg | Freq: Once | INTRAMUSCULAR | Status: AC
  Administered 2023-07-22: 4 mg via INTRAVENOUS
  Filled 2023-07-22: qty 2

## 2023-07-22 MED ORDER — PROCHLORPERAZINE EDISYLATE 10 MG/2ML IJ SOLN
5.0000 mg | Freq: Four times a day (QID) | INTRAMUSCULAR | Status: DC | PRN
  Administered 2023-07-22 – 2023-07-23 (×3): 5 mg via INTRAVENOUS
  Filled 2023-07-22 (×3): qty 2

## 2023-07-22 MED ORDER — SODIUM CHLORIDE 0.9 % IV SOLN: INTRAVENOUS | Status: DC

## 2023-07-22 MED ORDER — ONDANSETRON HCL 4 MG/2ML IJ SOLN
4.0000 mg | Freq: Four times a day (QID) | INTRAMUSCULAR | Status: DC | PRN
  Administered 2023-07-23 (×2): 4 mg via INTRAVENOUS
  Filled 2023-07-22 (×3): qty 2

## 2023-07-22 MED ORDER — HYDROMORPHONE HCL 1 MG/ML IJ SOLN
0.5000 mg | INTRAMUSCULAR | Status: DC | PRN
  Administered 2023-07-23: 0.5 mg via INTRAVENOUS
  Filled 2023-07-22: qty 1

## 2023-07-22 MED ORDER — FENTANYL CITRATE PF 50 MCG/ML IJ SOSY
50.0000 ug | PREFILLED_SYRINGE | Freq: Once | INTRAMUSCULAR | Status: DC
  Filled 2023-07-22: qty 1

## 2023-07-22 MED ORDER — INSULIN ASPART 100 UNIT/ML IJ SOLN: 0.0000 [IU] | INTRAMUSCULAR | Status: DC

## 2023-07-22 MED ORDER — IOPAMIDOL (ISOVUE-370) INJECTION 76%
75.0000 mL | Freq: Once | INTRAVENOUS | Status: AC | PRN
  Administered 2023-07-22: 75 mL via INTRAVENOUS

## 2023-07-22 MED ORDER — DIATRIZOATE MEGLUMINE & SODIUM 66-10 % PO SOLN
90.0000 mL | Freq: Once | ORAL | Status: AC
  Administered 2023-07-23: 90 mL via NASOGASTRIC
  Filled 2023-07-22 (×2): qty 90

## 2023-07-22 MED ORDER — HYDROMORPHONE HCL 1 MG/ML IJ SOLN
0.5000 mg | INTRAMUSCULAR | Status: AC
  Administered 2023-07-22: 0.5 mg via INTRAVENOUS
  Filled 2023-07-22: qty 1

## 2023-07-22 MED ORDER — BISACODYL 10 MG RE SUPP: 10.0000 mg | Freq: Every day | RECTAL | Status: DC | PRN

## 2023-07-22 MED ORDER — PANTOPRAZOLE SODIUM 40 MG IV SOLR
80.0000 mg | Freq: Once | INTRAVENOUS | Status: AC
  Administered 2023-07-22: 80 mg via INTRAVENOUS
  Filled 2023-07-22: qty 20

## 2023-07-22 MED ORDER — LIDOCAINE VISCOUS HCL 2 % MT SOLN
15.0000 mL | Freq: Once | OROMUCOSAL | Status: AC
  Administered 2023-07-22: 15 mL via OROMUCOSAL
  Filled 2023-07-22: qty 15

## 2023-07-22 MED ORDER — FENTANYL CITRATE PF 50 MCG/ML IJ SOSY
50.0000 ug | PREFILLED_SYRINGE | Freq: Once | INTRAMUSCULAR | Status: AC
  Administered 2023-07-22: 50 ug via INTRAVENOUS
  Filled 2023-07-22: qty 1

## 2023-07-22 MED ORDER — LIDOCAINE HCL URETHRAL/MUCOSAL 2 % EX GEL: 1.0000 | Freq: Once | CUTANEOUS | Status: DC

## 2023-07-22 MED ORDER — SODIUM CHLORIDE 0.9 % IV BOLUS
1000.0000 mL | Freq: Once | INTRAVENOUS | Status: AC
  Administered 2023-07-22: 1000 mL via INTRAVENOUS

## 2023-07-22 NOTE — ED Provider Notes (Signed)
 [place] [facility]ERGENCY DEPARTMENT AT [facility] Provider Note   CSN: [id number] Arrival date & time: 07/22/23  1325     History  Chief Complaint  Patient presents with   Abdominal Pain    [name] is a 43 y.o. female.  HPI     Home Medications Prior to Admission medications   Medication Sig Start Date End Date Taking? Authorizing Provider  acetaminophen  (TYLENOL ) 650 MG CR tablet Take 1,300 mg by mouth 2 (two) times daily.    [provider]  amantadine (SYMMETREL) 100 MG capsule Take 100 mg by mouth 2 (two) times daily. 02/06/21   [provider]  amphetamine -dextroamphetamine  (ADDERALL) 30 MG tablet Take 30 mg by mouth 2 (two) times daily.    [provider]  AUSTEDO XR 36 MG TB24  02/07/23   [provider]  clonazePAM  (KLONOPIN ) 2 MG tablet Take 1 mg by mouth 3 (three) times daily as needed for anxiety. 08/03/20   [provider]  D3-50 1.25 MG (50000 UT) capsule Take 50,000 Units by mouth 3 (three) times a week. 12/12/20   [provider]  dicyclomine  (BENTYL ) 10 MG capsule TAKE 1 CAPSULE BY MOUTH TWICE A DAY 05/03/23   Armbruster, Lendon Queen, MD  diphenoxylate -atropine  (LOMOTIL ) 2.5-0.025 MG tablet Take 1 tablet by mouth 4 (four) times daily as needed for diarrhea or loose stools (IBS). 07/24/22   Armbruster, Lendon Queen, MD  fenofibrate 160 MG tablet Take 160 mg by mouth at bedtime.    [provider]  HYDROcodone-acetaminophen  (NORCO) 7.5-325 MG tablet Take 1 tablet by mouth every 4 (four) hours as needed.    [provider]  icosapent Ethyl (VASCEPA) 1 g capsule Take 2 g by mouth 2 (two) times daily.    [provider]  lamoTRIgine (LAMICTAL) 200 MG tablet Take 400 mg by mouth at bedtime. 05/17/21   [provider]  levonorgestrel-ethinyl estradiol (SEASONALE,INTROVALE,JOLESSA) 0.15-0.03 MG tablet Take 1 tablet by mouth in the morning.    [provider]   levothyroxine  (SYNTHROID ) 125 MCG tablet Take 125 mcg by mouth daily before breakfast. 12/07/14   [provider]  lithium  carbonate (ESKALITH) 450 MG ER tablet Take 675 mg by mouth at bedtime. 06/08/22   [provider]  lumateperone tosylate (CAPLYTA) 42 MG capsule Take 42 mg by mouth at bedtime. Patient not taking: Reported on 12/27/2022 01/02/21   [provider]  metoprolol succinate (TOPROL-XL) 100 MG 24 hr tablet Take 100 mg by mouth at bedtime.    [provider]  nitrofurantoin (MACRODANTIN) 50 MG capsule Take 50 mg by mouth at bedtime. 01/03/21   [provider]  nystatin cream (MYCOSTATIN) Apply 1 Application topically daily as needed (vaginal itching). 07/03/22   [provider]  omeprazole  (PRILOSEC) 40 MG capsule Take 1 capsule (40 mg total) by mouth daily. 04/18/23   Armbruster, Lendon Queen, MD  ondansetron  (ZOFRAN ) 4 MG tablet Take 2-4 mg by mouth every 8 (eight) hours as needed for vomiting or nausea. 01/30/21   [provider]  oxybutynin (DITROPAN) 5 MG/5ML syrup 5 mg 2 (two) times daily as needed. Per [place]  pouch    [provider]  OZEMPIC, 0.25 OR 0.5 MG/DOSE, 2 MG/3ML SOPN  10/01/22   [provider]  ramelteon (ROZEREM) 8 MG tablet Take 1 tablet by mouth at bedtime.    [provider]  rOPINIRole (REQUIP) 2 MG tablet Take 2 mg by mouth at bedtime. 06/19/22  [provider]  rosuvastatin (CRESTOR) 5 MG tablet Take 1 tablet by mouth at bedtime.    [provider]  Safety Seal Miscellaneous MISC Apply 1 Application topically in the morning. Medication Name: Hormonic Hair Solution (Minoxidil 8%, Finasteride 0.05%) 12/27/22   Dellar Fenton, DO  sucralfate  (CARAFATE ) 1 g tablet TAKE 1 TABLET BY MOUTH EVERY 6 HOURS AS NEEDED. 04/16/23   Armbruster, Lendon Queen, MD  telmisartan (MICARDIS) 40 MG tablet Take 40 mg by mouth at bedtime. 02/01/21   [provider]  tiZANidine  (ZANAFLEX) 4 MG tablet Take 8 mg by mouth 2 (two) times daily. 11/22/20   [provider]  triamcinolone cream (KENALOG) 0.1 % Apply 1 Application topically daily as needed (itching). 07/03/22   [provider]      Allergies    Keflex [cephalexin], Penicillins, and Sulfa antibiotics    Review of Systems   Review of Systems  Physical Exam Updated Vital Signs BP 119/67   Pulse (!) 111   Temp 98.4 F (36.9 C)   Resp 16   Ht 5\' 6"  (1.676 m)   Wt 99.8 kg   SpO2 100%   BMI 35.51 kg/m  Physical Exam  ED Results / Procedures / Treatments   Labs (all labs ordered are listed, but only abnormal results are displayed) Labs Reviewed  CBC WITH DIFFERENTIAL/PLATELET - Abnormal; Notable for the following components:      Result Value   RBC 5.91 (*)    Hemoglobin 16.9 (*)    HCT 55.2 (*)    Platelets 569 (*)    Monocytes Absolute 1.2 (*)    All other components within normal limits  COMPREHENSIVE METABOLIC PANEL WITH GFR - Abnormal; Notable for the following components:   Sodium 133 (*)    CO2 15 (*)    Glucose, Bld 168 (*)    BUN 23 (*)    Creatinine, Ser 1.91 (*)    Calcium 10.8 (*)    Total Protein 8.9 (*)    Alkaline Phosphatase 37 (*)    GFR, Estimated 33 (*)    Anion gap 17 (*)    All other components within normal limits  I-STAT CHEM 8, ED - Abnormal; Notable for the following components:   Sodium 134 (*)    BUN 28 (*)    Creatinine, Ser 1.80 (*)    Glucose, Bld 172 (*)    TCO2 19 (*)    Hemoglobin 18.7 (*)    HCT 55.0 (*)    All other components within normal limits  LIPASE, BLOOD  MAGNESIUM   URINALYSIS, ROUTINE W REFLEX MICROSCOPIC  LACTIC ACID, PLASMA  LACTIC ACID, PLASMA  HIV ANTIBODY (ROUTINE TESTING W REFLEX)  CBC  BASIC METABOLIC PANEL WITH GFR  MAGNESIUM   PHOSPHORUS  TYPE AND SCREEN  ABO/RH  TROPONIN I (HIGH SENSITIVITY)    EKG None  Radiology CT ABDOMEN PELVIS W CONTRAST Result Date: 07/22/2023 CLINICAL DATA:  Acute  abdominal pain. EXAM: CT ABDOMEN AND PELVIS WITH CONTRAST TECHNIQUE: Multidetector CT imaging of the abdomen and pelvis was performed using the standard protocol following bolus administration of intravenous contrast. RADIATION DOSE REDUCTION: This exam was performed according to the departmental dose-optimization program which includes automated exposure control, adjustment of the mA and/or kV according to patient size and/or use of iterative reconstruction technique. CONTRAST:  75mL ISOVUE -370 IOPAMIDOL  (ISOVUE -370) INJECTION 76% COMPARISON:  MRI abdomen 05/08/2023. CT abdomen and pelvis 04/23/2023. FINDINGS: Lower chest: No acute abnormality. Hepatobiliary: No focal liver  abnormality is seen. Status post cholecystectomy. No biliary dilatation. Pancreas: Unremarkable. No pancreatic ductal dilatation or surrounding inflammatory changes. Spleen: Normal in size without focal abnormality. Adrenals/Urinary Tract: Bilateral adrenal glands are within normal limits. Horseshoe kidney again noted. There is no hydronephrosis. The bladder surgically absent. Diverting ileostomy appears unchanged and uncomplicated. Stomach/Bowel: There are dilated proximal small bowel loops measuring up to 4.2 cm. Transition 0.8 not definitely seen, but likely somewhere in the mid abdomen. Distal small bowel and colon are nondilated. The appendix is not seen. There is no pneumatosis, free air or wall thickening. The stomach is within normal limits. Vascular/Lymphatic: No significant vascular findings are present. No enlarged abdominal or pelvic lymph nodes. Reproductive: Status post hysterectomy. No adnexal masses. Other: No abdominal wall hernia or abnormality. No abdominopelvic ascites. Musculoskeletal: No fracture is seen. IMPRESSION: 1. Findings compatible with small-bowel obstruction. Transition point not definitely seen, but likely in the mid abdomen. 2. Horseshoe kidney. 3. Diverting ileostomy appears unchanged and uncomplicated.  Electronically Signed   By: Tyron Gallon M.D.   On: 07/22/2023 18:19    Procedures Procedures    Medications Ordered in ED Medications  fentaNYL  (SUBLIMAZE ) injection 50 mcg (has no administration in time range)  0.9 %  sodium chloride  infusion (has no administration in time range)  prochlorperazine  (COMPAZINE ) injection 5 mg (has no administration in time range)  pantoprazole  (PROTONIX ) injection 80 mg (80 mg Intravenous Given 07/22/23 1617)  fentaNYL  (SUBLIMAZE ) injection 50 mcg (50 mcg Intravenous Given 07/22/23 1627)  ondansetron  (ZOFRAN ) injection 4 mg (4 mg Intravenous Given 07/22/23 1618)  sodium chloride  0.9 % bolus 1,000 mL (0 mLs Intravenous Stopped 07/22/23 1944)  iopamidol  (ISOVUE -370) 76 % injection 75 mL (75 mLs Intravenous Contrast Given 07/22/23 1707)    ED Course/ Medical Decision Making/ A&P                                 Medical Decision Making Amount and/or Complexity of Data Reviewed Radiology: ordered.  Risk Prescription drug management. Decision regarding hospitalization.   Patient is a 43 year old who presents with abdominal pain associate with nausea and vomiting.  She is also had some loose stools.  Labs show a normal hemoglobin.  She does have an AKI.  Her creatinine is elevated as compared to prior values.  CT scan shows evidence of a small bowel obstruction.  She has not had any ongoing hematemesis.  She was given Protonix , IV fluids and pain medication.  NG tube was placed.  I discussed with Dr. Dorrie Gaudier with general surgery who will see the patient.  Also discussed the patient with Dr. Del Favia with the medicine service who will admit the patient for further treatment.   Final Clinical Impression(s) / ED Diagnoses Final diagnoses:  Hematemesis with nausea  SBO (small bowel obstruction) (HCC)    Rx / DC Orders ED Discharge Orders     None         Hershel Los, MD 07/22/23 2002

## 2023-07-22 NOTE — Telephone Encounter (Signed)
 Patient called and stated that she was Vomiting blood since yesterday and is very weak and sore. Patient stated that she can not sit and wait in the emergency room for more then 20 minutes. Patient is wanting to know if she can be seen by Dr. General Kenner today if any all possible. Patient is requesting a call back. Please advise.

## 2023-07-22 NOTE — ED Provider Triage Note (Signed)
 Emergency Medicine Provider Triage Evaluation Note  [name] , a 43 y.o. female  was evaluated in triage.  Pt complains of middle abdominal pain with nausea, vomiting, diarrhea.  For she had at least 6 episodes of emesis.  Reports she has had 3 of these with dark red blood clots present but no frank red blood.  Denies any fevers.  Reports diarrhea has been food colored or brown but not black or bloody.  She does have nausea.  Has a history of GAVE which pressure prone for bleeding.  Denies any urinary symptoms.  Denies any chest pain or shortness of breath.  Has tried Zofran  without much relief.  Review of Systems  Positive:  Negative:   Physical Exam  BP 115/84   Pulse (!) 127   Temp 98.4 F (36.9 C)   Resp 18   Ht 5\' 6"  (1.676 m)   Wt 99.8 kg   SpO2 100%   BMI 35.51 kg/m  Gen:   Awake, patient extremely diaphoretic and clammy/cool Resp:  Normal effort  MSK:   Moves extremities without difficulty  Other:  Diffuse abdominal tenderness palpation but mainly in the middle to epigastric region. DP and PT pulse 2+ bilateral  Medical Decision Making  Medically screening exam initiated at 3:28 PM.  Appropriate orders placed.  [name] was informed that the remainder of the evaluation will be completed by another provider, this initial triage assessment does not replace that evaluation, and the importance of remaining in the ED until their evaluation is complete.  Patient is tachycardia almost 130s, is cool and diaphoretic.  Is ill-appearing.  Charge nurse was alerted and patient will be moved to room 33.  I have let the physician and provider in the blue zone know that labs have been ordered however imaging has not and will be needed.    Spence Dux, [place] 07/22/23 1530

## 2023-07-22 NOTE — Telephone Encounter (Signed)
 Discussed with pt that if she is vomiting blood she needs to be seen in the [facility]. Explained we do not have any appts and could not do anything at an office visit for vomiting blood, that she may need a procedure. Pt verbalized understanding and states she will try to go to the ER.

## 2023-07-22 NOTE — Consult Note (Signed)
 Reason for Consult:vomiting Referring Provider: Hershel Los  [name] is an 43 y.o. female.  HPI: 43 yo female with 2 days of abdominal pain. Pain is diffuse across the abdomen. She was also vomiting multiple times today. It was bloody once but otherwise green/nonbloody. She has never had a bowel obstruction before. She had an [place]  pouch created for Interstitial cystitis in 2011. She had a perforation of the pouch in 2017 and underwent an additional bowel resection at that time.  Past Medical History:  Diagnosis Date   ADHD (attention deficit hyperactivity disorder)    Allergy    Anemia    Anxiety    Arthritis, lumbar spine    Bipolar disorder (HCC)    Chronic back pain    Chronic headaches    Chronic interstitial cystitis    "removed my bladder when I went to end stage IC"   Depression    Disc degeneration, lumbar 01/10/2022   Fibromyalgia    GERD (gastroesophageal reflux disease)    Gestational diabetes    resolved   History of blood transfusion 02/2010   "related to OR"   History of kidney stones    HLD (hyperlipidemia)    Hypothyroid    IBS (irritable colon syndrome)    IDA (iron  deficiency anemia)    Interstitial cystitis 06/2008   Dr. Royann Cords [facility]   Polycystic ovarian disease    Preeclampsia 2009   Self-catheterizes urinary bladder    "I have an [place]  pouch; made a bladder using part of my small intestines; cath out of stoma in my belly button" (08/02/2015)   Small bowel obstruction (HCC) 08/03/2015   Spina bifida (HCC) 01/10/2022   Thyroid  disease    Vitamin D deficiency     Past Surgical History:  Procedure Laterality Date   ABDOMINAL HYSTERECTOMY  02/2010   "left my ovaries"   APPENDECTOMY  02/2010   BLADDER REMOVAL  02/2010   and [place]  Pouch   CESAREAN SECTION  2009   CHOLECYSTECTOMY N/A 05/27/2017   Procedure: LAPAROSCOPIC CHOLECYSTECTOMY;  Surgeon: Shela Derby, MD;  Location: [facility] OR;  Service: General;  Laterality: N/A;    COLONOSCOPY     ESOPHAGOGASTRODUODENOSCOPY (EGD) WITH PROPOFOL  N/A 07/25/2022   Procedure: ESOPHAGOGASTRODUODENOSCOPY (EGD) WITH PROPOFOL ;  Surgeon: Ace Holder, MD;  Location: [facility] ENDOSCOPY;  Service: Gastroenterology;  Laterality: N/A;   HOT HEMOSTASIS N/A 07/25/2022   Procedure: HOT HEMOSTASIS (ARGON PLASMA COAGULATION/BICAP);  Surgeon: Ace Holder, MD;  Location: [facility] ENDOSCOPY;  Service: Gastroenterology;  Laterality: N/A;   indiana  pouch  01/19/2021   Cystoscopy with bulk amid injection   LAPAROSCOPIC LYSIS OF ADHESIONS N/A 05/27/2017   Procedure: LAPAROSCOPIC LYSIS OF ADHESIONS;  Surgeon: Shela Derby, MD;  Location: [facility] OR;  Service: General;  Laterality: N/A;   OTHER SURGICAL HISTORY     remval of urethra when bladder was removed   SMALL INTESTINE SURGERY  2017   for bowel obstruction   SPINAL CORD STIMULATOR IMPLANT  09/2009; ~ 04/2015   SPINAL CORD STIMULATOR REMOVAL  ~ 04/2015   UPPER GI ENDOSCOPY     WISDOM TOOTH EXTRACTION      Family History  Problem Relation Age of Onset   Lung cancer Mother        smoker   Other Father        benigh tumor between heart and lungs, had surgery and something went wrong, he died at 62   Diabetes Paternal Grandmother    Heart disease  Paternal Grandmother    Diabetes Paternal Grandfather    Heart disease Paternal Grandfather    Pancreatic cancer Paternal Grandfather    Liver cancer Paternal Grandfather    Breast cancer Maternal Aunt    Colon cancer Neg Hx    Esophageal cancer Neg Hx    Prostate cancer Neg Hx    Rectal cancer Neg Hx    Stomach cancer Neg Hx     Social History:  reports that she has never smoked. She has never used smokeless tobacco. She reports current alcohol  use. She reports that she does not use drugs.  Allergies:  Allergies  Allergen Reactions   Keflex [Cephalexin] Hives    Can take with antihistamine   Penicillins Hives and Other (See Comments)    PATIENT HAS HAD A PCN REACTION WITH IMMEDIATE  RASH, FACIAL/TONGUE/THROAT SWELLING, SOB, OR LIGHTHEADEDNESS WITH HYPOTENSION:  #  #  #  YES  #  #  #   Has patient had a PCN reaction causing severe rash involving mucus membranes or skin necrosis: No Has patient had a PCN reaction that required hospitalization No Has patient had a PCN reaction occurring within the last 10 years: No If all of the above answers are "NO", then may proceed with Cephalosporin use.    Sulfa Antibiotics Hives    Medications: I have reviewed the patient's current medications.  Results for orders placed or performed during the hospital encounter of 07/22/23 (from the past 48 hours)  CBC with Differential     Status: Abnormal   Collection Time: 07/22/23  3:34 PM  Result Value Ref Range   WBC 9.0 4.0 - 10.5 K/uL   RBC 5.91 (H) 3.87 - 5.11 MIL/uL   Hemoglobin 16.9 (H) 12.0 - 15.0 g/dL   HCT 82.9 (H) 56.2 - 13.0 %   MCV 93.4 80.0 - 100.0 fL   MCH 28.6 26.0 - 34.0 pg   MCHC 30.6 30.0 - 36.0 g/dL   RDW 86.5 78.4 - 69.6 %   Platelets 569 (H) 150 - 400 K/uL   nRBC 0.0 0.0 - 0.2 %   Neutrophils Relative % 70 %   Neutro Abs 6.3 1.7 - 7.7 K/uL   Lymphocytes Relative 13 %   Lymphs Abs 1.2 0.7 - 4.0 K/uL   Monocytes Relative 14 %   Monocytes Absolute 1.2 (H) 0.1 - 1.0 K/uL   Eosinophils Relative 1 %   Eosinophils Absolute 0.1 0.0 - 0.5 K/uL   Basophils Relative 1 %   Basophils Absolute 0.1 0.0 - 0.1 K/uL   Immature Granulocytes 1 %   Abs Immature Granulocytes 0.06 0.00 - 0.07 K/uL    Comment: Performed at [facility], [address] [place], [place] [address]  Comprehensive metabolic panel     Status: Abnormal   Collection Time: 07/22/23  3:34 PM  Result Value Ref Range   Sodium 133 (L) 135 - 145 mmol/L   Potassium 4.7 3.5 - 5.1 mmol/L   Chloride 101 98 - 111 mmol/L   CO2 15 (L) 22 - 32 mmol/L   Glucose, Bld 168 (H) 70 - 99 mg/dL    Comment: Glucose reference range applies only to samples taken after fasting for at least 8 hours.   BUN 23 (H) 6 -  20 mg/dL   Creatinine, Ser 4.13 (H) 0.44 - 1.00 mg/dL   Calcium 24.4 (H) 8.9 - 10.3 mg/dL   Total Protein 8.9 (H) 6.5 - 8.1 g/dL   Albumin 5.0  3.5 - 5.0 g/dL   AST 31 15 - 41 U/L   ALT 23 0 - 44 U/L   Alkaline Phosphatase 37 (L) 38 - 126 U/L   Total Bilirubin 1.2 0.0 - 1.2 mg/dL   GFR, Estimated 33 (L) >60 mL/min    Comment: (NOTE) Calculated using the CKD-EPI Creatinine Equation (2021)    Anion gap 17 (H) 5 - 15    Comment: Performed at [facility], [address] [place], [place] [address]  Lipase, blood     Status: None   Collection Time: 07/22/23  3:34 PM  Result Value Ref Range   Lipase 30 11 - 51 U/L    Comment: Performed at [facility], [address], [place], [place] [address]  Type and screen [facility]     Status: None   Collection Time: 07/22/23  3:34 PM  Result Value Ref Range   ABO/RH(D) O POS    Antibody Screen NEG    Sample Expiration      07/25/2023,2359 Performed at [facility], [address], [place], [place] [address]   Magnesium      Status: None   Collection Time: 07/22/23  3:34 PM  Result Value Ref Range   Magnesium  2.0 1.7 - 2.4 mg/dL    Comment: Performed at [facility], [address] [place], [place] [address]  Troponin I (High Sensitivity)     Status: None   Collection Time: 07/22/23  3:34 PM  Result Value Ref Range   Troponin I (High Sensitivity) 7 <18 ng/L    Comment: (NOTE) Elevated high sensitivity troponin I (hsTnI) values and significant  changes across serial measurements may suggest ACS but many other  chronic and acute conditions are known to elevate hsTnI results.  Refer to the "Links" section for chest pain algorithms and additional  guidance. Performed at [facility], [address], [place], [place] [address]   I-stat chem 8, ED (not at [facility], [facility] or [facility])     Status: Abnormal   Collection Time: 07/22/23  4:09 PM  Result Value Ref Range   Sodium 134 (L) 135 - 145 mmol/L    Potassium 4.6 3.5 - 5.1 mmol/L   Chloride 106 98 - 111 mmol/L   BUN 28 (H) 6 - 20 mg/dL   Creatinine, Ser 1.30 (H) 0.44 - 1.00 mg/dL   Glucose, Bld 865 (H) 70 - 99 mg/dL    Comment: Glucose reference range applies only to samples taken after fasting for at least 8 hours.   Calcium, Ion 1.26 1.15 - 1.40 mmol/L   TCO2 19 (L) 22 - 32 mmol/L   Hemoglobin 18.7 (H) 12.0 - 15.0 g/dL   HCT 78.4 (H) 69.6 - 29.5 %  ABO/Rh     Status: None   Collection Time: 07/22/23  7:38 PM  Result Value Ref Range   ABO/RH(D)      O POS Performed at [facility], [address] [place], [place] [address]   Lactic acid, plasma     Status: None   Collection Time: 07/22/23  7:39 PM  Result Value Ref Range   Lactic Acid, Venous 1.7 0.5 - 1.9 mmol/L    Comment: Performed at [facility], [address], [place], [place] [address]  I-Stat CG4 Lactic Acid, ED     Status: None   Collection Time: 07/22/23  7:48 PM  Result Value Ref Range  Lactic Acid, Venous 1.9 0.5 - 1.9 mmol/L    PE Blood pressure (!) 141/93, pulse (!) 115, temperature 99.1 F (37.3 C), temperature source Oral, resp. rate 16, height 5\' 6"  (1.676 m), weight 99.8 kg, SpO2 100%. Constitutional: NAD; conversant; no deformities Eyes: Moist conjunctiva; no lid lag; anicteric; PERRL Neck: Trachea midline; no thyromegaly Lungs: Normal respiratory effort; no tactile fremitus CV: RRR; no palpable thrills; no pitting edema GI: Abd soft, tender to palpation left side of abdomen, periumbilical scar with small opening; no palpable hepatosplenomegaly MSK: Normal gait; no clubbing/cyanosis Psychiatric: Appropriate affect; alert and oriented x3 Lymphatic: No palpable cervical or axillary lymphadenopathy Skin: No major subcutaneous nodules. Warm and dry   Assessment/Plan: 43 yo female with history of ind[place] pouch here with bowel obstruction. I reviewed CT images showing fecalization of the intestine proximal to the anastomosis with dilation  back to the stomach. I discussed plan with the patient and spouse for nonoperative management with contrast administration with serial abdominal exams and XRs and possibility of surgery if nonresolution. She showed good understanding. _NG tube insertion -SBO protocol -admit to medicine  I reviewed last 24 h vitals and pain scores, last 48 h intake and output, last 24 h labs and trends, and last 24 h imaging results.  This care required high  level of medical decision making.   Alphonso Aschoff [name] 07/22/2023, 8:45 PM

## 2023-07-22 NOTE — ED Notes (Signed)
 The patient is requesting pain medication.  The RN has been made aware of patient's needs via secure chat as the RN is in a trauma.

## 2023-07-22 NOTE — ED Triage Notes (Signed)
 Pt began having NVD yesterday. 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 Pt has been having emesis with dark red clots in it. Emesis x 6 today. Pt has not had PO intake since yesterday. No blood in stool. Feeling fatigue and having mid line abd  pain

## 2023-07-22 NOTE — H&P (Addendum)
 History and Physical  [name] YNW:[id number] DOB: 1980-08-13 DOA: 07/22/2023  Referring physician: Dr. Nolia Baumgartner, EDP. PCP: Avva, Ravisankar, MD  Outpatient Specialists: [facility], OB/GYN, hematology. Patient coming from: Home.  Chief Complaint: Nausea vomiting abdominal pain since yesterday.  HPI: [name] is a 43 y.o. female with medical history significant for gastric antral vascular ectasia, history of cystectomy status post urinary diversion procedure, history of iron  deficiency (on iron  infusion regularly), hyperlipidemia, obesity, GERD, fibromyalgia, hypothyroidism, history of liver lesions (MRI 05/08/2023) follows with [facility] Dr. General Kenner, history of right ovarian cyst (MRI 05/08/2023), follows with OB/GYN, who presents to the ER from home with complaints of intractable nausea, vomiting, and abdominal pain since yesterday.  Associated with intermittent, 3 episodes of mild hematemesis (x2) yesterday and (x1) today with in between nonbloody emesis.  No reported subjective fevers or chills.  In the ER, tachycardic and tachypneic.  CT abdomen pelvis with contrast revealed findings compatible with small bowel obstruction.  Transition point not definitively seen but likely in the mid abdomen.  Horseshoe kidney.  Diverting ileostomy appears unchanged and uncomplicated.  NG tube ordered in the ER to be placed and SBO protocol implemented.  EDP discussed the case with general surgery, Dr. Gillian Lacrosse, who will see in consultation.  Admitted by [facility], hospitalist service.  ED Course: Temperature 98.4.  BP 119/67, pulse 115, respiration rate 29, O2 saturation 100% on room air.  Lab studies notable for serum sodium 133, bicarb 15, glucose 168, creatinine 1.91 with GFR of 33.  WBC 9.0, hemoglobin 16.9, platelet count 569.  Review of Systems: Review of systems as noted in the HPI. All other systems reviewed and are negative.   Past Medical History:  Diagnosis Date   ADHD (attention  deficit hyperactivity disorder)    Allergy    Anemia    Anxiety    Arthritis, lumbar spine    Bipolar disorder (HCC)    Chronic back pain    Chronic headaches    Chronic interstitial cystitis    "removed my bladder when I went to end stage IC"   Depression    Disc degeneration, lumbar 01/10/2022   Fibromyalgia    GERD (gastroesophageal reflux disease)    Gestational diabetes    resolved   History of blood transfusion 02/2010   "related to OR"   History of kidney stones    HLD (hyperlipidemia)    Hypothyroid    IBS (irritable colon syndrome)    IDA (iron  deficiency anemia)    Interstitial cystitis 06/2008   Dr. Royann Cords [facility]   Polycystic ovarian disease    Preeclampsia 2009   Self-catheterizes urinary bladder    "I have an [place]  pouch; made a bladder using part of my small intestines; cath out of stoma in my belly button" (08/02/2015)   Small bowel obstruction (HCC) 08/03/2015   Spina bifida (HCC) 01/10/2022   Thyroid  disease    Vitamin D deficiency    Past Surgical History:  Procedure Laterality Date   ABDOMINAL HYSTERECTOMY  02/2010   "left my ovaries"   APPENDECTOMY  02/2010   BLADDER REMOVAL  02/2010   and [place]  Pouch   CESAREAN SECTION  2009   CHOLECYSTECTOMY N/A 05/27/2017   Procedure: LAPAROSCOPIC CHOLECYSTECTOMY;  Surgeon: Shela Derby, MD;  Location: [facility] OR;  Service: General;  Laterality: N/A;   COLONOSCOPY     ESOPHAGOGASTRODUODENOSCOPY (EGD) WITH PROPOFOL  N/A 07/25/2022   Procedure: ESOPHAGOGASTRODUODENOSCOPY (EGD) WITH PROPOFOL ;  Surgeon: Ace Holder, MD;  Location: [facility] ENDOSCOPY;  Service: Gastroenterology;  Laterality: N/A;   HOT HEMOSTASIS N/A 07/25/2022   Procedure: HOT HEMOSTASIS (ARGON PLASMA COAGULATION/BICAP);  Surgeon: Ace Holder, MD;  Location: [facility] ENDOSCOPY;  Service: Gastroenterology;  Laterality: N/A;   [place]  pouch  01/19/2021   Cystoscopy with bulk amid injection   LAPAROSCOPIC LYSIS OF ADHESIONS N/A 05/27/2017    Procedure: LAPAROSCOPIC LYSIS OF ADHESIONS;  Surgeon: Shela Derby, MD;  Location: [facility] OR;  Service: General;  Laterality: N/A;   OTHER SURGICAL HISTORY     remval of urethra when bladder was removed   SMALL INTESTINE SURGERY  2017   for bowel obstruction   SPINAL CORD STIMULATOR IMPLANT  09/2009; ~ 04/2015   SPINAL CORD STIMULATOR REMOVAL  ~ 04/2015   [facility]     WISDOM TOOTH EXTRACTION      Social History:  reports that she has never smoked. She has never used smokeless tobacco. She reports current alcohol  use. She reports that she does not use drugs.   Allergies  Allergen Reactions   Keflex [Cephalexin] Hives    Can take with antihistamine   Penicillins Hives and Other (See Comments)    PATIENT HAS HAD A PCN REACTION WITH IMMEDIATE RASH, FACIAL/TONGUE/THROAT SWELLING, SOB, OR LIGHTHEADEDNESS WITH HYPOTENSION:  #  #  #  YES  #  #  #   Has patient had a PCN reaction causing severe rash involving mucus membranes or skin necrosis: No Has patient had a PCN reaction that required hospitalization No Has patient had a PCN reaction occurring within the last 10 years: No If all of the above answers are "NO", then may proceed with Cephalosporin use.    Sulfa Antibiotics Hives    Family History  Problem Relation Age of Onset   Lung cancer Mother        smoker   Other Father        benigh tumor between heart and lungs, had surgery and something went wrong, he died at 65   Diabetes Paternal Grandmother    Heart disease Paternal Grandmother    Diabetes Paternal Grandfather    Heart disease Paternal Grandfather    Pancreatic cancer Paternal Grandfather    Liver cancer Paternal Grandfather    Breast cancer Maternal Aunt    Colon cancer Neg Hx    Esophageal cancer Neg Hx    Prostate cancer Neg Hx    Rectal cancer Neg Hx    Stomach cancer Neg Hx       Prior to Admission medications   Medication Sig Start Date End Date Taking? Authorizing Provider  acetaminophen  (TYLENOL )  650 MG CR tablet Take 1,300 mg by mouth 2 (two) times daily.    [provider]  amantadine (SYMMETREL) 100 MG capsule Take 100 mg by mouth 2 (two) times daily. 02/06/21   [provider]  amphetamine -dextroamphetamine  (ADDERALL) 30 MG tablet Take 30 mg by mouth 2 (two) times daily.    [provider]  AUSTEDO XR 36 MG TB24  02/07/23   [provider]  clonazePAM  (KLONOPIN ) 2 MG tablet Take 1 mg by mouth 3 (three) times daily as needed for anxiety. 08/03/20   [provider]  D3-50 1.25 MG (50000 UT) capsule Take 50,000 Units by mouth 3 (three) times a week. 12/12/20   [provider]  dicyclomine  (BENTYL ) 10 MG capsule TAKE 1 CAPSULE BY MOUTH TWICE A DAY 05/03/23   Armbruster, Lendon Queen, MD  diphenoxylate -atropine  (LOMOTIL ) 2.5-0.025 MG  tablet Take 1 tablet by mouth 4 (four) times daily as needed for diarrhea or loose stools (IBS). 07/24/22   Armbruster, Lendon Queen, MD  fenofibrate 160 MG tablet Take 160 mg by mouth at bedtime.    [provider]  HYDROcodone-acetaminophen  (NORCO) 7.5-325 MG tablet Take 1 tablet by mouth every 4 (four) hours as needed.    [provider]  icosapent Ethyl (VASCEPA) 1 g capsule Take 2 g by mouth 2 (two) times daily.    [provider]  lamoTRIgine (LAMICTAL) 200 MG tablet Take 400 mg by mouth at bedtime. 05/17/21   [provider]  levonorgestrel-ethinyl estradiol (SEASONALE,INTROVALE,JOLESSA) 0.15-0.03 MG tablet Take 1 tablet by mouth in the morning.    [provider]  levothyroxine  (SYNTHROID ) 125 MCG tablet Take 125 mcg by mouth daily before breakfast. 12/07/14   [provider]  lithium  carbonate (ESKALITH) 450 MG ER tablet Take 675 mg by mouth at bedtime. 06/08/22   [provider]  lumateperone tosylate (CAPLYTA) 42 MG capsule Take 42 mg by mouth at bedtime. Patient not taking: Reported on 12/27/2022 01/02/21   [provider]  metoprolol succinate  (TOPROL-XL) 100 MG 24 hr tablet Take 100 mg by mouth at bedtime.    [provider]  nitrofurantoin (MACRODANTIN) 50 MG capsule Take 50 mg by mouth at bedtime. 01/03/21   [provider]  nystatin cream (MYCOSTATIN) Apply 1 Application topically daily as needed (vaginal itching). 07/03/22   [provider]  omeprazole  (PRILOSEC) 40 MG capsule Take 1 capsule (40 mg total) by mouth daily. 04/18/23   Armbruster, Lendon Queen, MD  ondansetron  (ZOFRAN ) 4 MG tablet Take 2-4 mg by mouth every 8 (eight) hours as needed for vomiting or nausea. 01/30/21   [provider]  oxybutynin (DITROPAN) 5 MG/5ML syrup 5 mg 2 (two) times daily as needed. Per [place]  pouch    [provider]  OZEMPIC, 0.25 OR 0.5 MG/DOSE, 2 MG/3ML SOPN  10/01/22   [provider]  ramelteon (ROZEREM) 8 MG tablet Take 1 tablet by mouth at bedtime.    [provider]  rOPINIRole (REQUIP) 2 MG tablet Take 2 mg by mouth at bedtime. 06/19/22   [provider]  rosuvastatin (CRESTOR) 5 MG tablet Take 1 tablet by mouth at bedtime.    [provider]  Safety Seal Miscellaneous MISC Apply 1 Application topically in the morning. Medication Name: Hormonic Hair Solution (Minoxidil 8%, Finasteride 0.05%) 12/27/22   Dellar Fenton, DO  sucralfate  (CARAFATE ) 1 g tablet TAKE 1 TABLET BY MOUTH EVERY 6 HOURS AS NEEDED. 04/16/23   Armbruster, Lendon Queen, MD  telmisartan (MICARDIS) 40 MG tablet Take 40 mg by mouth at bedtime. 02/01/21   [provider]  tiZANidine (ZANAFLEX) 4 MG tablet Take 8 mg by mouth 2 (two) times daily. 11/22/20   [provider]  triamcinolone cream (KENALOG) 0.1 % Apply 1 Application topically daily as needed (itching). 07/03/22   [provider]    Physical Exam: BP 119/67   Pulse (!) 111   Temp 98.4 F (36.9 C)   Resp 16   Ht 5\' 6"  (1.676 m)   Wt 99.8 kg   SpO2 100%   BMI 35.51 kg/m   General: 43 y.o. year-old female well  developed well nourished in no acute distress.  Alert and oriented x3. Cardiovascular: Regular rate and rhythm with no rubs or gallops.  No thyromegaly or JVD noted.  No lower extremity edema. 2/4 pulses  in all 4 extremities. Respiratory: Clear to auscultation with no wheezes or rales. Good inspiratory effort. Abdomen: Soft diffuse abdominal tenderness with hypoactive bowel sounds. Muskuloskeletal: No cyanosis, clubbing or edema noted bilaterally Neuro: CN II-XII intact, strength, sensation, reflexes Skin: No ulcerative lesions noted or rashes Psychiatry: Judgement and insight appear normal. Mood is appropriate for condition and setting          Labs on Admission:  Basic Metabolic Panel: Recent Labs  Lab 07/22/23 1534 07/22/23 1609  NA 133* 134*  K 4.7 4.6  CL 101 106  CO2 15*  --   GLUCOSE 168* 172*  BUN 23* 28*  CREATININE 1.91* 1.80*  CALCIUM 10.8*  --   MG 2.0  --    Liver Function Tests: Recent Labs  Lab 07/22/23 1534  AST 31  ALT 23  ALKPHOS 37*  BILITOT 1.2  PROT 8.9*  ALBUMIN 5.0   Recent Labs  Lab 07/22/23 1534  LIPASE 30   No results for input(s): "AMMONIA" in the last 168 hours. CBC: Recent Labs  Lab 07/22/23 1534 07/22/23 1609  WBC 9.0  --   NEUTROABS 6.3  --   HGB 16.9* 18.7*  HCT 55.2* 55.0*  MCV 93.4  --   PLT 569*  --    Cardiac Enzymes: No results for input(s): "CKTOTAL", "CKMB", "CKMBINDEX", "TROPONINI" in the last 168 hours.  BNP (last 3 results) No results for input(s): "BNP" in the last 8760 hours.  ProBNP (last 3 results) No results for input(s): "PROBNP" in the last 8760 hours.  CBG: No results for input(s): "GLUCAP" in the last 168 hours.  Radiological Exams on Admission: CT ABDOMEN PELVIS W CONTRAST Result Date: 07/22/2023 CLINICAL DATA:  Acute abdominal pain. EXAM: CT ABDOMEN AND PELVIS WITH CONTRAST TECHNIQUE: Multidetector CT imaging of the abdomen and pelvis was performed using the standard protocol following bolus  administration of intravenous contrast. RADIATION DOSE REDUCTION: This exam was performed according to the departmental dose-optimization program which includes automated exposure control, adjustment of the mA and/or kV according to patient size and/or use of iterative reconstruction technique. CONTRAST:  75mL ISOVUE -370 IOPAMIDOL  (ISOVUE -370) INJECTION 76% COMPARISON:  MRI abdomen 05/08/2023. CT abdomen and pelvis 04/23/2023. FINDINGS: Lower chest: No acute abnormality. Hepatobiliary: No focal liver abnormality is seen. Status post cholecystectomy. No biliary dilatation. Pancreas: Unremarkable. No pancreatic ductal dilatation or surrounding inflammatory changes. Spleen: Normal in size without focal abnormality. Adrenals/Urinary Tract: Bilateral adrenal glands are within normal limits. Horseshoe kidney again noted. There is no hydronephrosis. The bladder surgically absent. Diverting ileostomy appears unchanged and uncomplicated. Stomach/Bowel: There are dilated proximal small bowel loops measuring up to 4.2 cm. Transition 0.8 not definitely seen, but likely somewhere in the mid abdomen. Distal small bowel and colon are nondilated. The appendix is not seen. There is no pneumatosis, free air or wall thickening. The stomach is within normal limits. Vascular/Lymphatic: No significant vascular findings are present. No enlarged abdominal or pelvic lymph nodes. Reproductive: Status post hysterectomy. No adnexal masses. Other: No abdominal wall hernia or abnormality. No abdominopelvic ascites. Musculoskeletal: No fracture is seen. IMPRESSION: 1. Findings compatible with small-bowel obstruction. Transition point not definitely seen, but likely in the mid abdomen. 2. Horseshoe kidney. 3. Diverting ileostomy appears unchanged and uncomplicated. Electronically Signed   By: Tyron Gallon M.D.   On: 07/22/2023 18:19    EKG: I independently viewed the EKG done and my findings are as followed: Sinus tachycardia rate of 121.   Nonspecific ST-T changes.  QTc 491.  Assessment/Plan Present on Admission:  SBO (small bowel obstruction) (HCC)  Principal Problem:   SBO (small bowel obstruction) (HCC)  SBO, transition point likely in the mid abdomen. History of GAVE Continue IV fluid NS at 100 cc/h x 1 day Continue IV antiemetics as needed Optimize magnesium  and potassium levels with goals magnesium  of greater than 2 and goal potassium greater than 4. Early mobilization General Surgery will see in consultation SBO protocol in place Follow morning abdominal x-ray As needed analgesics As needed Dulcolax suppository N.p.o. except for ice chips  AKI, prerenal in the setting of dehydration from intractable nausea and vomiting At baseline creatinine 1.0 GFR evidence 60 Presented with creatinine 1.91 with GFR 33 Continue IV fluid hydration Avoid nephrotoxic agents, dehydration, and hypotension Monitor urine output Repeat BMP in the morning  Mild acute blood loss anemia Reported intermittent hematemesis that has now resolved Continue to monitor for acute blood loss Hemoglobin currently stable at 16K Continue IV PPI in the setting of GAVE  Hyperglycemia Presented with glucose of 172 Obtain A1c Start insulin  sliding scale every 4 hours while n.p.o.  GAVE Hemoglobin stable despite intermittent hematemesis Continue to monitor H&H Continue IV PPI  Obesity BMI 35 Recommend weight loss outpatient with regular physical activity and healthy diet.  Hyperlipidemia Hypothyroidism Resume home oral medications when no longer n.p.o.  History of cystectomy recurrent UTIs Status post urinary diversion procedure Follow UA and urine culture, treat if indicated Resume home regimen, self cath percutaneously through her right lower abdomen   Critical care time: 55 minutes.    DVT prophylaxis: SCDs  Code Status: Full code  Family Communication: Updated the patient's husband at bedside.  Disposition Plan:  Admitted to progressive care unit.  Consults called: General Surgery.  Admission status: Inpatient status.   Status is: Inpatient The patient requires at least 2 midnights for further evaluation and treatment of present condition.   Bary Boss MD [facility] Pager [phone]  If 7PM-7AM, please contact night-coverage [email or web address] Password [email or web address]  07/22/2023, 7:53 PM

## 2023-07-23 ENCOUNTER — Inpatient Hospital Stay (HOSPITAL_COMMUNITY)

## 2023-07-23 ENCOUNTER — Encounter (HOSPITAL_COMMUNITY): Payer: Self-pay | Admitting: Internal Medicine

## 2023-07-23 DIAGNOSIS — Z906 Acquired absence of other parts of urinary tract: Secondary | ICD-10-CM

## 2023-07-23 DIAGNOSIS — E039 Hypothyroidism, unspecified: Secondary | ICD-10-CM

## 2023-07-23 DIAGNOSIS — D509 Iron deficiency anemia, unspecified: Secondary | ICD-10-CM

## 2023-07-23 DIAGNOSIS — M359 Systemic involvement of connective tissue, unspecified: Secondary | ICD-10-CM

## 2023-07-23 DIAGNOSIS — F909 Attention-deficit hyperactivity disorder, unspecified type: Secondary | ICD-10-CM | POA: Diagnosis not present

## 2023-07-23 DIAGNOSIS — M797 Fibromyalgia: Secondary | ICD-10-CM

## 2023-07-23 DIAGNOSIS — K56609 Unspecified intestinal obstruction, unspecified as to partial versus complete obstruction: Secondary | ICD-10-CM | POA: Diagnosis not present

## 2023-07-23 DIAGNOSIS — N179 Acute kidney failure, unspecified: Secondary | ICD-10-CM | POA: Insufficient documentation

## 2023-07-23 LAB — CBC
HCT: 48 % — ABNORMAL HIGH (ref 36.0–46.0)
Hemoglobin: 15.2 g/dL — ABNORMAL HIGH (ref 12.0–15.0)
MCH: 28.8 pg (ref 26.0–34.0)
MCHC: 31.7 g/dL (ref 30.0–36.0)
MCV: 90.9 fL (ref 80.0–100.0)
Platelets: 479 10*3/uL — ABNORMAL HIGH (ref 150–400)
RBC: 5.28 MIL/uL — ABNORMAL HIGH (ref 3.87–5.11)
RDW: 13.5 % (ref 11.5–15.5)
WBC: 5.6 10*3/uL (ref 4.0–10.5)
nRBC: 0 % (ref 0.0–0.2)

## 2023-07-23 LAB — BASIC METABOLIC PANEL WITH GFR
Anion gap: 16 — ABNORMAL HIGH (ref 5–15)
BUN: 27 mg/dL — ABNORMAL HIGH (ref 6–20)
CO2: 14 mmol/L — ABNORMAL LOW (ref 22–32)
Calcium: 9.6 mg/dL (ref 8.9–10.3)
Chloride: 105 mmol/L (ref 98–111)
Creatinine, Ser: 1.45 mg/dL — ABNORMAL HIGH (ref 0.44–1.00)
GFR, Estimated: 46 mL/min — ABNORMAL LOW (ref >60)
Glucose, Bld: 156 mg/dL — ABNORMAL HIGH (ref 70–99)
Potassium: 3.9 mmol/L (ref 3.5–5.1)
Sodium: 135 mmol/L (ref 135–145)

## 2023-07-23 LAB — URINALYSIS, ROUTINE W REFLEX MICROSCOPIC
Bilirubin Urine: NEGATIVE
Glucose, UA: NEGATIVE mg/dL
Hgb urine dipstick: NEGATIVE
Ketones, ur: NEGATIVE mg/dL
Nitrite: NEGATIVE
Non Squamous Epithelial: >50 — AB
Protein, ur: 30 mg/dL — AB
Specific Gravity, Urine: 1.023 (ref 1.005–1.030)
pH: 6 (ref 5.0–8.0)

## 2023-07-23 LAB — PHOSPHORUS: Phosphorus: 4.5 mg/dL (ref 2.5–4.6)

## 2023-07-23 LAB — MAGNESIUM: Magnesium: 2 mg/dL (ref 1.7–2.4)

## 2023-07-23 LAB — LACTIC ACID, PLASMA: Lactic Acid, Venous: 1.5 mmol/L (ref 0.5–1.9)

## 2023-07-23 MED ORDER — LORAZEPAM 2 MG/ML IJ SOLN: 0.5000 mg | Freq: Three times a day (TID) | INTRAMUSCULAR | Status: DC | PRN

## 2023-07-23 MED ORDER — METOPROLOL TARTRATE 5 MG/5ML IV SOLN
5.0000 mg | Freq: Three times a day (TID) | INTRAVENOUS | Status: DC
  Administered 2023-07-23 – 2023-07-24 (×4): 5 mg via INTRAVENOUS
  Filled 2023-07-23 (×4): qty 5

## 2023-07-23 MED ORDER — SODIUM CHLORIDE 0.45 % IV SOLN
INTRAVENOUS | Status: DC
  Filled 2023-07-23 (×2): qty 75

## 2023-07-23 MED ORDER — FLUCONAZOLE IN SODIUM CHLORIDE 200-0.9 MG/100ML-% IV SOLN
200.0000 mg | Freq: Once | INTRAVENOUS | Status: AC
  Administered 2023-07-23: 200 mg via INTRAVENOUS
  Filled 2023-07-23: qty 100

## 2023-07-23 MED ORDER — HYDROMORPHONE HCL 1 MG/ML IJ SOLN
0.5000 mg | INTRAMUSCULAR | Status: AC | PRN
  Administered 2023-07-23 (×4): 0.5 mg via INTRAVENOUS
  Filled 2023-07-23 (×4): qty 1

## 2023-07-23 MED ORDER — HYDROMORPHONE HCL 1 MG/ML IJ SOLN
0.5000 mg | INTRAMUSCULAR | Status: DC | PRN
  Administered 2023-07-23 – 2023-07-24 (×2): 0.5 mg via INTRAVENOUS
  Filled 2023-07-23 (×2): qty 0.5

## 2023-07-23 MED ORDER — PHENOL 1.4 % MT LIQD
1.0000 | OROMUCOSAL | Status: DC | PRN
  Administered 2023-07-23: 1 via OROMUCOSAL
  Filled 2023-07-23: qty 177

## 2023-07-23 MED ORDER — MENTHOL 3 MG MT LOZG: 1.0000 | LOZENGE | OROMUCOSAL | Status: DC | PRN

## 2023-07-23 MED ORDER — SODIUM BICARBONATE 8.4 % IV SOLN
INTRAVENOUS | Status: DC
  Filled 2023-07-23: qty 1000

## 2023-07-23 NOTE — Progress Notes (Signed)
 Subjective: CC: Reports abdominal pain and nausea have resolved after NG tube placement and pain medication.  She did have episode of diarrhea this morning.  No flatus.  Afebrile.  Tachycardic in the low 100s while was in the room.  No hypotension. WBC wnl. LA wnl on last check.   Objective: Vital signs in last 24 hours: Temp:  [98.2 F (36.8 C)-99.1 F (37.3 C)] 98.2 F (36.8 C) (05/20 0610) Pulse Rate:  [111-127] 121 (05/20 0500) Resp:  [14-24] 24 (05/20 0500) BP: (115-159)/(67-124) 150/93 (05/20 0500) SpO2:  [99 %-100 %] 100 % (05/20 0500) Weight:  [99.8 kg] 99.8 kg (05/19 1449)    Intake/Output from previous day: No intake/output data recorded. Intake/Output this shift: No intake/output data recorded.  PE: Gen:  Alert, NAD, pleasant Abd: Soft, mild distension, generalized ttp except in the RUQ without rigidity or guarding. Prior midline scar well healed. Urostomy stoma noted.   Lab Results:  Recent Labs    07/22/23 1534 07/22/23 1609 07/23/23 0553  WBC 9.0  --  5.6  HGB 16.9* 18.7* 15.2*  HCT 55.2* 55.0* 48.0*  PLT 569*  --  479*   BMET Recent Labs    07/22/23 1534 07/22/23 1609 07/23/23 0553  NA 133* 134* 135  K 4.7 4.6 3.9  CL 101 106 105  CO2 15*  --  14*  GLUCOSE 168* 172* 156*  BUN 23* 28* 27*  CREATININE 1.91* 1.80* 1.45*  CALCIUM 10.8*  --  9.6   PT/INR No results for input(s): "LABPROT", "INR" in the last 72 hours. CMP     Component Value Date/Time   NA 135 07/23/2023 0553   K 3.9 07/23/2023 0553   CL 105 07/23/2023 0553   CO2 14 (L) 07/23/2023 0553   GLUCOSE 156 (H) 07/23/2023 0553   BUN 27 (H) 07/23/2023 0553   CREATININE 1.45 (H) 07/23/2023 0553   CREATININE 0.82 07/24/2022 0954   CALCIUM 9.6 07/23/2023 0553   PROT 8.9 (H) 07/22/2023 1534   ALBUMIN 5.0 07/22/2023 1534   AST 31 07/22/2023 1534   AST 21 07/24/2022 0954   ALT 23 07/22/2023 1534   ALT 20 07/24/2022 0954   ALKPHOS 37 (L) 07/22/2023 1534   BILITOT 1.2  07/22/2023 1534   BILITOT 0.4 07/24/2022 0954   GFRNONAA 46 (L) 07/23/2023 0553   GFRNONAA >60 07/24/2022 0954   GFRAA 60 (L) 05/23/2017 1000   Lipase     Component Value Date/Time   LIPASE 30 07/22/2023 1534    Studies/Results: DG Abd Portable 1V-Small Bowel Protocol-Position Verification Result Date: 07/22/2023 CLINICAL DATA:  NG tube placement EXAM: PORTABLE ABDOMEN - 1 VIEW COMPARISON:  None Available. FINDINGS: NG tube tip is in the distal stomach. IMPRESSION: NG tube in the distal stomach. Electronically Signed   By: Janeece Mechanic M.D.   On: 07/22/2023 23:31   CT ABDOMEN PELVIS W CONTRAST Result Date: 07/22/2023 CLINICAL DATA:  Acute abdominal pain. EXAM: CT ABDOMEN AND PELVIS WITH CONTRAST TECHNIQUE: Multidetector CT imaging of the abdomen and pelvis was performed using the standard protocol following bolus administration of intravenous contrast. RADIATION DOSE REDUCTION: This exam was performed according to the departmental dose-optimization program which includes automated exposure control, adjustment of the mA and/or kV according to patient size and/or use of iterative reconstruction technique. CONTRAST:  75mL ISOVUE -370 IOPAMIDOL  (ISOVUE -370) INJECTION 76% COMPARISON:  MRI abdomen 05/08/2023. CT abdomen and pelvis 04/23/2023. FINDINGS: Lower chest: No acute abnormality. Hepatobiliary: No focal liver abnormality  is seen. Status post cholecystectomy. No biliary dilatation. Pancreas: Unremarkable. No pancreatic ductal dilatation or surrounding inflammatory changes. Spleen: Normal in size without focal abnormality. Adrenals/Urinary Tract: Bilateral adrenal glands are within normal limits. Horseshoe kidney again noted. There is no hydronephrosis. The bladder surgically absent. Diverting ileostomy appears unchanged and uncomplicated. Stomach/Bowel: There are dilated proximal small bowel loops measuring up to 4.2 cm. Transition 0.8 not definitely seen, but likely somewhere in the mid abdomen.  Distal small bowel and colon are nondilated. The appendix is not seen. There is no pneumatosis, free air or wall thickening. The stomach is within normal limits. Vascular/Lymphatic: No significant vascular findings are present. No enlarged abdominal or pelvic lymph nodes. Reproductive: Status post hysterectomy. No adnexal masses. Other: No abdominal wall hernia or abnormality. No abdominopelvic ascites. Musculoskeletal: No fracture is seen. IMPRESSION: 1. Findings compatible with small-bowel obstruction. Transition point not definitely seen, but likely in the mid abdomen. 2. Horseshoe kidney. 3. Diverting ileostomy appears unchanged and uncomplicated. Electronically Signed   By: Tyron Gallon M.D.   On: 07/22/2023 18:19    Anti-infectives: Anti-infectives (From admission, onward)    None        Assessment/Plan SBO - CT w/ dilated proximal small bowel loops with transition point not definitely seen, but likely in the mid abdomen. Hx of [place]  pouch created for Interstitial cystitis in 2011. She had a perforation of the pouch in 2017 and underwent an additional bowel resection at that time. Also hx of c-section.  - Afebrile. No hypotension. CT without pneumatosis, free air or free fluid. No peritonitis on exam. WBC wnl. Lactic acid wnl on last check. No current indication for emergency surgery. Will need to monitor tachycardia to ensure that this improves. Continue fluid resuscitation for AKI. If any change in hemodynamics, please let our team know.  - Cont NGT for decompression and keep NPO - Cont SBO protocol - 8 hour delay film planned for later this am.  - Keep K >=4, Phos >= 3, Mg >= 2 and mobilize for bowel function. Okay to clamp NGT for mobilization.  - Hopefully patient will improve with conservative management. If patient fails to improve with conservative management, they may require exploratory surgery during admission -  We will follow with you.  FEN - NPO, NGT to LIWS, IVF per  primary  VTE - SCDs, okay for chem ppx from a general surgery standpoint ID - None  I reviewed nursing notes, hospitalist notes, last 24 h vitals and pain scores, last 48 h intake and output, last 24 h labs and trends, and last 24 h imaging results   LOS: 1 day    Delton Filbert, [facility] Surgery 07/23/2023, 7:29 AM Please see Amion for pager number during day hours 7:00am-4:30pm

## 2023-07-23 NOTE — ED Notes (Signed)
 Pt refusing finger sticks and insulin . Del Favia MD notified.

## 2023-07-23 NOTE — Progress Notes (Signed)
 PROGRESS NOTE  [name] UEA:[id number] DOB: 1980-04-19   PCP: Avva, Ravisankar, MD  Patient is from: Home.  DOA: 07/22/2023 LOS: 1  Chief complaints Chief Complaint  Patient presents with   Abdominal Pain     Brief Narrative / Interim history: 43 year old F with PMH of GAVE, cystectomy s/p diverting ileostomy, liver lesion followed by GI, right ovarian cyst followed by Gyn, IDA on IV iron , hypothyroidism, fibromyalgia and obesity presenting with nausea, vomiting and abdominal pain for 1 day, and admitted with concern for small bowel obstruction with no clear transition point on CT.  Also had a couple of mild hematemesis on presentation.  She has been having liquid bowel movements during this time.   In ED, CT concerning for SBO without clear transition point.  She had AKI with creatinine of 1.91.  Some signs of hemoconcentration on CBC.  General surgery consulted.  NG tube placed.  Started on IV fluid and PPI  Subjective: Seen and examined earlier this morning.  No major events overnight of this morning.  Reports feeling better from pain standpoint.  She rates her pain 5/10 at the moment.  She says she had 3 liquid bowel movements.  Denies blood in the stool.  Objective: Vitals:   07/23/23 0900 07/23/23 0915 07/23/23 0930 07/23/23 1015  BP: 122/88 121/80 123/79 126/82  Pulse: (!) 109 (!) 109 (!) 104 (!) 110  Resp: 14 14 18  (!) 21  Temp:      TempSrc:      SpO2: 100% 99% 100% 98%  Weight:      Height:        Examination:  GENERAL: No apparent distress.  Nontoxic. HEENT: MMM.  Vision and hearing grossly intact.  NECK: Supple.  No apparent JVD.  RESP:  No IWOB.  Fair aeration bilaterally. CVS: HR in 110s.  Regular rhythm.  Heart sounds normal.  ABD/GI/GU: BS+. Abd soft, NTND.  MSK/EXT:  Moves extremities. No apparent deformity. No edema.  SKIN: no apparent skin lesion or wound NEURO: Awake, alert and oriented appropriately.  No apparent focal neuro deficit. PSYCH:  Calm. Normal affect.   Consultants:  General surgery  Procedures: None  Microbiology summarized: None  Assessment and plan: SBO likely partial.  Presents with nausea, vomiting and abdominal pain for 1 day.  She says she has had liquid bowel movements during this period.  CT abdomen raises concern for SBO with no definite transition point.  Has had multiple abdominal surgeries in the past.  Continues to have liquidy bowel months. -General Surgery on board-n.p.o., SBO protocol, NG to LIWS, IVF and electrolytes -Changed fluid from NS to sodium bicarbonate due to anion gap metabolic acidosis -Repeat lactic acid given high anion gap metabolic acidosis. -Encouraged to mobilize  History of GAVE -Continue PPI  AKI: b/l Cr ~0.8-1.0.  Likely prerenal from GI loss.  Also on Micardis that might contribute. Recent Labs    07/24/22 0954 02/20/23 1315 04/23/23 0746 07/22/23 1534 07/22/23 1609 07/23/23 0553  BUN 15 17  --  23* 28* 27*  CREATININE 0.82 0.82 1.00 1.91* 1.80* 1.45*  - Hold Micardis - IV fluid as above  High anion gap metabolic acidosis: Due to AKI?  A BUN is only 27.  Has mild hyperglycemia but not diabetic. -Check lactic acid -Change NS to sodium bicarbonate infusion  History of interstitial cystitis s/p status post diverting ileostomy.  No issues on CT abdomen and pelvis.  Horseshoe kidney-noted on CT.  Hyperglycemia: A1c 5.2%.  Likely  demargination -Monitor glucose with daily labs  History of anxiety/depression/bipolar disorder/ADHD/chronic pain - IV Ativan  and IV Dilaudid  as needed while NPO - Resume home Lamictal and lithium  once able to take p.o.  Secondary polycythemia: Likely due to dehydration.  Resolved.  Hypothyroidism - Will resume home Synthroid  once able to take p.o.  Sinus tachycardia: Seems she is on Toprol-XL 100 mg daily at home. - IV metoprolol 5 mg every 8 hours while NPO   Anemia ruled out.    Class II obesity Body mass index is 35.51  kg/m. - On Ozempic.          DVT prophylaxis:  SCDs Start: 07/22/23 1955  Code Status: Full code Family Communication: Updated patient's husband at bedside Level of care: Progressive Status is: Inpatient Remains inpatient appropriate because: Small bowel obstruction, AKI, dehydration   Final disposition: Home   55 minutes with more than 50% spent in reviewing records, counseling patient/family and coordinating care.   Sch Meds:  Scheduled Meds:  acetaminophen   650 mg Oral Once   diphenhydrAMINE   25 mg Oral Once   metoprolol tartrate  5 mg Intravenous Q8H   Continuous Infusions:  sodium bicarbonate 150 mEq in dextrose 5 % 1,150 mL infusion     PRN Meds:.bisacodyl , HYDROmorphone  (DILAUDID ) injection, LORazepam , ondansetron  (ZOFRAN ) IV, prochlorperazine   Antimicrobials: Anti-infectives (From admission, onward)    None        I have personally reviewed the following labs and images: CBC: Recent Labs  Lab 07/22/23 1534 07/22/23 1609 07/23/23 0553  WBC 9.0  --  5.6  NEUTROABS 6.3  --   --   HGB 16.9* 18.7* 15.2*  HCT 55.2* 55.0* 48.0*  MCV 93.4  --  90.9  PLT 569*  --  479*   BMP &GFR Recent Labs  Lab 07/22/23 1534 07/22/23 1609 07/23/23 0553  NA 133* 134* 135  K 4.7 4.6 3.9  CL 101 106 105  CO2 15*  --  14*  GLUCOSE 168* 172* 156*  BUN 23* 28* 27*  CREATININE 1.91* 1.80* 1.45*  CALCIUM 10.8*  --  9.6  MG 2.0  --  2.0  PHOS  --   --  4.5   Estimated Creatinine Clearance: 59.6 mL/min (A) (by C-G formula based on SCr of 1.45 mg/dL (H)). Liver & Pancreas: Recent Labs  Lab 07/22/23 1534  AST 31  ALT 23  ALKPHOS 37*  BILITOT 1.2  PROT 8.9*  ALBUMIN 5.0   Recent Labs  Lab 07/22/23 1534  LIPASE 30   No results for input(s): "AMMONIA" in the last 168 hours. Diabetic: Recent Labs    07/22/23 2220  HGBA1C 5.2   No results for input(s): "GLUCAP" in the last 168 hours. Cardiac Enzymes: No results for input(s): "CKTOTAL", "CKMB",  "CKMBINDEX", "TROPONINI" in the last 168 hours. No results for input(s): "PROBNP" in the last 8760 hours. Coagulation Profile: No results for input(s): "INR", "PROTIME" in the last 168 hours. Thyroid  Function Tests: No results for input(s): "TSH", "T4TOTAL", "FREET4", "T3FREE", "THYROIDAB" in the last 72 hours. Lipid Profile: No results for input(s): "CHOL", "HDL", "LDLCALC", "TRIG", "CHOLHDL", "LDLDIRECT" in the last 72 hours. Anemia Panel: No results for input(s): "VITAMINB12", "FOLATE", "FERRITIN", "TIBC", "IRON ", "RETICCTPCT" in the last 72 hours. Urine analysis:    Component Value Date/Time   COLORURINE YELLOW 02/20/2023 1316   APPEARANCEUR CLOUDY (A) 02/20/2023 1316   LABSPEC 1.015 02/20/2023 1316   PHURINE 6.0 02/20/2023 1316   GLUCOSEU NEGATIVE 02/20/2023 1316   HGBUR NEGATIVE  02/20/2023 1316   BILIRUBINUR NEGATIVE 02/20/2023 1316   KETONESUR NEGATIVE 02/20/2023 1316   PROTEINUR 30 (A) 02/20/2023 1316   UROBILINOGEN 0.2 02/17/2009 1445   NITRITE POSITIVE (A) 02/20/2023 1316   LEUKOCYTESUR MODERATE (A) 02/20/2023 1316   Sepsis Labs: Invalid input(s): "PROCALCITONIN", "LACTICIDVEN"  Microbiology: No results found for this or any previous visit (from the past 240 hours).  Radiology Studies: DG Abd Portable 1V-Small Bowel Protocol-Position Verification Result Date: 07/22/2023 CLINICAL DATA:  NG tube placement EXAM: PORTABLE ABDOMEN - 1 VIEW COMPARISON:  None Available. FINDINGS: NG tube tip is in the distal stomach. IMPRESSION: NG tube in the distal stomach. Electronically Signed   By: Janeece Mechanic M.D.   On: 07/22/2023 23:31   CT ABDOMEN PELVIS W CONTRAST Result Date: 07/22/2023 CLINICAL DATA:  Acute abdominal pain. EXAM: CT ABDOMEN AND PELVIS WITH CONTRAST TECHNIQUE: Multidetector CT imaging of the abdomen and pelvis was performed using the standard protocol following bolus administration of intravenous contrast. RADIATION DOSE REDUCTION: This exam was performed according  to the departmental dose-optimization program which includes automated exposure control, adjustment of the mA and/or kV according to patient size and/or use of iterative reconstruction technique. CONTRAST:  75mL ISOVUE -370 IOPAMIDOL  (ISOVUE -370) INJECTION 76% COMPARISON:  MRI abdomen 05/08/2023. CT abdomen and pelvis 04/23/2023. FINDINGS: Lower chest: No acute abnormality. Hepatobiliary: No focal liver abnormality is seen. Status post cholecystectomy. No biliary dilatation. Pancreas: Unremarkable. No pancreatic ductal dilatation or surrounding inflammatory changes. Spleen: Normal in size without focal abnormality. Adrenals/Urinary Tract: Bilateral adrenal glands are within normal limits. Horseshoe kidney again noted. There is no hydronephrosis. The bladder surgically absent. Diverting ileostomy appears unchanged and uncomplicated. Stomach/Bowel: There are dilated proximal small bowel loops measuring up to 4.2 cm. Transition 0.8 not definitely seen, but likely somewhere in the mid abdomen. Distal small bowel and colon are nondilated. The appendix is not seen. There is no pneumatosis, free air or wall thickening. The stomach is within normal limits. Vascular/Lymphatic: No significant vascular findings are present. No enlarged abdominal or pelvic lymph nodes. Reproductive: Status post hysterectomy. No adnexal masses. Other: No abdominal wall hernia or abnormality. No abdominopelvic ascites. Musculoskeletal: No fracture is seen. IMPRESSION: 1. Findings compatible with small-bowel obstruction. Transition point not definitely seen, but likely in the mid abdomen. 2. Horseshoe kidney. 3. Diverting ileostomy appears unchanged and uncomplicated. Electronically Signed   By: Tyron Gallon M.D.   On: 07/22/2023 18:19      Samiksha Pellicano T. Stefhanie Kachmar [facility]  If 7PM-7AM, please contact night-coverage [email or web address] 07/23/2023, 11:03 AM

## 2023-07-23 NOTE — Progress Notes (Signed)
 [facility] night cross cover note:   I was notified by the patient's RN that the patient is requesting medication for abdominal discomfort as well as throat discomfort.  She is here with small bowel obstruction, and recently had removal of her NG tube.  She was previously on prn IV Dilaudid , although this order has subsequently expired.  I subsequently resumed prior prn IV Dilaudid  and added as needed Cepacol throat lozenges to her existing order for Chloraseptic throat spray.     Camelia Cavalier, DO Hospitalist

## 2023-07-23 NOTE — Progress Notes (Signed)
 Patient arrives to 5W26 at this time via stretcher from the ED.  Patient ambulatory from stretcher to bed with steady gait noted.

## 2023-07-23 NOTE — ED Notes (Signed)
 Hooked patient back up to the monitor patient has family at bedside and call bell in reach

## 2023-07-24 ENCOUNTER — Encounter: Payer: Self-pay | Admitting: Physician Assistant

## 2023-07-24 ENCOUNTER — Other Ambulatory Visit (HOSPITAL_COMMUNITY): Payer: Self-pay

## 2023-07-24 DIAGNOSIS — K56609 Unspecified intestinal obstruction, unspecified as to partial versus complete obstruction: Secondary | ICD-10-CM | POA: Diagnosis not present

## 2023-07-24 LAB — RENAL FUNCTION PANEL
Albumin: 3.7 g/dL (ref 3.5–5.0)
Anion gap: 14 (ref 5–15)
BUN: 22 mg/dL — ABNORMAL HIGH (ref 6–20)
CO2: 18 mmol/L — ABNORMAL LOW (ref 22–32)
Calcium: 8.4 mg/dL — ABNORMAL LOW (ref 8.9–10.3)
Chloride: 103 mmol/L (ref 98–111)
Creatinine, Ser: 0.99 mg/dL (ref 0.44–1.00)
GFR, Estimated: >60 mL/min (ref >60)
Glucose, Bld: 128 mg/dL — ABNORMAL HIGH (ref 70–99)
Phosphorus: 2.5 mg/dL (ref 2.5–4.6)
Potassium: 3.4 mmol/L — ABNORMAL LOW (ref 3.5–5.1)
Sodium: 135 mmol/L (ref 135–145)

## 2023-07-24 LAB — CBC
HCT: 44.1 % (ref 36.0–46.0)
Hemoglobin: 14.3 g/dL (ref 12.0–15.0)
MCH: 29 pg (ref 26.0–34.0)
MCHC: 32.4 g/dL (ref 30.0–36.0)
MCV: 89.5 fL (ref 80.0–100.0)
Platelets: 435 10*3/uL — ABNORMAL HIGH (ref 150–400)
RBC: 4.93 MIL/uL (ref 3.87–5.11)
RDW: 13.3 % (ref 11.5–15.5)
WBC: 5.6 10*3/uL (ref 4.0–10.5)
nRBC: 0 % (ref 0.0–0.2)

## 2023-07-24 LAB — MISC LABCORP TEST (SEND OUT): Labcorp test code: 83935

## 2023-07-24 LAB — MAGNESIUM: Magnesium: 2 mg/dL (ref 1.7–2.4)

## 2023-07-24 MED ORDER — LEVOFLOXACIN 500 MG PO TABS
500.0000 mg | ORAL_TABLET | Freq: Every day | ORAL | Status: DC
  Administered 2023-07-24: 500 mg via ORAL
  Filled 2023-07-24: qty 1

## 2023-07-24 MED ORDER — LEVOFLOXACIN 500 MG PO TABS
500.0000 mg | ORAL_TABLET | Freq: Every day | ORAL | 0 refills | Status: DC
  Filled 2023-07-24: qty 2, 2d supply, fill #0

## 2023-07-24 MED ORDER — SODIUM CHLORIDE 0.45 % IV SOLN
INTRAVENOUS | Status: DC
  Filled 2023-07-24: qty 75

## 2023-07-24 MED ORDER — ONDANSETRON HCL 4 MG PO TABS
2.0000 mg | ORAL_TABLET | Freq: Three times a day (TID) | ORAL | 0 refills | Status: AC | PRN
  Filled 2023-07-24: qty 20, 7d supply, fill #0

## 2023-07-24 MED ORDER — POTASSIUM CHLORIDE CRYS ER 20 MEQ PO TBCR
40.0000 meq | EXTENDED_RELEASE_TABLET | Freq: Once | ORAL | Status: AC
Start: 2023-07-24 — End: 2023-07-24
  Administered 2023-07-24: 40 meq via ORAL
  Filled 2023-07-24: qty 2

## 2023-07-24 MED ORDER — POTASSIUM CHLORIDE 10 MEQ/100ML IV SOLN
10.0000 meq | INTRAVENOUS | Status: DC
  Administered 2023-07-24 (×2): 10 meq via INTRAVENOUS
  Filled 2023-07-24 (×2): qty 100

## 2023-07-24 MED ORDER — LEVOFLOXACIN IN D5W 500 MG/100ML IV SOLN
500.0000 mg | INTRAVENOUS | Status: DC
  Filled 2023-07-24: qty 100

## 2023-07-24 NOTE — Discharge Instructions (Signed)
 Follow with Primary MD Avva, Ravisankar, MD in 7 days   Get CBC, CMP, 2 view Chest X ray -  checked next visit with your primary MD   Activity: As tolerated with Full fall precautions use walker/cane & assistance as needed  Disposition Home    Diet: Soft diet for the next 1 to 2 days, advance to regular consistency heart healthy diet as tolerated after 1 to 2 days.  Special Instructions: If you have smoked or chewed Tobacco  in the last 2 yrs please stop smoking, stop any regular Alcohol   and or any Recreational drug use.  On your next visit with your primary care physician please Get Medicines reviewed and adjusted.  Please request your Prim.MD to go over all Hospital Tests and Procedure/Radiological results at the follow up, please get all Hospital records sent to your Prim MD by signing hospital release before you go home.  If you experience worsening of your admission symptoms, develop shortness of breath, life threatening emergency, suicidal or homicidal thoughts you must seek medical attention immediately by calling 911 or calling your MD immediately  if symptoms less severe.  You Must read complete instructions/literature along with all the possible adverse reactions/side effects for all the Medicines you take and that have been prescribed to you. Take any new Medicines after you have completely understood and accpet all the possible adverse reactions/side effects.   Do not drive when taking Pain medications.  Do not take more than prescribed Pain, Sleep and Anxiety Medications  Wear Seat belts while driving.

## 2023-07-24 NOTE — Progress Notes (Signed)
 Subjective: CC: Abdominal pain nearly resolved - just some soreness left hemiabdomen with palpation, tolerated almost 100% of CLD breakfast without nausea. Having flatus and BMs.   Objective: Vital signs in last 24 hours: Temp:  [97.9 F (36.6 C)-98.9 F (37.2 C)] 98.3 F (36.8 C) (05/21 0400) Pulse Rate:  [84-126] 102 (05/21 0400) Resp:  [14-23] 14 (05/21 0400) BP: (116-142)/(73-98) 121/81 (05/21 0400) SpO2:  [92 %-100 %] 95 % (05/21 0400) Last BM Date : 07/23/23  Intake/Output from previous day: 05/20 0701 - 05/21 0700 In: -  Out: 805 [Urine:300; Emesis/NG output:500; Stool:5] Intake/Output this shift: No intake/output data recorded.  PE: Gen:  Alert, NAD, pleasant Abd: Soft, mild distension, mild TTP left abdomen, no gaurding, Prior midline scar well healed. Urostomy stoma noted.   Lab Results:  Recent Labs    07/23/23 0553 07/24/23 0358  WBC 5.6 5.6  HGB 15.2* 14.3  HCT 48.0* 44.1  PLT 479* 435*   BMET Recent Labs    07/23/23 0553 07/24/23 0358  NA 135 135  K 3.9 3.4*  CL 105 103  CO2 14* 18*  GLUCOSE 156* 128*  BUN 27* 22*  CREATININE 1.45* 0.99  CALCIUM 9.6 8.4*   PT/INR No results for input(s): "LABPROT", "INR" in the last 72 hours. CMP     Component Value Date/Time   NA 135 07/24/2023 0358   K 3.4 (L) 07/24/2023 0358   CL 103 07/24/2023 0358   CO2 18 (L) 07/24/2023 0358   GLUCOSE 128 (H) 07/24/2023 0358   BUN 22 (H) 07/24/2023 0358   CREATININE 0.99 07/24/2023 0358   CREATININE 0.82 07/24/2022 0954   CALCIUM 8.4 (L) 07/24/2023 0358   PROT 8.9 (H) 07/22/2023 1534   ALBUMIN 3.7 07/24/2023 0358   AST 31 07/22/2023 1534   AST 21 07/24/2022 0954   ALT 23 07/22/2023 1534   ALT 20 07/24/2022 0954   ALKPHOS 37 (L) 07/22/2023 1534   BILITOT 1.2 07/22/2023 1534   BILITOT 0.4 07/24/2022 0954   GFRNONAA >60 07/24/2023 0358   GFRNONAA >60 07/24/2022 0954   GFRAA 60 (L) 05/23/2017 1000   Lipase     Component Value Date/Time   LIPASE  30 07/22/2023 1534    Studies/Results: DG Abd Portable 1V-Small Bowel Obstruction Protocol-initial, 8 hr delay Result Date: 07/23/2023 CLINICAL DATA:  Small bowel obstruction. EXAM: PORTABLE ABDOMEN - 1 VIEW COMPARISON:  Jul 22, 2023 FINDINGS: Nasogastric tube tip is seen in expected position of distal stomach. Stable small bowel dilatation is noted concerning for distal small bowel obstruction. Contrast is noted in nondilated colon. Surgical clips are noted in pelvis. IMPRESSION: Stable small bowel dilatation seen concerning for distal small bowel obstruction. Contrast is noted in nondilated colon. Electronically Signed   By: Rosalene Colon M.D.   On: 07/23/2023 14:31   DG Abd Portable 1V-Small Bowel Protocol-Position Verification Result Date: 07/22/2023 CLINICAL DATA:  NG tube placement EXAM: PORTABLE ABDOMEN - 1 VIEW COMPARISON:  None Available. FINDINGS: NG tube tip is in the distal stomach. IMPRESSION: NG tube in the distal stomach. Electronically Signed   By: Janeece Mechanic M.D.   On: 07/22/2023 23:31   CT ABDOMEN PELVIS W CONTRAST Result Date: 07/22/2023 CLINICAL DATA:  Acute abdominal pain. EXAM: CT ABDOMEN AND PELVIS WITH CONTRAST TECHNIQUE: Multidetector CT imaging of the abdomen and pelvis was performed using the standard protocol following bolus administration of intravenous contrast. RADIATION DOSE REDUCTION: This exam was performed according to the  departmental dose-optimization program which includes automated exposure control, adjustment of the mA and/or kV according to patient size and/or use of iterative reconstruction technique. CONTRAST:  75mL ISOVUE -370 IOPAMIDOL  (ISOVUE -370) INJECTION 76% COMPARISON:  MRI abdomen 05/08/2023. CT abdomen and pelvis 04/23/2023. FINDINGS: Lower chest: No acute abnormality. Hepatobiliary: No focal liver abnormality is seen. Status post cholecystectomy. No biliary dilatation. Pancreas: Unremarkable. No pancreatic ductal dilatation or surrounding  inflammatory changes. Spleen: Normal in size without focal abnormality. Adrenals/Urinary Tract: Bilateral adrenal glands are within normal limits. Horseshoe kidney again noted. There is no hydronephrosis. The bladder surgically absent. Diverting ileostomy appears unchanged and uncomplicated. Stomach/Bowel: There are dilated proximal small bowel loops measuring up to 4.2 cm. Transition 0.8 not definitely seen, but likely somewhere in the mid abdomen. Distal small bowel and colon are nondilated. The appendix is not seen. There is no pneumatosis, free air or wall thickening. The stomach is within normal limits. Vascular/Lymphatic: No significant vascular findings are present. No enlarged abdominal or pelvic lymph nodes. Reproductive: Status post hysterectomy. No adnexal masses. Other: No abdominal wall hernia or abnormality. No abdominopelvic ascites. Musculoskeletal: No fracture is seen. IMPRESSION: 1. Findings compatible with small-bowel obstruction. Transition point not definitely seen, but likely in the mid abdomen. 2. Horseshoe kidney. 3. Diverting ileostomy appears unchanged and uncomplicated. Electronically Signed   By: Tyron Gallon M.D.   On: 07/22/2023 18:19    Anti-infectives: Anti-infectives (From admission, onward)    Start     Dose/Rate Route Frequency Ordered Stop   07/24/23 0600  levofloxacin (LEVAQUIN) IVPB 500 mg        500 mg 100 mL/hr over 60 Minutes Intravenous Every 24 hours 07/24/23 0527 07/27/23 0559   07/23/23 1915  fluconazole (DIFLUCAN) IVPB 200 mg        200 mg 100 mL/hr over 60 Minutes Intravenous  Once 07/23/23 1815 07/23/23 2322        Assessment/Plan SBO - CT w/ dilated proximal small bowel loops with transition point not definitely seen, but likely in the mid abdomen. Hx of [place]  pouch created for Interstitial cystitis in 2011. She had a perforation of the pouch in 2017 and underwent an additional bowel resection at that time. Also hx of c-section.  - Afebrile.  Vitals stable. WBC wnl. AKI improved. - NGT, SBO protocol >> contrast in colon and return of bowel function. Advance to soft diet. - no emergent surgical needs. Ok for discharge later today if tolerating PO without recurrence of obstructive sxs. General surgery will sign off.  FEN - soft diet, IVF per primary  VTE - SCDs, okay for chem ppx from a general surgery standpoint ID - None  I reviewed nursing notes, hospitalist notes, last 24 h vitals and pain scores, last 48 h intake and output, last 24 h labs and trends, and last 24 h imaging results   LOS: 2 days    Charlott Converse, [facility] Surgery 07/24/2023, 9:29 AM Please see Amion for pager number during day hours 7:00am-4:30pm

## 2023-07-24 NOTE — TOC Transition Note (Signed)
 Transition of Care [facility]) - Discharge Note   Patient Details  Name: [name] MRN: [id number] Date of Birth: 1980-07-29  Transition of [facility]) CM/SW Contact:  Eusebio High, RN Phone Number: 07/24/2023, 10:05 AM   Clinical Narrative:     Patient will DC to home today. No TOC needs identified. Patient will follow up as directed on AVS     Spouse will transport          Patient Goals and CMS Choice            Discharge Placement                       Discharge Plan and Services Additional resources added to the After Visit Summary for                                       Social Drivers of Health (SDOH) Interventions SDOH Screenings   Food Insecurity: No Food Insecurity (07/23/2023)  Housing: Low Risk  (07/23/2023)  Transportation Needs: No Transportation Needs (07/23/2023)  Utilities: Not At Risk (07/23/2023)  Social Connections: Unknown (07/23/2023)  Tobacco Use: Low Risk  (07/23/2023)     Readmission Risk Interventions     No data to display

## 2023-07-24 NOTE — Discharge Summary (Addendum)
 [name] YQM:[id number] DOB: 06/12/1980 DOA: 07/22/2023  PCP: Lindsay Robins, MD  Admit date: 07/22/2023  Discharge date: 07/24/2023  Admitted From: Home   Disposition:  Home   Recommendations for Outpatient Follow-up:   Follow up with PCP in 1-2 weeks  PCP Please obtain BMP/CBC, 2 view CXR in 1week,  (see Discharge instructions)   PCP Please follow up on the following pending results:    Home Health: None   Equipment/Devices: None  Consultations: CCS Discharge Condition: Stable    CODE STATUS: Full    Diet Recommendation: Soft Diet for the next 2 to 3 days, advance to regular consistency heart healthy diet as tolerated    Chief Complaint  Patient presents with   Abdominal Pain     Brief history of present illness from the day of admission and additional interim summary    43 year old F with PMH of GAVE, cystectomy s/p diverting ileostomy, liver lesion followed by GI, right ovarian cyst followed by Gyn, IDA on IV iron , hypothyroidism, fibromyalgia and obesity presenting with nausea, vomiting and abdominal pain for 1 day, and admitted with concern for small bowel obstruction with no clear transition point on CT.  Also had a couple of mild hematemesis on presentation.  She presented to the ER diagnosed with small bowel obstruction and admitted.                                                                 Hospital Course    SBO likely partial.  Presents with nausea, vomiting and abdominal pain for 1 day.  She has had multiple abdominal surgeries in the past however she is also on Ozempic which likely is the culprit, she took Ozempic last on Monday, with supportive care she is now symptom-free having bowel movements, placed on soft diet, tolerating it well cleared by general surgery for discharge.   Will be discharged requested her to stop taking Ozempic, follow-up with PCP in 2 to 3 days.   History of GAVE  - Continue PPI   AKI: Renal resolved.  High anion gap metabolic acidosis: Due to dehydration and AKI.  Resolved.   History of interstitial cystitis s/p status post diverting ileostomy.  No issues on CT abdomen and pelvis.   Horseshoe kidney-noted on CT.   Hyperglycemia: A1c 5.2%.  Likely demargination -Monitor glucose with daily labs   History of anxiety/depression/bipolar disorder/ADHD/chronic pain - Stable continue home regimen   Secondary polycythemia: Likely due to dehydration.  Resolved.   Hypothyroidism - Will resume home Synthroid  once able to take p.o.   History of sinus tachycardia: Resume home dose beta-blocker   Hypokalemia.  Replaced.     UTI.  3 days of empiric Levaquin.    Class II obesity Body mass index is 35.51 kg/m. - Ozempic Discharge  diagnosis     Principal Problem:   SBO (small bowel obstruction) (HCC) Active Problems:   Hypothyroidism   ADHD (attention deficit hyperactivity disorder)   Depression   Fibromyalgia   Autoimmune connective tissue disorder (HCC)   Iron  deficiency anemia   GAVE (gastric antral vascular ectasia)   History of total cystectomy   Bipolar 1 disorder (HCC)   Anxiety   AKI (acute kidney injury) [facility])    Discharge instructions    Discharge Instructions     Discharge instructions   Complete by: As directed    Follow with Primary MD Avva, Ravisankar, MD in 7 days   Get CBC, CMP, 2 view Chest X ray -  checked next visit with your primary MD   Activity: As tolerated with Full fall precautions use walker/cane & assistance as needed  Disposition Home    Diet: Soft diet for the next 1 to 2 days, advance to regular consistency heart healthy diet as tolerated after 1 to 2 days.  Special Instructions: If you have smoked or chewed Tobacco  in the last 2 yrs please stop smoking, stop any regular Alcohol   and  or any Recreational drug use.  On your next visit with your primary care physician please Get Medicines reviewed and adjusted.  Please request your Prim.MD to go over all Hospital Tests and Procedure/Radiological results at the follow up, please get all Hospital records sent to your Prim MD by signing hospital release before you go home.  If you experience worsening of your admission symptoms, develop shortness of breath, life threatening emergency, suicidal or homicidal thoughts you must seek medical attention immediately by calling 911 or calling your MD immediately  if symptoms less severe.  You Must read complete instructions/literature along with all the possible adverse reactions/side effects for all the Medicines you take and that have been prescribed to you. Take any new Medicines after you have completely understood and accpet all the possible adverse reactions/side effects.   Do not drive when taking Pain medications.  Do not take more than prescribed Pain, Sleep and Anxiety Medications  Wear Seat belts while driving.   Increase activity slowly   Complete by: As directed        Discharge Medications   Allergies as of 07/24/2023       Reactions   Keflex [cephalexin] Hives   Can take with antihistamine   Penicillins Hives, Other (See Comments)   PATIENT HAS HAD A PCN REACTION WITH IMMEDIATE RASH, FACIAL/TONGUE/THROAT SWELLING, SOB, OR LIGHTHEADEDNESS WITH HYPOTENSION:  #  #  #  YES  #  #  #   Has patient had a PCN reaction causing severe rash involving mucus membranes or skin necrosis: No Has patient had a PCN reaction that required hospitalization No Has patient had a PCN reaction occurring within the last 10 years: No If all of the above answers are "NO", then may proceed with Cephalosporin use.   Sulfa Antibiotics Hives        Medication List     STOP taking these medications    Caplyta 42 MG capsule Generic drug: lumateperone tosylate   oxybutynin 5 MG/5ML  syrup Commonly known as: DITROPAN   Ozempic (0.25 or 0.5 MG/DOSE) 2 MG/3ML Sopn Generic drug: Semaglutide(0.25 or 0.5MG /DOS)       TAKE these medications    acetaminophen  650 MG CR tablet Commonly known as: TYLENOL  Take 1,300 mg by mouth 2 (two) times daily.   amantadine 100 MG capsule Commonly known  as: SYMMETREL Take 100 mg by mouth 2 (two) times daily.   amphetamine -dextroamphetamine  30 MG tablet Commonly known as: ADDERALL Take 30 mg by mouth 2 (two) times daily.   Austedo XR 48 MG Tb24 Generic drug: Deutetrabenazine ER Take 1 tablet by mouth every evening.   clonazePAM  2 MG tablet Commonly known as: KLONOPIN  Take 1 mg by mouth 3 (three) times daily as needed for anxiety.   D3-50 1.25 MG (50000 UT) capsule Generic drug: Cholecalciferol Take 50,000 Units by mouth 3 (three) times a week. Take one capsule by mouth on Tuesdays, Thursdays and Saturdays per patient   dicyclomine  10 MG capsule Commonly known as: BENTYL  TAKE 1 CAPSULE BY MOUTH TWICE A DAY   diphenoxylate -atropine  2.5-0.025 MG tablet Commonly known as: LOMOTIL  Take 1 tablet by mouth 4 (four) times daily as needed for diarrhea or loose stools (IBS).   fenofibrate 160 MG tablet Take 160 mg by mouth at bedtime.   lamoTRIgine 200 MG tablet Commonly known as: LAMICTAL Take 400 mg by mouth at bedtime.   levonorgestrel-ethinyl estradiol 0.15-0.03 MG tablet Commonly known as: SEASONALE Take 1 tablet by mouth in the morning.   levothyroxine  125 MCG tablet Commonly known as: SYNTHROID  Take 125 mcg by mouth daily before breakfast.   lithium  carbonate 450 MG ER tablet Commonly known as: ESKALITH Take 675 mg by mouth at bedtime.   metoprolol succinate 100 MG 24 hr tablet Commonly known as: TOPROL-XL Take 100 mg by mouth at bedtime.   nitrofurantoin 50 MG capsule Commonly known as: MACRODANTIN Take 50 mg by mouth at bedtime.   nystatin cream Commonly known as: MYCOSTATIN Apply 1 Application  topically daily as needed (vaginal itching).   omeprazole  40 MG capsule Commonly known as: PRILOSEC Take 1 capsule (40 mg total) by mouth daily.   ondansetron  4 MG tablet Commonly known as: ZOFRAN  Take 0.5-1 tablets (2-4 mg total) by mouth every 8 (eight) hours as needed for vomiting or nausea.   ramelteon 8 MG tablet Commonly known as: ROZEREM Take 1 tablet by mouth at bedtime.   rOPINIRole 3 MG tablet Commonly known as: REQUIP Take 3 mg by mouth at bedtime.   rosuvastatin 5 MG tablet Commonly known as: CRESTOR Take 1 tablet by mouth at bedtime.   Safety Seal Miscellaneous Misc Apply 1 Application topically in the morning. Medication Name: Hormonic Hair Solution (Minoxidil 8%, Finasteride 0.05%)   telmisartan 40 MG tablet Commonly known as: MICARDIS Take 40 mg by mouth at bedtime.   tiZANidine 4 MG tablet Commonly known as: ZANAFLEX Take 8 mg by mouth 2 (two) times daily.   triamcinolone cream 0.1 % Commonly known as: KENALOG Apply 1 Application topically daily as needed (itching).         Follow-up Information     Avva, Ravisankar, MD. Schedule an appointment as soon as possible for a visit in 1 week(s).   Specialty: Internal Medicine Contact information: [address] [place] [address] [phone]                 Major procedures and Radiology Reports - PLEASE review detailed and final reports thoroughly  -       DG Abd Portable 1V-Small Bowel Obstruction Protocol-initial, 8 hr delay Result Date: 07/23/2023 CLINICAL DATA:  Small bowel obstruction. EXAM: PORTABLE ABDOMEN - 1 VIEW COMPARISON:  Jul 22, 2023 FINDINGS: Nasogastric tube tip is seen in expected position of distal stomach. Stable small bowel dilatation is noted concerning for distal small bowel obstruction. Contrast  is noted in nondilated colon. Surgical clips are noted in pelvis. IMPRESSION: Stable small bowel dilatation seen concerning for distal small bowel obstruction. Contrast is  noted in nondilated colon. Electronically Signed   By: Rosalene Colon M.D.   On: 07/23/2023 14:31   DG Abd Portable 1V-Small Bowel Protocol-Position Verification Result Date: 07/22/2023 CLINICAL DATA:  NG tube placement EXAM: PORTABLE ABDOMEN - 1 VIEW COMPARISON:  None Available. FINDINGS: NG tube tip is in the distal stomach. IMPRESSION: NG tube in the distal stomach. Electronically Signed   By: Janeece Mechanic M.D.   On: 07/22/2023 23:31   CT ABDOMEN PELVIS W CONTRAST Result Date: 07/22/2023 CLINICAL DATA:  Acute abdominal pain. EXAM: CT ABDOMEN AND PELVIS WITH CONTRAST TECHNIQUE: Multidetector CT imaging of the abdomen and pelvis was performed using the standard protocol following bolus administration of intravenous contrast. RADIATION DOSE REDUCTION: This exam was performed according to the departmental dose-optimization program which includes automated exposure control, adjustment of the mA and/or kV according to patient size and/or use of iterative reconstruction technique. CONTRAST:  75mL ISOVUE -370 IOPAMIDOL  (ISOVUE -370) INJECTION 76% COMPARISON:  MRI abdomen 05/08/2023. CT abdomen and pelvis 04/23/2023. FINDINGS: Lower chest: No acute abnormality. Hepatobiliary: No focal liver abnormality is seen. Status post cholecystectomy. No biliary dilatation. Pancreas: Unremarkable. No pancreatic ductal dilatation or surrounding inflammatory changes. Spleen: Normal in size without focal abnormality. Adrenals/Urinary Tract: Bilateral adrenal glands are within normal limits. Horseshoe kidney again noted. There is no hydronephrosis. The bladder surgically absent. Diverting ileostomy appears unchanged and uncomplicated. Stomach/Bowel: There are dilated proximal small bowel loops measuring up to 4.2 cm. Transition 0.8 not definitely seen, but likely somewhere in the mid abdomen. Distal small bowel and colon are nondilated. The appendix is not seen. There is no pneumatosis, free air or wall thickening. The stomach is  within normal limits. Vascular/Lymphatic: No significant vascular findings are present. No enlarged abdominal or pelvic lymph nodes. Reproductive: Status post hysterectomy. No adnexal masses. Other: No abdominal wall hernia or abnormality. No abdominopelvic ascites. Musculoskeletal: No fracture is seen. IMPRESSION: 1. Findings compatible with small-bowel obstruction. Transition point not definitely seen, but likely in the mid abdomen. 2. Horseshoe kidney. 3. Diverting ileostomy appears unchanged and uncomplicated. Electronically Signed   By: Tyron Gallon M.D.   On: 07/22/2023 18:19   US  PELVIC COMPLETE WITH TRANSVAGINAL Result Date: 07/14/2023 CLINICAL DATA:  Follow up ovarian cyst EXAM: TRANSABDOMINAL AND TRANSVAGINAL ULTRASOUND OF PELVIS TECHNIQUE: Both transabdominal and transvaginal ultrasound examinations of the pelvis were performed. Transabdominal technique was performed for global imaging of the pelvis including uterus, ovaries, adnexal regions, and pelvic cul-de-sac. It was necessary to proceed with endovaginal exam following the transabdominal exam to visualize the adnexal structures. COMPARISON:  Pelvic ultrasound 05/08/2023 FINDINGS: Uterus Surgically absent Right ovary Not visualized Left ovary Not visualized Other findings No abnormal free fluid. IMPRESSION: 1. Status post hysterectomy. 2. Nonvisualization of the ovaries. 3. Previously described cyst not visualized on current exam. Electronically Signed   By: Jone Neither M.D.   On: 07/14/2023 22:19    Micro Results     No results found for this or any previous visit (from the past 240 hours).  Today   Subjective    [name] today has no headache,no chest abdominal pain,no new weakness tingling or numbness, feels much better wants to go home today.     Objective   Blood pressure 121/81, pulse 90, temperature 98.3 F (36.8 C), temperature source Oral, resp. rate 14, height 5\' 6"  (1.676  m), weight 99.8 kg, SpO2  95%.   Intake/Output Summary (Last 24 hours) at 07/24/2023 0950 Last data filed at 07/23/2023 1347 Gross per 24 hour  Intake --  Output 805 ml  Net -805 ml    Exam  Awake Alert, No new F.N deficits,    [place].AT,PERRAL Supple Neck,   Symmetrical Chest wall movement, Good air movement bilaterally, CTAB RRR,No Gallops,   +ve B.Sounds, Abd Soft, Non tender,  No Cyanosis, Clubbing or edema    Data Review   Recent Labs  Lab 07/22/23 1534 07/22/23 1609 07/23/23 0553 07/24/23 0358  WBC 9.0  --  5.6 5.6  HGB 16.9* 18.7* 15.2* 14.3  HCT 55.2* 55.0* 48.0* 44.1  PLT 569*  --  479* 435*  MCV 93.4  --  90.9 89.5  MCH 28.6  --  28.8 29.0  MCHC 30.6  --  31.7 32.4  RDW 13.3  --  13.5 13.3  LYMPHSABS 1.2  --   --   --   MONOABS 1.2*  --   --   --   EOSABS 0.1  --   --   --   BASOSABS 0.1  --   --   --     Recent Labs  Lab 07/22/23 1534 07/22/23 1609 07/22/23 1939 07/22/23 1948 07/22/23 2220 07/23/23 0553 07/23/23 1131 07/24/23 0358  NA 133* 134*  --   --   --  135  --  135  K 4.7 4.6  --   --   --  3.9  --  3.4*  CL 101 106  --   --   --  105  --  103  CO2 15*  --   --   --   --  14*  --  18*  ANIONGAP 17*  --   --   --   --  16*  --  14  GLUCOSE 168* 172*  --   --   --  156*  --  128*  BUN 23* 28*  --   --   --  27*  --  22*  CREATININE 1.91* 1.80*  --   --   --  1.45*  --  0.99  AST 31  --   --   --   --   --   --   --   ALT 23  --   --   --   --   --   --   --   ALKPHOS 37*  --   --   --   --   --   --   --   BILITOT 1.2  --   --   --   --   --   --   --   ALBUMIN 5.0  --   --   --   --   --   --  3.7  LATICACIDVEN  --   --  1.7 1.9  --   --  1.5  --   HGBA1C  --   --   --   --  5.2  --   --   --   MG 2.0  --   --   --   --  2.0  --  2.0  PHOS  --   --   --   --   --  4.5  --  2.5  CALCIUM 10.8*  --   --   --   --  9.6  --  8.4*      Total Time in preparing paper work, data evaluation and todays exam - 35 minutes  Signature  -    Lynnwood Sauer M.D on  07/24/2023 at 9:50 AM   -  To page go to [email or web address]

## 2023-07-24 NOTE — Plan of Care (Signed)

## 2023-07-25 ENCOUNTER — Encounter: Payer: Self-pay | Admitting: Gastroenterology

## 2023-07-25 DIAGNOSIS — R110 Nausea: Secondary | ICD-10-CM

## 2023-07-25 MED ORDER — PROMETHAZINE HCL 12.5 MG PO TABS: 12.5000 mg | ORAL_TABLET | Freq: Three times a day (TID) | ORAL | 0 refills | Status: DC | PRN

## 2023-07-25 NOTE — Telephone Encounter (Signed)
 Inbound call from stating she was released from the ER last night due to bowel obstruction. Patient is requesting a call back. Please advise

## 2023-07-26 DIAGNOSIS — R339 Retention of urine, unspecified: Secondary | ICD-10-CM | POA: Diagnosis not present

## 2023-07-30 NOTE — Telephone Encounter (Signed)
 Left message for pt to call back

## 2023-07-30 NOTE — Telephone Encounter (Signed)
 Upon further review the pt was discharged from the hospital 5 days ago.

## 2023-07-31 DIAGNOSIS — K56609 Unspecified intestinal obstruction, unspecified as to partial versus complete obstruction: Secondary | ICD-10-CM | POA: Diagnosis not present

## 2023-07-31 DIAGNOSIS — E876 Hypokalemia: Secondary | ICD-10-CM | POA: Diagnosis not present

## 2023-07-31 DIAGNOSIS — E1169 Type 2 diabetes mellitus with other specified complication: Secondary | ICD-10-CM | POA: Diagnosis not present

## 2023-08-06 DIAGNOSIS — F3181 Bipolar II disorder: Secondary | ICD-10-CM | POA: Diagnosis not present

## 2023-08-06 DIAGNOSIS — F4312 Post-traumatic stress disorder, chronic: Secondary | ICD-10-CM | POA: Diagnosis not present

## 2023-08-06 DIAGNOSIS — F5101 Primary insomnia: Secondary | ICD-10-CM | POA: Diagnosis not present

## 2023-08-06 DIAGNOSIS — F410 Panic disorder [episodic paroxysmal anxiety] without agoraphobia: Secondary | ICD-10-CM | POA: Diagnosis not present

## 2023-08-12 DIAGNOSIS — F4312 Post-traumatic stress disorder, chronic: Secondary | ICD-10-CM | POA: Diagnosis not present

## 2023-08-12 DIAGNOSIS — F410 Panic disorder [episodic paroxysmal anxiety] without agoraphobia: Secondary | ICD-10-CM | POA: Diagnosis not present

## 2023-08-12 DIAGNOSIS — F5101 Primary insomnia: Secondary | ICD-10-CM | POA: Diagnosis not present

## 2023-08-12 DIAGNOSIS — F3181 Bipolar II disorder: Secondary | ICD-10-CM | POA: Diagnosis not present

## 2023-08-29 DIAGNOSIS — R339 Retention of urine, unspecified: Secondary | ICD-10-CM | POA: Diagnosis not present

## 2023-09-03 ENCOUNTER — Other Ambulatory Visit (HOSPITAL_COMMUNITY): Payer: Self-pay

## 2023-09-10 DIAGNOSIS — F3181 Bipolar II disorder: Secondary | ICD-10-CM | POA: Diagnosis not present

## 2023-09-10 DIAGNOSIS — F5101 Primary insomnia: Secondary | ICD-10-CM | POA: Diagnosis not present

## 2023-09-10 DIAGNOSIS — F410 Panic disorder [episodic paroxysmal anxiety] without agoraphobia: Secondary | ICD-10-CM | POA: Diagnosis not present

## 2023-09-10 DIAGNOSIS — F4312 Post-traumatic stress disorder, chronic: Secondary | ICD-10-CM | POA: Diagnosis not present

## 2023-09-11 ENCOUNTER — Encounter: Payer: Self-pay | Admitting: Physician Assistant

## 2023-09-11 ENCOUNTER — Inpatient Hospital Stay: Attending: Physician Assistant

## 2023-09-11 DIAGNOSIS — D509 Iron deficiency anemia, unspecified: Secondary | ICD-10-CM | POA: Insufficient documentation

## 2023-09-11 LAB — CBC WITH DIFFERENTIAL (CANCER CENTER ONLY)
Abs Immature Granulocytes: 0.08 K/uL — ABNORMAL HIGH (ref 0.00–0.07)
Basophils Absolute: 0.1 K/uL (ref 0.0–0.1)
Basophils Relative: 1 %
Eosinophils Absolute: 0.4 K/uL (ref 0.0–0.5)
Eosinophils Relative: 4 %
HCT: 42.4 % (ref 36.0–46.0)
Hemoglobin: 13.5 g/dL (ref 12.0–15.0)
Immature Granulocytes: 1 %
Lymphocytes Relative: 18 %
Lymphs Abs: 1.7 K/uL (ref 0.7–4.0)
MCH: 28 pg (ref 26.0–34.0)
MCHC: 31.8 g/dL (ref 30.0–36.0)
MCV: 88 fL (ref 80.0–100.0)
Monocytes Absolute: 0.6 K/uL (ref 0.1–1.0)
Monocytes Relative: 6 %
Neutro Abs: 6.4 K/uL (ref 1.7–7.7)
Neutrophils Relative %: 70 %
Platelet Count: 388 K/uL (ref 150–400)
RBC: 4.82 MIL/uL (ref 3.87–5.11)
RDW: 13.6 % (ref 11.5–15.5)
WBC Count: 9.2 K/uL (ref 4.0–10.5)
nRBC: 0 % (ref 0.0–0.2)

## 2023-09-11 LAB — IRON AND IRON BINDING CAPACITY (CC-WL,HP ONLY)
Iron: 50 ug/dL (ref 28–170)
Saturation Ratios: 8 % — ABNORMAL LOW (ref 10.4–31.8)
TIBC: 624 ug/dL — ABNORMAL HIGH (ref 250–450)
UIBC: 574 ug/dL — ABNORMAL HIGH (ref 148–442)

## 2023-09-11 LAB — FERRITIN: Ferritin: 28 ng/mL (ref 11–307)

## 2023-09-12 ENCOUNTER — Ambulatory Visit: Payer: Self-pay | Admitting: Physician Assistant

## 2023-09-12 ENCOUNTER — Other Ambulatory Visit: Payer: Self-pay | Admitting: Physician Assistant

## 2023-09-12 DIAGNOSIS — D509 Iron deficiency anemia, unspecified: Secondary | ICD-10-CM

## 2023-09-13 ENCOUNTER — Telehealth (HOSPITAL_COMMUNITY): Payer: Self-pay | Admitting: Pharmacy Technician

## 2023-09-13 DIAGNOSIS — E1169 Type 2 diabetes mellitus with other specified complication: Secondary | ICD-10-CM | POA: Diagnosis not present

## 2023-09-13 DIAGNOSIS — E039 Hypothyroidism, unspecified: Secondary | ICD-10-CM | POA: Diagnosis not present

## 2023-09-13 DIAGNOSIS — E559 Vitamin D deficiency, unspecified: Secondary | ICD-10-CM | POA: Diagnosis not present

## 2023-09-13 DIAGNOSIS — I10 Essential (primary) hypertension: Secondary | ICD-10-CM | POA: Diagnosis not present

## 2023-09-13 DIAGNOSIS — G8929 Other chronic pain: Secondary | ICD-10-CM | POA: Diagnosis not present

## 2023-09-13 DIAGNOSIS — M542 Cervicalgia: Secondary | ICD-10-CM | POA: Diagnosis not present

## 2023-09-13 DIAGNOSIS — D649 Anemia, unspecified: Secondary | ICD-10-CM | POA: Diagnosis not present

## 2023-09-13 NOTE — Telephone Encounter (Signed)
 Auth Submission: NO AUTH NEEDED Site of care: Site of care: [facility] Payer: [employer] [place]  Medication & CPT/J Code(s) submitted: Monoferric  (Ferrci derisomaltose) [phone] Diagnosis Code: D50.0 Route of submission (phone, fax, portal): portal Phone # Fax # Auth type: Buy/Bill PB Units/visits requested: 1000MG  x 1 dose Reference number: [id number] Approval from: 09/13/23 to 03/04/24        Dagoberto Armour, CPhT [facility] [phone]

## 2023-09-19 ENCOUNTER — Ambulatory Visit (INDEPENDENT_AMBULATORY_CARE_PROVIDER_SITE_OTHER)

## 2023-09-19 VITALS — BP 161/104 | HR 94 | Temp 98.4°F | Resp 20 | Ht 66.0 in | Wt 234.8 lb

## 2023-09-19 DIAGNOSIS — D500 Iron deficiency anemia secondary to blood loss (chronic): Secondary | ICD-10-CM

## 2023-09-19 DIAGNOSIS — K31819 Angiodysplasia of stomach and duodenum without bleeding: Secondary | ICD-10-CM | POA: Diagnosis not present

## 2023-09-19 DIAGNOSIS — E611 Iron deficiency: Secondary | ICD-10-CM

## 2023-09-19 MED ORDER — SODIUM CHLORIDE 0.9 % IV SOLN
1000.0000 mg | Freq: Once | INTRAVENOUS | Status: AC
  Administered 2023-09-19: 1000 mg via INTRAVENOUS
  Filled 2023-09-19: qty 10

## 2023-09-19 NOTE — Progress Notes (Signed)
 Diagnosis: Iron  Deficiency Anemia  Provider:  Praveen Mannam MD  Procedure: IV Infusion  IV Type: Peripheral, IV Location: L Antecubital  Monoferric  (Ferric Derisomaltose ), Dose: 1000 mg  Infusion Start Time: 1417  Infusion Stop Time: 1442  Post Infusion IV Care: Patient declined observation and Peripheral IV Discontinued  Discharge: Condition: Good, Destination: Home . AVS Declined  Performed by:  Leita FORBES Miles, LPN

## 2023-09-20 DIAGNOSIS — R509 Fever, unspecified: Secondary | ICD-10-CM | POA: Diagnosis not present

## 2023-09-24 ENCOUNTER — Encounter: Payer: Self-pay | Admitting: Gastroenterology

## 2023-09-29 ENCOUNTER — Encounter: Payer: Self-pay | Admitting: Gastroenterology

## 2023-09-29 ENCOUNTER — Encounter: Payer: Self-pay | Admitting: Physician Assistant

## 2023-10-01 DIAGNOSIS — R339 Retention of urine, unspecified: Secondary | ICD-10-CM | POA: Diagnosis not present

## 2023-10-04 ENCOUNTER — Encounter: Payer: Self-pay | Admitting: Gastroenterology

## 2023-10-04 DIAGNOSIS — Z1331 Encounter for screening for depression: Secondary | ICD-10-CM | POA: Diagnosis not present

## 2023-10-04 DIAGNOSIS — Z1339 Encounter for screening examination for other mental health and behavioral disorders: Secondary | ICD-10-CM | POA: Diagnosis not present

## 2023-10-04 DIAGNOSIS — I10 Essential (primary) hypertension: Secondary | ICD-10-CM | POA: Diagnosis not present

## 2023-10-04 DIAGNOSIS — Z0000 Encounter for general adult medical examination without abnormal findings: Secondary | ICD-10-CM | POA: Diagnosis not present

## 2023-10-04 DIAGNOSIS — E1169 Type 2 diabetes mellitus with other specified complication: Secondary | ICD-10-CM | POA: Diagnosis not present

## 2023-10-07 ENCOUNTER — Ambulatory Visit (INDEPENDENT_AMBULATORY_CARE_PROVIDER_SITE_OTHER): Admitting: Gastroenterology

## 2023-10-07 ENCOUNTER — Encounter: Payer: Self-pay | Admitting: Gastroenterology

## 2023-10-07 ENCOUNTER — Other Ambulatory Visit (INDEPENDENT_AMBULATORY_CARE_PROVIDER_SITE_OTHER)

## 2023-10-07 ENCOUNTER — Ambulatory Visit (INDEPENDENT_AMBULATORY_CARE_PROVIDER_SITE_OTHER)
Admission: RE | Admit: 2023-10-07 | Discharge: 2023-10-07 | Disposition: A | Discharge status: Home / Self Care | Source: Ambulatory Visit | Attending: Gastroenterology | Admitting: Gastroenterology

## 2023-10-07 VITALS — BP 160/80 | HR 117 | Ht 66.0 in | Wt 239.0 lb

## 2023-10-07 DIAGNOSIS — R1030 Lower abdominal pain, unspecified: Secondary | ICD-10-CM

## 2023-10-07 DIAGNOSIS — K648 Other hemorrhoids: Secondary | ICD-10-CM

## 2023-10-07 DIAGNOSIS — K625 Hemorrhage of anus and rectum: Secondary | ICD-10-CM

## 2023-10-07 DIAGNOSIS — K529 Noninfective gastroenteritis and colitis, unspecified: Secondary | ICD-10-CM

## 2023-10-07 DIAGNOSIS — R109 Unspecified abdominal pain: Secondary | ICD-10-CM | POA: Diagnosis not present

## 2023-10-07 DIAGNOSIS — Z8719 Personal history of other diseases of the digestive system: Secondary | ICD-10-CM

## 2023-10-07 DIAGNOSIS — N83201 Unspecified ovarian cyst, right side: Secondary | ICD-10-CM

## 2023-10-07 DIAGNOSIS — D509 Iron deficiency anemia, unspecified: Secondary | ICD-10-CM

## 2023-10-07 LAB — CBC WITH DIFFERENTIAL/PLATELET
Basophils Absolute: 0.1 K/uL (ref 0.0–0.1)
Basophils Relative: 1.1 % (ref 0.0–3.0)
Eosinophils Absolute: 0.2 K/uL (ref 0.0–0.7)
Eosinophils Relative: 2.3 % (ref 0.0–5.0)
HCT: 40.7 % (ref 36.0–46.0)
Hemoglobin: 13.2 g/dL (ref 12.0–15.0)
Lymphocytes Relative: 19.7 % (ref 12.0–46.0)
Lymphs Abs: 1.7 K/uL (ref 0.7–4.0)
MCHC: 32.5 g/dL (ref 30.0–36.0)
MCV: 86.9 fl (ref 78.0–100.0)
Monocytes Absolute: 0.5 K/uL (ref 0.1–1.0)
Monocytes Relative: 6.2 % (ref 3.0–12.0)
Neutro Abs: 6.1 K/uL (ref 1.4–7.7)
Neutrophils Relative %: 70.7 % (ref 43.0–77.0)
Platelets: 367 K/uL (ref 150.0–400.0)
RBC: 4.69 Mil/uL (ref 3.87–5.11)
RDW: 15.4 % (ref 11.5–15.5)
WBC: 8.6 K/uL (ref 4.0–10.5)

## 2023-10-07 LAB — COMPREHENSIVE METABOLIC PANEL WITH GFR
ALT: 20 U/L (ref 0–35)
AST: 17 U/L (ref 0–37)
Albumin: 4.8 g/dL (ref 3.5–5.2)
Alkaline Phosphatase: 38 U/L — ABNORMAL LOW (ref 39–117)
BUN: 14 mg/dL (ref 6–23)
CO2: 22 meq/L (ref 19–32)
Calcium: 9.7 mg/dL (ref 8.4–10.5)
Chloride: 103 meq/L (ref 96–112)
Creatinine, Ser: 0.74 mg/dL (ref 0.40–1.20)
GFR: 98.63 mL/min (ref >60.00)
Glucose, Bld: 127 mg/dL — ABNORMAL HIGH (ref 70–99)
Potassium: 4.3 meq/L (ref 3.5–5.1)
Sodium: 136 meq/L (ref 135–145)
Total Bilirubin: 0.2 mg/dL (ref 0.2–1.2)
Total Protein: 7.5 g/dL (ref 6.0–8.3)

## 2023-10-07 LAB — LIPASE: Lipase: 59 U/L (ref 11.0–59.0)

## 2023-10-07 MED ORDER — HYDROCORTISONE ACETATE 25 MG RE SUPP: 25.0000 mg | Freq: Every evening | RECTAL | 0 refills | Status: DC

## 2023-10-07 NOTE — Patient Instructions (Signed)
 Your provider has requested that you go to the basement level for lab work before leaving today. Press B on the elevator. The lab is located at the first door on the left as you exit the elevator.  Your provider has requested that you have an abdominal x ray before leaving today. Please go to the basement floor to our Radiology department for the test.  _______________________________________________________  If your blood pressure at your visit was 140/90 or greater, please contact your primary care physician to follow up on this.  _______________________________________________________  If you are age 44 or older, your body mass index should be between 23-30. Your Body mass index is 38.58 kg/m. If this is out of the aforementioned range listed, please consider follow up with your Primary Care Provider.  If you are age 4 or younger, your body mass index should be between 19-25. Your Body mass index is 38.58 kg/m. If this is out of the aformentioned range listed, please consider follow up with your Primary Care Provider.   ________________________________________________________  The [facility] providers would like to encourage you to use MYCHART to communicate with providers for non-urgent requests or questions.  Due to long hold times on the telephone, sending your provider a message by [facility] may be a faster and more efficient way to get a response.  Please allow 48 business hours for a response.  Please remember that this is for non-urgent requests.  _______________________________________________________  [facility] is using a team-based approach to care.  Your team is made up of your doctor and two to three APPS. Our APPS (Nurse Practitioners and Physician Assistants) work with your physician to ensure care continuity for you. They are fully qualified to address your health concerns and develop a treatment plan. They communicate directly with your gastroenterologist to  care for you. Seeing the Advanced Practice Practitioners on your physician's team can help you by facilitating care more promptly, often allowing for earlier appointments, access to diagnostic testing, procedures, and other specialty referrals.   Thank you for trusting me with your gastrointestinal care. Deanna May, NP-C

## 2023-10-07 NOTE — Progress Notes (Signed)
 Chief Complaint: abdominal pain, worsening diarrhea, rectal bleeding Primary GI Doctor:Dr. Leigh  HPI: 43 year old female here for follow-up visit.  Recall us  seeing her for iron  deficiency anemia secondary to GAVE, she is also had issues with chronic diarrhea in the past.  Patient last seen in GI office by Dr. Leigh on 04/18/23 for abdominal pain.  Recall she has a complicated medical history to include bipolar disorder, chronic abdominal pain, pelvic pain, fibromyalgia, hx of IC s/p total cystectomy with [place]  pouch. She is s/p hysterectomy, bowel resection when she had a complication from her [place]  pouch.  She was on chronic narcotics for several years for chronic pain, has had bilateral celiac plexus blocks in June 2016 and had a neurostimulator in place, which has since been removed. She had eventually come off chronic narcotics in August 2021.     She has had an extensive workup as outlined in prior notes for her bowels.  Her workup has been negative.  She has been tried on a variety of regimens for her symptoms to include Flagyl , rifaximin , Colestid , cholestyramine , Imodium , Lomotil , dicyclomine , alosetron .   Recall she has undergone extensive evaluation for her iron  deficiency in the past.  More recently she had a capsule endoscopy done last Judas Mohammad which showed that her small bowel is normal but she had a lot of adherent heme in her stomach.  We did an EGD at the hospital in [place] of last year and she was found to have suspected GAVE.  APC was applied.  She has had a really nice result with that.  Her hemoglobin and iron  studies have improved.  Hemoglobin went from 10-14.8.  Her iron  studies look better.  She is doing well with this.  Recall historically as part of her chronic pain regimen she has been on Elavil, Cymbalta, Lyrica, gabapentin  in the past none of which have helped.  She has fortunately been off all narcotics      --07/22/23 patient presented to ED with nausea,  vomiting, and abdominal pain for 1 day. She has had multiple abdominal surgeries in the past however she is also on Ozempic which likely is the culprit, she took Ozempic last on Monday, with supportive care she is now symptom-free having bowel movements, placed on soft diet, tolerating it well cleared by general surgery for discharge. Will be discharged requested her to stop taking Ozempic, follow-up with PCP in 2 to 3 days.    Interval History   Patient presents for evaluation of acute lower abdominal/pelvic pain that started last Monday along with worsening diarrhea and rectal bleeding.  Patient reports she has had history of chronic abdominal pain however this episode was different.  Patient states after she went to the restroom there was a pool of bright red blood in the toilet as well as clots with wiping.  She reports the last episode of bleeding was Saturday.  Denies any dark tarry or maroon stools.  Patient states the pain over the last 24 hours has improved some.  Patient is currently taking dicyclomine  10 mg twice daily and over-the-counter IBgard which she states did not help with this current episode.  She also tells me she has had chronic diarrhea for about 4 years.  Patient will average anywhere from 7-12 loose stools per day.  Patient reports over the last few weeks she has been waking up at night with diarrhea which is not normal for her.  No new medications. No exposure. She reports in June she traveled to  [place]  but they drove there. She reports the end of trip she felt her diarrhea had increased. No nausea or vomiting.   She is being seen by hematology for iron  deficiency and recently had 1 Monoferric  1000mg  dose on 09/19/23.  Patient reports she has chronic fatigue  Patient stopped Ozempic per ED recommendations in [place] after sbo. She reports it was not replaced with another diabetes medication.   Important to mentions she also was referred to reconstructive surgeon at [facility] per Dr.  Lamar Sar, but they would not take her case. Dr. Lamar Sar is training a new specialist and agreed to continue to monitor her case.   Prior GI workup: EGD 01/15/2017 - LA grade A esophagitis, inflammatory nodule at GEJ, normal esophagus otherwise, normal stomach, normal small bowel.    Colonoscopy January 2019 showed the following:   - Patent end-to-end ileo-colonic anastomosis, characterized by healthy appearing mucosa. - The examined portion of the ileum was normal. - One 3 mm polyp in the transverse colon, removed with a cold biopsy forceps. Resected and retrieved. - The examination was otherwise normal on direct and retroflexion views.   Breath test indeterminate TTG negative Fecal elastase Fecal calprotectin 43 C diff negative   EGD 01/20/21 -  - LA Grade A esophagitis was found 40 cm from the incisors. There was some nodular tissue at the GEJ in this area, suspect inflammatory changes. Biopsies were taken with a cold forceps for histology. - The exam of the esophagus was otherwise normal. - Patchy mild inflammation characterized by adherent blood and friability was found in the gastric antrum. - The exam of the stomach was otherwise normal. - Biopsies were taken with a cold forceps in the gastric body, at the incisura and in the gastric antrum for Helicobacter pylori testing. - The duodenal bulb and second portion of the duodenum were normal. Biopsies for histology were taken with a cold forceps for evaluation of celiac disease.   Colonoscopy 01/20/21 , recall 7 years - The perianal and digital rectal examinations were normal. - The terminal ileum appeared normal. - There was evidence of a prior end-to-side ileo-colonic anastomosis in the ascending colon. This was patent and was characterized by healthy appearing mucosa. - A few medium-mouthed diverticula were found in the right colon. - A 3 mm polyp was found in the transverse colon. The polyp was sessile. The  polyp was removed with a cold biopsy forceps. Resection and retrieval were complete. I thought a photo of it was taken, but it was not. It was benign appearing. - Internal hemorrhoids were found during retroflexion. - The exam was otherwise without abnormality. - Biopsies for histology were taken with a cold forceps from the right colon, left colon and transverse colon for evaluation of microscopic colitis.   1. Surgical [P], duodenum - PEPTIC DUODENITIS - NO DYSPLASIA OR MALIGNANCY IDENTIFIED 2. Surgical [P], gastric antrum and gastric body - BENIGN GASTRIC MUCOSA WITH MILD REACTIVE CHANGES - NO H. PYLORI, INTESTINAL METAPLASIA OR MALIGNANCY IDENTIFIED 3. Surgical [P], GE junction, polyp (1) - HYPERPLASTIC GASTRIC POLYP(S) - NO H. PYLORI, INTESTINAL METAPLASIA OR MALIGNANCY IDENTIFIED 4. Surgical [P], random colon sites - BENIGN COLONIC MUCOSA - NO ACTIVE INFLAMMATION OR EVIDENCE OF MICROSCOPIC COLITIS - NO HIGH-GRADE DYSPLASIA OR MALIGNANCY IDENTIFIED 5. Surgical [P], colon, transverse, polyp (1) - TUBULAR ADENOMA (1 OF 1 FRAGMENTS) - NO HIGH-GRADE DYSPLASIA OR MALIGNANCY IDENTIFIED     MRE 04/17/21: IMPRESSION: 1. No evidence of acute or significant chronic bowel inflammation.  2. Stable postsurgical changes of prior cystectomy with [place]  pouch formation and right-sided periumbilical cystostomy formation. 3. Arterially enhancing 14 mm segment VI hepatic focus which equilibrates to background liver on all additional postcontrast sequences and does not demonstrate abnormal intrinsic T1 or T2 signal. No evidence of hepatic cirrhosis. Nonspecific imaging characteristics but almost certainly reflecting a benign lesion such as focal nodular hyperplasia or hepatic adenoma. Consider follow-up MRI with and without EOVIST  contrast in 6 for more definitive characterization and assessment of stability. 4. Mild diffuse hepatic steatosis. 5. Horseshoe kidney.   MRI abdomen  09/08/21: IMPRESSION: 1. Hepatomegaly and hepatic steatosis. 2. No hepatic lesions identified. 3. Horseshoe kidney.     Capsule endoscopy 07/2022: Small intestine is normal without any concerning pathology there. However, she does have irritated mucosa in her stomach which is quite friable with adherent heme.  She had some gastritis noted on EGD a few years ago.  I suspect this is the likely cause of her iron  deficiency.  Unclear if this represents a GAVE or something else but it Jacoba Cherney be amenable to APC at the hospital.   EGD 07/25/22: - Esophagogastric landmarks identified. - 1 cm hiatal hernia. - Ectopic gastric mucosa in the upper third of the esophagus. - Normal esophagus otherwise - Suspected mild gastric antral vascular ectasia - quite friable, suspect the source of iron  deficiency. Treated with argon plasma coagulation (APC). - Normal examined duodenum.  04/23/23 CTAP IMPRESSION: 1. No acute abnormality in the abdomen or pelvis. 2. Marked diffuse hepatic steatosis. 3. Hyperdense/hyperenhancing nodular focus measuring 2.4 cm in the right hepatic lobe, nonspecific but possibly reflecting focal fatty sparing. Consider further evaluation with nonemergent hepatic protocol MRI with and without contrast. 4. Horseshoe renal morphology. 5. Septated 4.9 cm right ovarian cyst. Suggest further evaluation by pelvic ultrasound. 6. Trace pelvic free fluid, likely physiologic.  05/08/23 MR liver IMPRESSION: 1. In the anterior right lobe of the liver, hepatic segment V, there is an intrinsically T1 hyperintense, T2 hypointense lobulated lesion measuring 3.0 x 2.0 cm. On multiphasic contrast enhanced sequences this demonstrates early arterial phase hyperenhancement which rapidly fades to parenchymal background on remaining multiphasic contrast enhanced sequences. There are multiple additional lesions scattered throughout the liver with similar signal and enhancement characteristics measuring 1.0 cm  and smaller. All of these lesions appear to be new compared to prior examination dated 09/07/2021 except for lesion in hepatic segment III. Imaging characteristics are most consistent with benign focal nodular hyperplasias or alternately hepatic adenoma in this patient demographic. Metastatic disease generally not favored in the absence of known malignancy although not excluded. Although primary differential considerations are benign, there Tanesha Arambula be management and surveillance implications for the diagnosis of hepatic adenoma. Recommend hepatobiliary referral and follow-up Eovist  enhanced MRI in 6 months to ensure stability and more confidently distinguish between these two entities. Please specify Eovist  contrast at the time of ordering to ensure its proper use. 2. Hepatomegaly and severe hepatic steatosis. 3. Partial horseshoe kidney.  05/16/23 labs show: AFP 2.4  05/08/23 US  pelvic U/S IMPRESSION: 1. Right ovarian cyst with thin internal septations measuring up to 4.9 cm. Recommend follow-up pelvic ultrasound in 3-6 months. 2. Status post hysterectomy.  07/10/2023 US  pelvic U/S IMPRESSION: 1. Status post hysterectomy. 2. Nonvisualization of the ovaries. 3. Previously described cyst not visualized on current exam.  07/22/23 CTAP IMPRESSION: 1. Findings compatible with small-bowel obstruction. Transition point not definitely seen, but likely in the mid abdomen. 2. Horseshoe kidney. 3. Diverting ileostomy appears unchanged  and uncomplicated.  Wt Readings from Last 3 Encounters:  10/07/23 239 lb (108.4 kg)  09/19/23 234 lb 12.8 oz (106.5 kg)  07/22/23 220 lb (99.8 kg)    Past Medical History:  Diagnosis Date   ADHD (attention deficit hyperactivity disorder)    Allergy    Anemia    Anxiety    Arthritis, lumbar spine    Bipolar disorder (HCC)    Chronic back pain    Chronic headaches    Chronic interstitial cystitis    removed my bladder when I went to end stage IC    Depression    Disc degeneration, lumbar 01/10/2022   Fibromyalgia    GERD (gastroesophageal reflux disease)    Gestational diabetes    resolved   History of blood transfusion 02/2010   related to OR   History of kidney stones    HLD (hyperlipidemia)    Hypothyroid    IBS (irritable colon syndrome)    IDA (iron  deficiency anemia)    Interstitial cystitis 06/2008   Dr. Janit Meeter [facility]   Polycystic ovarian disease    Preeclampsia 2009   Self-catheterizes urinary bladder    I have an [place]  pouch; made a bladder using part of my small intestines; cath out of stoma in my belly button (08/02/2015)   Small bowel obstruction (HCC) 08/03/2015   Spina bifida (HCC) 01/10/2022   Thyroid  disease    Vitamin D deficiency     Past Surgical History:  Procedure Laterality Date   ABDOMINAL HYSTERECTOMY  02/2010   left my ovaries   APPENDECTOMY  02/2010   BLADDER REMOVAL  02/2010   and [place]  Pouch   CESAREAN SECTION  2009   CHOLECYSTECTOMY N/A 05/27/2017   Procedure: LAPAROSCOPIC CHOLECYSTECTOMY;  Surgeon: Rubin Calamity, MD;  Location: [facility] OR;  Service: General;  Laterality: N/A;   COLONOSCOPY     ESOPHAGOGASTRODUODENOSCOPY (EGD) WITH PROPOFOL  N/A 07/25/2022   Procedure: ESOPHAGOGASTRODUODENOSCOPY (EGD) WITH PROPOFOL ;  Surgeon: Leigh Elspeth SQUIBB, MD;  Location: [facility] ENDOSCOPY;  Service: Gastroenterology;  Laterality: N/A;   HOT HEMOSTASIS N/A 07/25/2022   Procedure: HOT HEMOSTASIS (ARGON PLASMA COAGULATION/BICAP);  Surgeon: Leigh Elspeth SQUIBB, MD;  Location: [facility] ENDOSCOPY;  Service: Gastroenterology;  Laterality: N/A;   [place]  pouch  01/19/2021   Cystoscopy with bulk amid injection   LAPAROSCOPIC LYSIS OF ADHESIONS N/A 05/27/2017   Procedure: LAPAROSCOPIC LYSIS OF ADHESIONS;  Surgeon: Rubin Calamity, MD;  Location: [facility] OR;  Service: General;  Laterality: N/A;   OTHER SURGICAL HISTORY     remval of urethra when bladder was removed   SMALL INTESTINE SURGERY  2017   for bowel  obstruction   SPINAL CORD STIMULATOR IMPLANT  09/2009; ~ 04/2015   SPINAL CORD STIMULATOR REMOVAL  ~ 04/2015   UPPER GI ENDOSCOPY     WISDOM TOOTH EXTRACTION      Current Outpatient Medications  Medication Sig Dispense Refill   acetaminophen  (TYLENOL ) 650 MG CR tablet Take 1,300 mg by mouth 2 (two) times daily.     amantadine (SYMMETREL) 100 MG capsule Take 100 mg by mouth 2 (two) times daily.     amphetamine -dextroamphetamine  (ADDERALL) 30 MG tablet Take 30 mg by mouth 2 (two) times daily.     AUSTEDO XR 48 MG TB24 Take 1 tablet by mouth every evening.     clonazePAM  (KLONOPIN ) 2 MG tablet Take 1 mg by mouth 3 (three) times daily as needed for anxiety.     D3-50 1.25 MG (50000 UT) capsule Take 50,000  Units by mouth 3 (three) times a week. Take one capsule by mouth on Tuesdays, Thursdays and Saturdays per patient     dicyclomine  (BENTYL ) 10 MG capsule TAKE 1 CAPSULE BY MOUTH TWICE A DAY 180 capsule 1   fenofibrate 160 MG tablet Take 160 mg by mouth at bedtime.     hydrocortisone  (ANUSOL -HC) 25 MG suppository Place 1 suppository (25 mg total) rectally at bedtime. 10 suppository 0   lamoTRIgine (LAMICTAL) 200 MG tablet Take 400 mg by mouth at bedtime.     levonorgestrel-ethinyl estradiol (SEASONALE,INTROVALE,JOLESSA) 0.15-0.03 MG tablet Take 1 tablet by mouth in the morning.     levothyroxine  (SYNTHROID ) 125 MCG tablet Take 125 mcg by mouth daily before breakfast.     lithium  carbonate (ESKALITH) 450 MG ER tablet Take 675 mg by mouth at bedtime.     metoprolol  succinate (TOPROL -XL) 100 MG 24 hr tablet Take 100 mg by mouth at bedtime.     nitrofurantoin (MACRODANTIN) 50 MG capsule Take 50 mg by mouth at bedtime.     omeprazole  (PRILOSEC) 40 MG capsule Take 1 capsule (40 mg total) by mouth daily.     ondansetron  (ZOFRAN ) 4 MG tablet Take 0.5-1 tablets (2-4 mg total) by mouth every 8 (eight) hours as needed for vomiting or nausea. 20 tablet 0   ramelteon (ROZEREM) 8 MG tablet Take 1 tablet by mouth  at bedtime.     rOPINIRole (REQUIP) 3 MG tablet Take 3 mg by mouth at bedtime.     rosuvastatin (CRESTOR) 5 MG tablet Take 1 tablet by mouth at bedtime.     Safety Seal Miscellaneous MISC Apply 1 Application topically in the morning. Medication Name: Hormonic Hair Solution (Minoxidil 8%, Finasteride 0.05%) 30 g 6   telmisartan (MICARDIS) 40 MG tablet Take 40 mg by mouth at bedtime.     tiZANidine (ZANAFLEX) 4 MG tablet Take 8 mg by mouth 2 (two) times daily.     diphenoxylate -atropine  (LOMOTIL ) 2.5-0.025 MG tablet Take 1 tablet by mouth 4 (four) times daily as needed for diarrhea or loose stools (IBS). (Patient not taking: Reported on 10/07/2023) 120 tablet 2   levofloxacin  (LEVAQUIN ) 500 MG tablet Take 1 tablet (500 mg total) by mouth daily. 2 tablet 0   nystatin cream (MYCOSTATIN) Apply 1 Application topically daily as needed (vaginal itching).     promethazine  (PHENERGAN ) 12.5 MG tablet Take 1 tablet (12.5 mg total) by mouth every 8 (eight) hours as needed for nausea or vomiting. 30 tablet 0   triamcinolone cream (KENALOG) 0.1 % Apply 1 Application topically daily as needed (itching).     Current Facility-Administered Medications  Medication Dose Route Frequency Provider Last Rate Last Admin   acetaminophen  (TYLENOL ) tablet 650 mg  650 mg Oral Once Thayil, Irene T, PA-C       diphenhydrAMINE  (BENADRYL ) capsule 25 mg  25 mg Oral Once Thayil, Irene T, PA-C        Allergies as of 10/07/2023 - Review Complete 10/07/2023  Allergen Reaction Noted   Keflex [cephalexin] Hives 02/16/2014   Penicillins Hives and Other (See Comments) 02/16/2014   Sulfa antibiotics Hives 02/16/2014    Family History  Problem Relation Age of Onset   Lung cancer Mother        smoker   Other Father        benigh tumor between heart and lungs, had surgery and something went wrong, he died at 84   Diabetes Paternal Grandmother    Heart disease Paternal Grandmother  Diabetes Paternal Grandfather    Heart disease  Paternal Grandfather    Pancreatic cancer Paternal Grandfather    Liver cancer Paternal Grandfather    Breast cancer Maternal Aunt    Colon cancer Neg Hx    Esophageal cancer Neg Hx    Prostate cancer Neg Hx    Rectal cancer Neg Hx    Stomach cancer Neg Hx     Review of Systems:    Constitutional: No weight loss, fever, chills, weakness or fatigue HEENT: Eyes: No change in vision               Ears, Nose, Throat:  No change in hearing or congestion Skin: No rash or itching Cardiovascular: No chest pain, chest pressure or palpitations   Respiratory: No SOB or cough Gastrointestinal: See HPI and otherwise negative Genitourinary: No dysuria or change in urinary frequency Neurological: No headache, dizziness or syncope Musculoskeletal: No new muscle or joint pain Hematologic: No bleeding or bruising Psychiatric: No history of depression or anxiety    Physical Exam:  Vital signs: BP (!) 160/80   Pulse (!) 117   Ht 5' 6 (1.676 m)   Wt 239 lb (108.4 kg)   BMI 38.58 kg/m   Constitutional:   Pleasant female appears to be in NAD, Well developed, Well nourished, alert and cooperative Throat: Oral cavity and pharynx without inflammation, swelling or lesion.  Respiratory: Respirations even and unlabored. Lungs clear to auscultation bilaterally.   No wheezes, crackles, or rhonchi.  Cardiovascular: Normal S1, S2. Regular rate and rhythm. No peripheral edema, cyanosis or pallor.  Gastrointestinal:  Soft, nondistended, no tender. No rebound or guarding. Hypoactive bowel sounds. No appreciable masses or hepatomegaly. Rectal:  Not performed.  Msk:  Symmetrical without gross deformities. Without edema, no deformity or joint abnormality.  Neurologic:  Alert and  oriented x4;  grossly normal neurologically.  Skin:   Dry and intact without significant lesions or rashes.  RELEVANT LABS AND IMAGING: CBC    Latest Ref Rng & Units 10/07/2023   10:55 AM 09/11/2023    9:03 AM 07/24/2023    3:58 AM   CBC  WBC 4.0 - 10.5 K/uL 8.6  9.2  5.6   Hemoglobin 12.0 - 15.0 g/dL 86.7  86.4  85.6   Hematocrit 36.0 - 46.0 % 40.7  42.4  44.1   Platelets 150.0 - 400.0 K/uL 367.0  388  435      CMP     Latest Ref Rng & Units 10/07/2023   10:55 AM 07/24/2023    3:58 AM 07/23/2023    5:53 AM  CMP  Glucose 70 - 99 mg/dL 872  871  843   BUN 6 - 23 mg/dL 14  22  27    Creatinine 0.40 - 1.20 mg/dL 9.25  9.00  8.54   Sodium 135 - 145 mEq/L 136  135  135   Potassium 3.5 - 5.1 mEq/L 4.3  3.4  3.9   Chloride 96 - 112 mEq/L 103  103  105   CO2 19 - 32 mEq/L 22  18  14    Calcium 8.4 - 10.5 mg/dL 9.7  8.4  9.6   Total Protein 6.0 - 8.3 g/dL 7.5     Total Bilirubin 0.2 - 1.2 mg/dL 0.2     Alkaline Phos 39 - 117 U/L 38     AST 0 - 37 U/L 17     ALT 0 - 35 U/L 20  Lab Results  Component Value Date   IRON  50 09/11/2023   TIBC 624 (H) 09/11/2023   FERRITIN 28 09/11/2023  04/18/23 hepatic function normal 05/16/23 labs show: AFP 2.4 Assessment: Encounter Diagnoses  Name Primary?   Lower abdominal pain Yes   Chronic diarrhea    Rectal bleeding    Iron  deficiency anemia, unspecified iron  deficiency anemia type    Internal hemorrhoids    History of small bowel obstruction    Cyst of right ovary        43 year old female patient who presents with lower abdominal/pelvic pain. She has had multiple images over the course of the last 5 mths. Most recent CTAP in [place] that revealed SBO thought to be caused by patient being on Ozempic.  Patient has since then discontinued Ozempic.  Patient presents today with worsening diarrhea, abdominal pain, and intermittent rectal bleeding.  She reports she recently traveled to [place]  and when she returned her symptoms slowly worsened.  Will go ahead and recheck labs CBC, CMP and lipase.  Will also collect stool test to see if she acquired enteric infection when traveling.  Would also like to get abdominal x-ray to rule out obstruction as she has hypoactive bowel sounds on  exam today.  I suspect the intermittent rectal bleeding is due to increased stool count and internal hemorrhoids noted on her last colonoscopy.  Will go ahead and send in Anusol  suppositories to use at bedtime if symptoms return.  We also discussed patient having neurosurgeon on board in case we encounter any more issues with bowel obstructions.  She will continue care under Dr. Lamar Sar and his colleague.    Patient also recently had CT scan and MRI that showed a few lesions in her liver that overall favor FNH or perhaps adenomas.  FNP level was normal.  Will order repeat MRI due in September with Eovist  to clarify.  For history of MASLD, last hepatic function tests were normal with recommendations to repeat in 1 year.    Her hemoglobin is stable at 13.5.  Patient recently had decrease in iron  levels and received IV iron  infusion with Hematology. Unable to tolerate oral iron  due to GI intolerance. Hgb 14.3>13.5. Iron  88>50, Ferritin 63>28. She has noted intermittent bright red blood in stools will recheck her CBC today.  Patient with history of GAVE s/p APC and currently on omeprazole  milligrams p.o. daily. Treat hemorrhoids. It was also noted on CT scan ordered by Dr. Leigh patient had right ovarian cyst.  Follow-up pelvic ultrasound revealed ovarian cyst on the right side approximately 5 cm inside.  Recommendations to follow-up with GYN.  Patient did have follow-up pelvic ultrasound in [place] that revealed previously described cyst not visualized on current exam.    Plan: - Recheck CBC, CMP, lipase - GI profile stool, cdiff PCR - Order Abdominal Xray 2 view  - Continue omeprazole  40 mg po daily  -give RX for Anusol  suppository prn hemorrhoids  - repeat MRI in 6 months with Eovist  to clarify if this is FNH or hepatic adenoma, Dottie has reminder per EPIC  (11/2023) -Repeat LFTs 2/26 -defer procedures to Dr. Leigh  Thank you for the courtesy of this consult. Please call me with any  questions or concerns.   Lucerne Paone, FNP-C [facility] 10/07/2023, 12:56 PM  Cc: Avva, Ravisankar, MD

## 2023-10-07 NOTE — Progress Notes (Signed)
 Agree with assessment and plan as outlined.

## 2023-10-08 ENCOUNTER — Other Ambulatory Visit

## 2023-10-08 DIAGNOSIS — R6259 Other lack of expected normal physiological development in childhood: Secondary | ICD-10-CM | POA: Diagnosis not present

## 2023-10-08 DIAGNOSIS — D509 Iron deficiency anemia, unspecified: Secondary | ICD-10-CM | POA: Diagnosis not present

## 2023-10-08 DIAGNOSIS — R1030 Lower abdominal pain, unspecified: Secondary | ICD-10-CM

## 2023-10-08 DIAGNOSIS — K529 Noninfective gastroenteritis and colitis, unspecified: Secondary | ICD-10-CM

## 2023-10-08 DIAGNOSIS — A09 Infectious gastroenteritis and colitis, unspecified: Secondary | ICD-10-CM | POA: Diagnosis not present

## 2023-10-08 DIAGNOSIS — R197 Diarrhea, unspecified: Secondary | ICD-10-CM | POA: Diagnosis not present

## 2023-10-08 DIAGNOSIS — K648 Other hemorrhoids: Secondary | ICD-10-CM

## 2023-10-08 DIAGNOSIS — K625 Hemorrhage of anus and rectum: Secondary | ICD-10-CM | POA: Diagnosis not present

## 2023-10-08 DIAGNOSIS — R252 Cramp and spasm: Secondary | ICD-10-CM | POA: Diagnosis not present

## 2023-10-09 ENCOUNTER — Ambulatory Visit: Payer: Self-pay | Admitting: Gastroenterology

## 2023-10-09 LAB — GI PROFILE, STOOL, PCR

## 2023-10-23 ENCOUNTER — Telehealth: Payer: Self-pay | Admitting: Neurology

## 2023-10-23 ENCOUNTER — Ambulatory Visit (INDEPENDENT_AMBULATORY_CARE_PROVIDER_SITE_OTHER): Admitting: Neurology

## 2023-10-23 ENCOUNTER — Telehealth: Payer: Self-pay | Admitting: *Deleted

## 2023-10-23 VITALS — BP 140/86 | HR 83 | Ht 66.0 in | Wt 240.0 lb

## 2023-10-23 DIAGNOSIS — K31819 Angiodysplasia of stomach and duodenum without bleeding: Secondary | ICD-10-CM

## 2023-10-23 DIAGNOSIS — H547 Unspecified visual loss: Secondary | ICD-10-CM | POA: Insufficient documentation

## 2023-10-23 DIAGNOSIS — R208 Other disturbances of skin sensation: Secondary | ICD-10-CM | POA: Diagnosis not present

## 2023-10-23 DIAGNOSIS — Z906 Acquired absence of other parts of urinary tract: Secondary | ICD-10-CM

## 2023-10-23 DIAGNOSIS — D509 Iron deficiency anemia, unspecified: Secondary | ICD-10-CM | POA: Diagnosis not present

## 2023-10-23 DIAGNOSIS — G2401 Drug induced subacute dyskinesia: Secondary | ICD-10-CM | POA: Insufficient documentation

## 2023-10-23 DIAGNOSIS — R932 Abnormal findings on diagnostic imaging of liver and biliary tract: Secondary | ICD-10-CM

## 2023-10-23 DIAGNOSIS — K651 Peritoneal abscess: Secondary | ICD-10-CM

## 2023-10-23 NOTE — Telephone Encounter (Signed)
 Patient has been scheduled for MRI liver with EOVIST  at [facility] (entrance A) on 11/05/23 at 2 pm, 145 pm arrival. NPO 4 hours prior.  I have advised scheduling [place]) that patient did have Monoferric  IV iron  infusion last on 09/19/23. She says this should not interfere with testing as Feraheme  often can.  I have spoken to the patient to advise her of appointment time/date/location/prep and she verbalizes understanding.

## 2023-10-23 NOTE — Progress Notes (Signed)
 SLEEP MEDICINE CLINIC    Provider:  Dedra Gores, MD  Primary Care Physician:  Janey Santos, MD [address] [place] [address]     Referring Provider: Janey Santos, Md [address] [place],  [place] [address]          Chief Complaint according to patient   Patient presents with:     New Patient (Initial Visit)     Non restorative sleep , had been seen here 30 months ago.  Numbness and hand numbness more on the right, some joint pain but no rheumatological  specialist involved.  Right leg busing.  Seeing also little star lights and blurred/ foggy vision. In the right eye has developed  a lower acuity.       HISTORY OF PRESENT ILLNESS:  [name] is a 43 y.o. female Caucasian patient who is seen upon referral on 10/23/2023 from Dr Janey . Chief concern according to patient :    Rm 2, here for new concern that started around June 23,2025. She started to have a weird sensation that started in the right upper shoulder area. The feeling went down the right arm and into the right hand. She described as Numbness/tingling and Pins and needles sensation. More so on the right. She does also have numbness in left hand but not upper arm on left side. States intermittently comes and goes. She sometimes wakes up with it. She had a xray completed of neck which was negative.  Described shooting pain in neck area and this is intermittent. She states from calves down into the feet. Has difficulty with standing or sitting too long. She described as a buzzing feeling and this is noted to be different than the pins and needles sensation. States there is joint pain in hands, knees. She states this is a different feeling from her fibromyalgia symptoms. Currently not established with rheumatologist. Followed by GI, Oncology.   She has a neobladder, chimney cath.  Cousin dx with MS at age 31. DM 2 Her GI doctor is Dr Leigh.  Bowel obstruction,  IBS,  water melon stomach  . Oncology follow , Haematology for iron  deficiency . Iron  infusions.     03-27-2021 : The arousals were noted as: 58 were spontaneous, 0 were associated with PLMs, 0 were associated with respiratory events.        Sleep was very fragmented.  The patient became diaphoretic, her EKG was irregular, see screenshot attached.  Mild Snoring was noted.   IMPRESSION:   1.Neither sleep apnea nor PLMD were present. If this patient has RLS, it is not translated into PLMs. 2.        Abnormal EKG was noted, and diaphoresis at the same time-  3.        Overall poor sleep efficiency, prolonged latency and delayed REM sleep.  4.        Mild snoring in supine- not arousal causing.      RECOMMENDATIONS:  I do not have any recommendations, there was no primary organic sleep disorder identified.  Chronic pain is the most likely cause of sleep fragmentation Please inform Dr Janey of the abnormal EKG . No follow up in the sleep clinic is needed.        Social history:  Patient is on disability   2 [place] She[place] dogs,    [name] is a 5 y.o. Caucasian female patient seen here on 02-14-2021 upon referral for a sleep consultation. as a  referral on 02/14/2021. Chief concern according to patient :   I have iron  infusions every 3 months, intestinal blees suspected, GI work up, has interstitial cystitis, lost her bladder, has an [place] - pouch, lost urethra, appendix, uterus, and urinary bladder ( 12/23/ 11/11/2009) , cholecystectomy in 11-Nov-2017,    I have the pleasure of seeing [name], a right-handed Caucasian female with a possible sleep disorder.  She  has a past medical history of ADHD (attention deficit hyperactivity disorder), Allergy, Anemia, Anxiety, Bipolar disorder (HCC), Chronic back pain, Chronic headaches, Chronic interstitial cystitis, Depression, Fibromyalgia- Dr Dolphus, GERD (gastroesophageal reflux disease), Gestational diabetes, History of blood transfusion (02/2010),  History of kidney stones, HLD (hyperlipidemia), Hypothyroid, Interstitial cystitis, Self-catheterizes urinary bladder, Small bowel obstruction (HCC) (08/03/2015), and Thyroid  disease.     Sleep relevant medical history: Nocturia/- one, at 5 AM , Sleep walking childhood/ , Night terrors- post trauma.Interstim from 11-Nov-2008 11/11/17- Family medical /sleep history:  No other family member on CPAP with OSA, insomnia, sleep walkers. only child, parents died young, father at 21, when patient was 80 year old, of a tumor. Mother died of lung cancer in 2006/11/12. She left her when she was 38 months old. Raised by paternal grandparents.       Review of Systems: Out of a complete 14 system review, the patient complains of only the following symptoms, and all other reviewed systems are negative.:     How likely are you to doze in the following situations: 0 = not likely, 1 = slight chance, 2 = moderate chance, 3 = high chance   Sitting and Reading? Watching Television? Sitting inactive in a public place (theater or meeting)? As a passenger in a car for an hour without a break? Lying down in the afternoon when circumstances permit? Sitting and talking to someone? Sitting quietly after lunch without alcohol ? In a car, while stopped for a few minutes in traffic?   Total = 9/ 24 points   FSS endorsed at 47/ 63 points.   Social History   Socioeconomic History   Marital status: Married    Spouse name: Not on file   Number of children: 1   Years of education: Not on file   Highest education level: Not on file  Occupational History   Occupation: Disabled  Tobacco Use   Smoking status: Never   Smokeless tobacco: Never  Vaping Use   Vaping status: Never Used  Substance and Sexual Activity   Alcohol  use: Yes    Comment: 08/03/2015 glass of wine maybe twice/month   Drug use: No   Sexual activity: Yes    Comment: hysterectomy  Other Topics Concern   Not on file  Social History Narrative   Not on file    Social Drivers of Health   Financial Resource Strain: Not on file  Food Insecurity: No Food Insecurity (07/23/2023)   Hunger Vital Sign    Worried About Running Out of Food in the Last Year: Never true    Ran Out of Food in the Last Year: Never true  Transportation Needs: No Transportation Needs (07/23/2023)   PRAPARE - Administrator, Civil Service (Medical): No    Lack of Transportation (Non-Medical): No  Physical Activity: Not on file  Stress: Not on file  Social Connections: Unknown (07/23/2023)   Social Connection and Isolation Panel    Frequency of Communication with Friends and Family: Patient declined    Frequency of Social Gatherings with Friends and Family:  Patient declined    Attends Religious Services: Patient declined    Active Member of Clubs or Organizations: Patient declined    Attends Banker Meetings: Patient declined    Marital Status: Married    Family History  Problem Relation Age of Onset   Lung cancer Mother        smoker   Other Father        benigh tumor between heart and lungs, had surgery and something went wrong, he died at 61   Diabetes Paternal Grandmother    Heart disease Paternal Grandmother    Diabetes Paternal Grandfather    Heart disease Paternal Grandfather    Pancreatic cancer Paternal Grandfather    Liver cancer Paternal Grandfather    Breast cancer Maternal Aunt    Colon cancer Neg Hx    Esophageal cancer Neg Hx    Prostate cancer Neg Hx    Rectal cancer Neg Hx    Stomach cancer Neg Hx     Past Medical History:  Diagnosis Date   ADHD (attention deficit hyperactivity disorder)    Allergy    Anemia    Anxiety    Arthritis, lumbar spine    Bipolar disorder (HCC)    Chronic back pain    Chronic headaches    Chronic interstitial cystitis    removed my bladder when I went to end stage IC   Depression    Disc degeneration, lumbar 01/10/2022   Fibromyalgia    GERD (gastroesophageal reflux disease)     Gestational diabetes    resolved   History of blood transfusion 02/2010   related to OR   History of kidney stones    HLD (hyperlipidemia)    Hypothyroid    IBS (irritable colon syndrome)    IDA (iron  deficiency anemia)    Interstitial cystitis 06/2008   Dr. Janit Meeter [facility]   Polycystic ovarian disease    Preeclampsia 2009   Self-catheterizes urinary bladder    I have an [place]  pouch; made a bladder using part of my small intestines; cath out of stoma in my belly button (08/02/2015)   Small bowel obstruction (HCC) 08/03/2015   Spina bifida (HCC) 01/10/2022   Thyroid  disease    Vitamin D deficiency     Past Surgical History:  Procedure Laterality Date   ABDOMINAL HYSTERECTOMY  02/2010   left my ovaries   APPENDECTOMY  02/2010   BLADDER REMOVAL  02/2010   and [place]  Pouch   CESAREAN SECTION  2009   CHOLECYSTECTOMY N/A 05/27/2017   Procedure: LAPAROSCOPIC CHOLECYSTECTOMY;  Surgeon: Rubin Calamity, MD;  Location: [facility] OR;  Service: General;  Laterality: N/A;   COLONOSCOPY     ESOPHAGOGASTRODUODENOSCOPY (EGD) WITH PROPOFOL  N/A 07/25/2022   Procedure: ESOPHAGOGASTRODUODENOSCOPY (EGD) WITH PROPOFOL ;  Surgeon: Leigh Elspeth SQUIBB, MD;  Location: [facility] ENDOSCOPY;  Service: Gastroenterology;  Laterality: N/A;   HOT HEMOSTASIS N/A 07/25/2022   Procedure: HOT HEMOSTASIS (ARGON PLASMA COAGULATION/BICAP);  Surgeon: Leigh Elspeth SQUIBB, MD;  Location: [facility] ENDOSCOPY;  Service: Gastroenterology;  Laterality: N/A;   [place]  pouch  01/19/2021   Cystoscopy with bulk amid injection   LAPAROSCOPIC LYSIS OF ADHESIONS N/A 05/27/2017   Procedure: LAPAROSCOPIC LYSIS OF ADHESIONS;  Surgeon: Rubin Calamity, MD;  Location: [facility] OR;  Service: General;  Laterality: N/A;   OTHER SURGICAL HISTORY     remval of urethra when bladder was removed   SMALL INTESTINE SURGERY  2017   for bowel obstruction   SPINAL CORD STIMULATOR IMPLANT  09/2009; ~ 04/2015   SPINAL CORD STIMULATOR REMOVAL  ~ 04/2015   UPPER  GI ENDOSCOPY     WISDOM TOOTH EXTRACTION       Current Outpatient Medications on File Prior to Visit  Medication Sig Dispense Refill   acetaminophen  (TYLENOL ) 650 MG CR tablet Take 1,300 mg by mouth 2 (two) times daily.     amantadine (SYMMETREL) 100 MG capsule Take 100 mg by mouth 2 (two) times daily.     amphetamine -dextroamphetamine  (ADDERALL) 30 MG tablet Take 30 mg by mouth 2 (two) times daily.     AUSTEDO XR 48 MG TB24 Take 1 tablet by mouth every evening.     clonazePAM  (KLONOPIN ) 2 MG tablet Take 1 mg by mouth 3 (three) times daily as needed for anxiety.     D3-50 1.25 MG (50000 UT) capsule Take 50,000 Units by mouth 3 (three) times a week. Take one capsule by mouth on Tuesdays, Thursdays and Saturdays per patient     dicyclomine  (BENTYL ) 10 MG capsule TAKE 1 CAPSULE BY MOUTH TWICE A DAY 180 capsule 1   fenofibrate 160 MG tablet Take 160 mg by mouth at bedtime.     hydrocortisone  (ANUSOL -HC) 25 MG suppository Place 1 suppository (25 mg total) rectally at bedtime. 10 suppository 0   lamoTRIgine (LAMICTAL) 200 MG tablet Take 400 mg by mouth at bedtime.     levonorgestrel-ethinyl estradiol (SEASONALE,INTROVALE,JOLESSA) 0.15-0.03 MG tablet Take 1 tablet by mouth in the morning.     levothyroxine  (SYNTHROID ) 125 MCG tablet Take 125 mcg by mouth daily before breakfast.     lithium  carbonate (ESKALITH) 450 MG ER tablet Take 675 mg by mouth at bedtime.     Nebivolol HCl 20 MG TABS Take 1 tablet by mouth daily.     nitrofurantoin (MACRODANTIN) 50 MG capsule Take 50 mg by mouth at bedtime.     omeprazole  (PRILOSEC) 40 MG capsule Take 1 capsule (40 mg total) by mouth daily.     ondansetron  (ZOFRAN ) 4 MG tablet Take 0.5-1 tablets (2-4 mg total) by mouth every 8 (eight) hours as needed for vomiting or nausea. 20 tablet 0   ramelteon (ROZEREM) 8 MG tablet Take 1 tablet by mouth at bedtime.     rOPINIRole (REQUIP) 3 MG tablet Take 3 mg by mouth at bedtime.     rosuvastatin (CRESTOR) 5 MG tablet  Take 1 tablet by mouth at bedtime.     Safety Seal Miscellaneous MISC Apply 1 Application topically in the morning. Medication Name: Hormonic Hair Solution (Minoxidil 8%, Finasteride 0.05%) 30 g 6   telmisartan (MICARDIS) 40 MG tablet Take 40 mg by mouth at bedtime.     tiZANidine (ZANAFLEX) 4 MG tablet Take 8 mg by mouth 2 (two) times daily.     Current Facility-Administered Medications on File Prior to Visit  Medication Dose Route Frequency Provider Last Rate Last Admin   acetaminophen  (TYLENOL ) tablet 650 mg  650 mg Oral Once Thayil, Irene T, PA-C       diphenhydrAMINE  (BENADRYL ) capsule 25 mg  25 mg Oral Once Thayil, Irene T, PA-C        Allergies  Allergen Reactions   Keflex [Cephalexin] Hives    Can take with antihistamine   Penicillins Hives and Other (See Comments)    PATIENT HAS HAD A PCN REACTION WITH IMMEDIATE RASH, FACIAL/TONGUE/THROAT SWELLING, SOB, OR LIGHTHEADEDNESS WITH HYPOTENSION:  #  #  #  YES  #  #  #   Has patient had  a PCN reaction causing severe rash involving mucus membranes or skin necrosis: No Has patient had a PCN reaction that required hospitalization No Has patient had a PCN reaction occurring within the last 10 years: No If all of the above answers are NO, then may proceed with Cephalosporin use.    Sulfa Antibiotics Hives   Ozempic (0.25 Or 0.5 Mg-Dose) [Semaglutide(0.25 Or 0.5mg -Dos)] Other (See Comments)    Caused Abdominal block     DIAGNOSTIC DATA (LABS, IMAGING, TESTING) - I reviewed patient records, labs, notes, testing and imaging myself where available.  Component Ref Range & Units (hover)1 mo ago4 mo ago5 mo ago7 mo ago10 mo ago11 mo ago1 yr agoIron 50 88 77 99 91 50 86 TIBC624 High 588 High 543 High 580 High 512 High 567 High 559 High Saturation Ratios8 Low  15 14 17 18 9  Low  15 UIBC574 High 500 High  CM466 High  CM481 High  CM 421 CM 517 R, CM 473 High  CMComment: Performed at [facility], [address] [place], [place] [address]  Component Ref Range & Units (hover) 1 mo ago (09/11/23) 3 mo ago (07/24/23) 3 mo ago (07/23/23) 3 mo ago (07/22/23) 3 mo ago (07/22/23) 4 mo ago (06/11/23) 5 mo ago (05/06/23)  WBC Count 9.2 5.6 5.6  9.0 8.6 10.2  RBC 4.82 4.93 5.28 High   5.91 High  4.72 4.77  Hemoglobin 13.5 14.3 15.2 High  18.7 High  16.9 High  13.6 13.8  HCT 42.4 44.1 48.0 High  55.0 High  55.2 High  43.1 43.5  MCV 88.0 89.5 90.9  93.4 91.3 91.2  MCH 28.0 29.0 28.8  28.6 28.8 28.9  MCHC 31.8 32.4 31.7  30.6 31.6 31.7  RDW 13.6 13.3 13.5  13.3 13.0 12.5  Platelet Count 388 435 High  479 High   569 High  465 High  492 High   nRBC 0.0 0.0 CM 0.0 CM  0.0 0.0 0.0     Lab Results  Component Value Date   WBC 8.6 10/07/2023   HGB 13.2 10/07/2023   HCT 40.7 10/07/2023   MCV 86.9 10/07/2023   PLT 367.0 10/07/2023      Component Value Date/Time   NA 136 10/07/2023 1055   K 4.3 10/07/2023 1055   CL 103 10/07/2023 1055   CO2 22 10/07/2023 1055   GLUCOSE 127 (H) 10/07/2023 1055   BUN 14 10/07/2023 1055   CREATININE 0.74 10/07/2023 1055   CREATININE 0.82 07/24/2022 0954   CALCIUM 9.7 10/07/2023 1055   PROT 7.5 10/07/2023 1055   ALBUMIN 4.8 10/07/2023 1055   AST 17 10/07/2023 1055   AST 21 07/24/2022 0954   ALT 20 10/07/2023 1055   ALT 20 07/24/2022 0954   ALKPHOS 38 (L) 10/07/2023 1055   BILITOT 0.2 10/07/2023 1055   BILITOT 0.4 07/24/2022 0954   GFRNONAA >60 07/24/2023 0358   GFRNONAA >60 07/24/2022 0954   GFRAA 60 (L) 05/23/2017 1000   No results found for: CHOL, HDL, LDLCALC, LDLDIRECT, TRIG, CHOLHDL Lab Results  Component Value Date   HGBA1C 5.2 07/22/2023   No results found for: VITAMINB12 No results found for: TSH  PHYSICAL EXAM:  Jones's Vitals   10/23/23 0847  BP: (!) 140/86  Pulse: 83  Weight: 240 lb (108.9 kg)  Height: 5' 6 (1.676 m)   Body mass index is 38.74 kg/m.   Wt Readings from Last 3 Encounters:  10/23/23  240 lb (  108.9 kg)  10/07/23 239 lb (108.4 kg)  09/19/23 234 lb 12.8 oz (106.5 kg)     Ht Readings from Last 3 Encounters:  10/23/23 5' 6 (1.676 m)  10/07/23 5' 6 (1.676 m)  09/19/23 5' 6 (1.676 m)      General: The patient is awake, alert and appears not in acute distress. The patient is well groomed. Head: Normocephalic, atraumatic. Neck is supple.  :  Regular rate and cardiac rhythm by pulse,  without distended neck veins. Respiratory: Lungs are clear to auscultation.  Skin:  Without evidence of ankle edema, or rash. Trunk: The patient's posture is erect.   NEUROLOGIC EXAM: The patient is awake and alert, oriented to place and time.   Memory subjective described as intact.  Attention span & concentration ability appears normal.  Speech is fluent,  without  dysarthria, dysphonia or aphasia.  Mood and affect are appropriate.   Cranial nerves: no loss of smell or taste reported  Pupils are equal and briskly reactive to light. Funduscopic exam deferred. .  Extraocular movements in vertical  planes were intact and without nystagmus.but horizontal e gaze to the right has a endpoint nystagmus  .  No Diplopia. Visual fields by finger perimetry are intact. Hearing was intact to soft voice and finger rubbing.    Facial sensation intact to fine touch.  Facial motor strength is symmetric and tongue and uvula move midline.  Neck ROM : rotation, tilt and flexion extension were normal for age and shoulder shrug was symmetrical.    Motor exam:  Symmetric bulk, but  mild rigidity-   cog- wheeling,  more on the left.  Has symmetric grip strength .   Sensory:  pain radiating from the  right shoulder to hand / into all 5 fingers, and from right hip to the hamstring and usually stops at the knee ( behind) , but sometimes a sharp stabbing pain goes to the heel.    Fine touch, pinprick and vibration were felt normal.  Proprioception tested in the upper extremities was normal.   Coordination:  Rapid alternating movements in the fingers/hands were of normal speed.  The Finger-to-nose maneuver was intact without evidence of ataxia, dysmetria , there is a resting tremor in the right hand.    Gait and station: Patient could rise unassisted from a seated position, walked without assistive device.   Toe and heel walk were deferred.  Deep tendon reflexes: in the  upper and lower extremities are symmetric and intact.  Babinski response was deferred..     ASSESSMENT AND PLAN 43 y.o. year old female  here with:  Spina bifida occulta , red birth mark over the lumbar area.  Had a spinal cord stimulator , removed 2018.     1) multifocal pain and dysesthesias , but  dominantly on the right  extremities.    2) chronic pain is followed elsewhere, but she has not followed her last referral to [facility] for [facility] management.   Fibromyalgia - but no rheumatologist is following.  Pain management : had ketamine  infusions for pain.   3)  resting  tremor and action tremor related to tardive dyskinesia. Orofacial dyskinesia.     I had the pleasure of seeing [name]. [name] Jones again this is a 32 months after our last visit.  The patient had a sleep study in our lab that did not reveal an organic sleep disorder at the time but we were concerned about irregular heartbeats and she  also was diaphoretic during the night.  The patient is here Jones still reporting nonrestorative sleep and excessive fatigue but she has a sensory concern about multifocal pain but more than this is seizures.  Sensory abnormalities that affects the right upper and right lower extremity much more than her left side of the body.  The patient has an extensive surgical history as well she had abdominal surgery, bladder surgery, hysterectomy, a spinal cord stimulator implanted.  She also had it explanted again 2018.  She is followed by a psychiatrist locally Dr. Vincente , For bipolar disorder.  Further remarkable is her  family history both of her parents died father young and her mother's siblings all died young. She has a paternal cousin that was diagnosed with multiple sclerosis at age 52 or 1.  The reported vision abnormality is also concerning she reports a starburst pattern in the upper visual field that comes and goes, it is not associated with a headache and it can happen at various times of the day seems not to be related to hydration status, meal intake and is not triggered by oncoming lights.  Again there is no migraine that follows this so I do not think it is an aura.  Her eye doctor recently reported that her visual acuity for the right eye has decreased.   PLAN :  All these findings are concerning for possible demyelinating disease and I will order an MRI of the brain and neck spine.  She does not have an implanted device in her spinal cord anymore so we can do MRIs and I would like to do them with and without contrast.  I also would like to do them in the hospital setting , if possible.  Amb .referral to ophthalmology   [facility] neuropathy inflammatory panel.   I plan to follow up either personally or through our NP within 4- 5  months.   I would like to thank Dr Vincente, Dr Leigh and Janey Santos, Md [address] [place],  [place] [address] for allowing me to meet with and to take care of this pleasant patient.   Discussion of sleep hygiene setting bedtime and rise time,  hot shower  before bed time, no screen light in the bedroom, the bedroom should be cool, quiet and dark. Night lights should illuminate the floor not shine into your eyes. Golden glow  light is less intrusive than blue or cold light.  Read in a book with pages, not on a device. Consider audio books and soothing  sound -scapes.    After spending a total time of  40  minutes face to face and additional time for physical and neurologic examination, review of laboratory studies,  personal review of imaging studies, reports  and results of other testing and review of referral information / records as far as provided in visit,   Electronically signed by: Dedra Gores, MD 10/23/2023 9:17 AM  [facility] and [facility] Sleep Board certified by The [employer] of Sleep Medicine and Diplomate of the [employer] of Sleep Medicine. Board certified In Neurology through the ABPN, Fellow of the [employer] of Neurology.

## 2023-10-23 NOTE — Patient Instructions (Addendum)
 ASSESSMENT AND PLAN 43 y.o. year old female  here with:   Spina bifida occulta , red birth mark over the lumbar area.  Had a spinal cord stimulator , removed 2018.      1) multifocal pain and dysesthesias , but  dominantly on the right  extremities.     2) chronic pain is followed elsewhere, but she has not followed her last referral to [facility] for pain management.   Fibromyalgia - but no rheumatologist is following.  Pain management : had ketamine  infusions for pain.    3)  resting  tremor and action tremor related to tardive dyskinesia. Orofacial dyskinesia.        I had the pleasure of seeing [name]. [name] today again this is a 32 months after our last visit.  The patient had a sleep study in our lab that did not reveal an organic sleep disorder at the time but we were concerned about irregular heartbeats and she also was diaphoretic during the night.  The patient is here today still reporting nonrestorative sleep and excessive fatigue but she has a sensory concern about multifocal pain but more than this is seizures.  Sensory abnormalities that affects the right upper and right lower extremity much more than her left side of the body.  The patient has an extensive surgical history as well she had abdominal surgery, bladder surgery, hysterectomy, a spinal cord stimulator implanted.  She also had it explanted again 2018.  She is followed by a psychiatrist locally Dr. Vincente , For bipolar disorder.  Further remarkable is her family history both of her parents died father young and her mother's siblings all died young. She has a paternal cousin that was diagnosed with multiple sclerosis at age 39 or 16.   The reported vision abnormality is also concerning she reports a starburst pattern in the upper visual field that comes and goes, it is not associated with a headache and it can happen at various times of the day seems not to be related to hydration status, meal intake and is  not triggered by oncoming lights.  Again there is no migraine that follows this so I do not think it is an aura.   Her eye doctor recently reported that her visual acuity for the right eye has decreased.     PLAN :  All these findings are concerning for possible demyelinating disease and I will order an MRI of the brain and neck spine.  She does not have an implanted device in her spinal cord anymore so we can do MRIs and I would like to do them with and without contrast.   I also would like to do them in the hospital setting,  if possible.       I plan to follow up either personally or through our NP within 4 months.    I would like to thank Dr Vincente, Dr Leigh  and Janey Santos, Md [address] [place],  [place] [address] for allowing me to meet with and to take care of this pleasant patient.    Pain Without a Known Cause: What It Means Pain can happen anywhere in your body. Pain can range from mild to very bad. Sometimes, no cause can be found for pain. Some common types of pain that can happen without a known cause include: Headache. Back pain. Belly pain. Neck pain. Your health care provider will try to find the cause of your pain. If they can't find  one, they'll treat you for pain without a known cause. Your provider may do tests, repeat tests that were already done, and keep looking for a cause. Follow these instructions at home: Medicines Take your medicines only as told. You may need to take steps to help treat or prevent trouble pooping (constipation), such as: Taking medicines to help you poop. Eating foods high in fiber, like beans, whole grains, and fresh fruits and vegetables. Drinking more fluids as told. Ask your provider if it's safe to drive or use machines while taking your medicine. Managing pain, stiffness, and swelling     Use ice or an ice pack as told. Place a towel between your skin and the ice. Leave the ice on for 20 minutes, 2-3 times a  day. Use heat as told. Use the heat source that your provider recommends, such as a moist heat pack or a heating pad. Do this as often as told. Place a towel between your skin and the heat source. Leave the heat on for 20-30 minutes. If your skin turns red, take off the ice or heat right away to prevent skin damage. The risk of damage is higher if you can't feel pain, heat, or cold. General instructions  Stop any activities that cause pain. Rest as told when you have very bad pain. Ask what things are safe for you to do at home. Ask when you can go back to work or school. Get regular exercise. Lower your stress with activities such as yoga or meditation. Talk with your provider about other ways to lower stress. Eat healthy. A balanced diet includes fruits and vegetables, whole grains, lean meat, and low-fat dairy. Tell your provider if: You have any questions. You'd like to talk with an expert in healthy eating called a dietician. Contact a health care provider if: Your pain isn't controlled. Treatment doesn't help your pain. Get help right away if: You feel like you may hurt yourself or others. You have thoughts about taking your own life. You have other thoughts or feelings that worry you. These symptoms may be an emergency. Take one of these steps right away: Go to your nearest emergency room. Call 911. Contact the Suicide & Crisis Lifeline (24/7, free and confidential): Call or text 988. Chat online at chat.NewsActor.se. For Veterans and their loved ones: Call 988 and press 1. Text the [employer] at (906) 361-3915. Chat online at ReservationsList.si. This information is not intended to replace advice given to you by your health care provider. Make sure you discuss any questions you have with your health care provider. Document Revised: 05/16/2023 Document Reviewed: 05/16/2023 Elsevier Patient Education  2025 [employer].

## 2023-10-23 NOTE — Telephone Encounter (Signed)
-----   Message from Nurse Naomie RAMAN sent at 05/15/2023  9:03 AM EDT ----- Patient needs MRI liver with eovist  around 11/04/23 for liver lesion- FNH vs hepatic adenoma. Put in orders and schedule. [facility] pt. See 05/08/23 liver mri result for original order

## 2023-10-23 NOTE — Telephone Encounter (Signed)
 Referral to Ophthalmology faxed to  [facility]  [phone] Fax: [phone]

## 2023-10-24 ENCOUNTER — Encounter: Payer: Self-pay | Admitting: Neurology

## 2023-10-24 ENCOUNTER — Ambulatory Visit: Payer: Self-pay | Admitting: Neurology

## 2023-10-25 ENCOUNTER — Other Ambulatory Visit: Payer: Self-pay | Admitting: Gastroenterology

## 2023-10-26 LAB — CBC WITH DIFFERENTIAL/PLATELET
Basophils Absolute: 0.1 x10E3/uL (ref 0.0–0.2)
Basos: 1 %
EOS (ABSOLUTE): 0.3 x10E3/uL (ref 0.0–0.4)
Eos: 3 %
Hematocrit: 43.3 % (ref 34.0–46.6)
Hemoglobin: 13.6 g/dL (ref 11.1–15.9)
Immature Grans (Abs): 0.2 x10E3/uL — ABNORMAL HIGH (ref 0.0–0.1)
Immature Granulocytes: 2 %
Lymphocytes Absolute: 2 x10E3/uL (ref 0.7–3.1)
Lymphs: 22 %
MCH: 28.5 pg (ref 26.6–33.0)
MCHC: 31.4 g/dL — ABNORMAL LOW (ref 31.5–35.7)
MCV: 91 fL (ref 79–97)
Monocytes Absolute: 0.6 x10E3/uL (ref 0.1–0.9)
Monocytes: 7 %
Neutrophils Absolute: 6.3 x10E3/uL (ref 1.4–7.0)
Neutrophils: 65 %
Platelets: 495 x10E3/uL — ABNORMAL HIGH (ref 150–450)
RBC: 4.77 x10E6/uL (ref 3.77–5.28)
RDW: 14.6 % (ref 11.7–15.4)
WBC: 9.5 x10E3/uL (ref 3.4–10.8)

## 2023-10-26 LAB — HEMOGLOBIN A1C
Est. average glucose Bld gHb Est-mCnc: 131 mg/dL
Hgb A1c MFr Bld: 6.2 % — ABNORMAL HIGH (ref 4.8–5.6)

## 2023-10-26 LAB — SJOGREN'S SYNDROME ANTIBODS(SSA + SSB)
ENA SSA (RO) Ab: <0.2 AI (ref 0.0–0.9)
ENA SSB (LA) Ab: <0.2 AI (ref 0.0–0.9)

## 2023-10-26 LAB — TSH: TSH: 2.91 u[IU]/mL (ref 0.450–4.500)

## 2023-10-26 LAB — COMPREHENSIVE METABOLIC PANEL WITH GFR
ALT: 23 IU/L (ref 0–32)
AST: 23 IU/L (ref 0–40)
Albumin: 4.9 g/dL (ref 3.9–4.9)
Alkaline Phosphatase: 51 IU/L (ref 44–121)
BUN/Creatinine Ratio: 18 (ref 9–23)
BUN: 13 mg/dL (ref 6–24)
Bilirubin Total: 0.2 mg/dL (ref 0.0–1.2)
CO2: 17 mmol/L — ABNORMAL LOW (ref 20–29)
Calcium: 10.7 mg/dL — ABNORMAL HIGH (ref 8.7–10.2)
Chloride: 100 mmol/L (ref 96–106)
Creatinine, Ser: 0.73 mg/dL (ref 0.57–1.00)
Globulin, Total: 2.7 g/dL (ref 1.5–4.5)
Glucose: 123 mg/dL — ABNORMAL HIGH (ref 70–99)
Potassium: 5.1 mmol/L (ref 3.5–5.2)
Sodium: 135 mmol/L (ref 134–144)
Total Protein: 7.6 g/dL (ref 6.0–8.5)
eGFR: 104 mL/min/1.73 (ref >59)

## 2023-10-26 LAB — MULTIPLE MYELOMA PANEL, SERUM
Albumin SerPl Elph-Mcnc: 4.3 g/dL (ref 2.9–4.4)
Albumin/Glob SerPl: 1.4 (ref 0.7–1.7)
Alpha 1: 0.3 g/dL (ref 0.0–0.4)
Alpha2 Glob SerPl Elph-Mcnc: 1.2 g/dL — ABNORMAL HIGH (ref 0.4–1.0)
B-Globulin SerPl Elph-Mcnc: 1.2 g/dL (ref 0.7–1.3)
Gamma Glob SerPl Elph-Mcnc: 0.6 g/dL (ref 0.4–1.8)
Globulin, Total: 3.3 g/dL (ref 2.2–3.9)
IgA/Immunoglobulin A, Serum: 102 mg/dL (ref 87–352)
IgG (Immunoglobin G), Serum: 639 mg/dL (ref 586–1602)
IgM (Immunoglobulin M), Srm: 24 mg/dL — ABNORMAL LOW (ref 26–217)

## 2023-10-26 LAB — HOMOCYSTEINE: Homocysteine: 15.7 umol/L — ABNORMAL HIGH (ref 0.0–14.5)

## 2023-10-26 LAB — ANCA PROFILE
Anti-MPO Antibodies: <0.2 U (ref 0.0–0.9)
Anti-PR3 Antibodies: <0.2 U (ref 0.0–0.9)
Atypical pANCA: <1:20 {titer}
C-ANCA: <1:20 {titer}
P-ANCA: <1:20 {titer}

## 2023-10-26 LAB — METHYLMALONIC ACID, SERUM: Methylmalonic Acid: 141 nmol/L (ref 0–378)

## 2023-10-26 LAB — SEDIMENTATION RATE: Sed Rate: 9 mm/h (ref 0–32)

## 2023-10-26 LAB — ANA W/REFLEX: ANA Titer 1: NEGATIVE

## 2023-10-26 LAB — C-REACTIVE PROTEIN: CRP: 8 mg/L (ref 0–10)

## 2023-10-26 LAB — VITAMIN B12: Vitamin B-12: 247 pg/mL (ref 232–1245)

## 2023-10-29 ENCOUNTER — Ambulatory Visit (INDEPENDENT_AMBULATORY_CARE_PROVIDER_SITE_OTHER): Payer: BC Managed Care – PPO | Admitting: Dermatology

## 2023-10-29 ENCOUNTER — Encounter: Payer: Self-pay | Admitting: Dermatology

## 2023-10-29 VITALS — BP 123/80 | HR 72

## 2023-10-29 DIAGNOSIS — L649 Androgenic alopecia, unspecified: Secondary | ICD-10-CM | POA: Diagnosis not present

## 2023-10-29 DIAGNOSIS — K31819 Angiodysplasia of stomach and duodenum without bleeding: Secondary | ICD-10-CM | POA: Diagnosis not present

## 2023-10-29 DIAGNOSIS — E611 Iron deficiency: Secondary | ICD-10-CM

## 2023-10-29 MED ORDER — SAFETY SEAL MISCELLANEOUS MISC: 1.0000 | Freq: Every morning | 6 refills | Status: AC

## 2023-10-29 MED ORDER — MINOXIDIL 2.5 MG PO TABS: 1.2500 mg | ORAL_TABLET | Freq: Every day | ORAL | 9 refills | Status: DC

## 2023-10-29 NOTE — Progress Notes (Signed)
   Follow-Up Visit   Subjective  [name] is a 43 y.o. female who presents for the following: Androgenetic alopecia   Patient present today for follow up visit for Androgenetic alopecia . Patient was last evaluated on 04/29/23. At this visit patient was prescribed Hormonic Hair Solution(Minox/Finasteride) to [facility] & vivascal. Patient reports sxs are improving but not at goal. Patient denies medication changes. Patient had hysterectomy so she is not currently pregnant or trying to conceive.  The following portions of the chart were reviewed this encounter and updated as appropriate: medications, allergies, medical history  Review of Systems:  No other skin or systemic complaints except as noted in HPI or Assessment and Plan.  Objective  Well appearing patient in no apparent distress; mood and affect are within normal limits.  A focused examination was performed of the following areas: Scalp  Relevant exam findings are noted in the Assessment and Plan.              Assessment & Plan   1. 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 Pattern consistent with androgenetic alopecia. 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  - Plan:    Discontinue topical minoxidil     Start oral minoxidil  1/2 tablet at night for first month, then increase to 1 full tablet nightly if well-tolerated    Continue topical finasteride every morning    Continue Viviscal supplement    Add Collagen supplement for potential benefits to hair, skin, and joints    Informed consent provided regarding potential side effects including lightheadedness, dizziness, and unwanted facial hair growth  Follow-up in 6 months to assess response to oral minoxidil  therapy.    Return  in about 6 months (around 04/30/2024) for Androgenetic Alopecia F/U.  I, Jetta Ager, am acting as Neurosurgeon for [employer], DO.  Documentation: I have reviewed the above documentation for accuracy and completeness, and I agree with the above.  Delon Lenis, DO

## 2023-10-29 NOTE — Patient Instructions (Addendum)
 Date: Tue Oct 29 2023  Hello [name],  Thank you for visiting today. Here is a summary of the key instructions:  - Medications:   - Start oral minoxidil : take half a tablet at night for the first month   - If no side effects, increase to one full tablet at night after the first month   - Stop using topical minoxidil    - Continue applying topical finasteride every morning   - Continue taking Viviscal supplement  - Other Instructions:   - Consider adding collagen supplement for hair, skin, and joint health   - Watch for unwanted hair growth on the face   - Be alert for lightheadedness or dizziness  - Follow-up:   - Return for follow-up appointment in 6 months  Please reach out if you have any questions or concerns.  Warm regards,  Dr. Delon Lenis Dermatology           Important Information   Due to recent changes in healthcare laws, you may see results of your pathology and/or laboratory studies on MyChart before the doctors have had a chance to review them. We understand that in some cases there may be results that are confusing or concerning to you. Please understand that not all results are received at the same time and often the doctors may need to interpret multiple results in order to provide you with the best plan of care or course of treatment. Therefore, we ask that you please give us  2 business days to thoroughly review all your results before contacting the office for clarification. Should we see a critical lab result, you will be contacted sooner.     If You Need Anything After Your Visit   If you have any questions or concerns for your doctor, please call our main line at [phone]. If no one answers, please leave a voicemail as directed and we will return your call as soon as possible. Messages left after 4 pm will be answered the following business day.    You may also send us  a message via MyChart. We typically respond to MyChart messages within  1-2 business days.  For prescription refills, please ask your pharmacy to contact our office. Our fax number is [phone].  If you have an urgent issue when the clinic is closed that cannot wait until the next business day, you can page your doctor at the number below.     Please note that while we do our best to be available for urgent issues outside of office hours, we are not available 24/7.    If you have an urgent issue and are unable to reach us , you may choose to seek medical care at your doctor's office, retail clinic, urgent care center, or emergency room.   If you have a medical emergency, please immediately call 911 or go to the emergency department. In the event of inclement weather, please call our main line at [phone] for an update on the status of any delays or closures.  Dermatology Medication Tips: Please keep the boxes that topical medications come in in order to help keep track of the instructions about where and how to use these. Pharmacies typically print the medication instructions only on the boxes and not directly on the medication tubes.   If your medication is too expensive, please contact our office at [phone] or send us  a message through MyChart.    We are unable to tell what your co-pay for medications will be  in advance as this is different depending on your insurance coverage. However, we may be able to find a substitute medication at lower cost or fill out paperwork to get insurance to cover a needed medication.    If a prior authorization is required to get your medication covered by your insurance company, please allow us  1-2 business days to complete this process.   Drug prices often vary depending on where the prescription is filled and some pharmacies may offer cheaper prices.   The website www.goodrx.com contains coupons for medications through different pharmacies. The prices here do not account for what the cost may be with help from  insurance (it may be cheaper with your insurance), but the website can give you the price if you did not use any insurance.  - You can print the associated coupon and take it with your prescription to the pharmacy.  - You may also stop by our office during regular business hours and pick up a GoodRx coupon card.  - If you need your prescription sent electronically to a different pharmacy, notify our office through [facility] or by phone at [phone]

## 2023-10-31 DIAGNOSIS — R339 Retention of urine, unspecified: Secondary | ICD-10-CM | POA: Diagnosis not present

## 2023-11-01 ENCOUNTER — Other Ambulatory Visit: Payer: Self-pay | Admitting: Obstetrics & Gynecology

## 2023-11-01 DIAGNOSIS — Z1231 Encounter for screening mammogram for malignant neoplasm of breast: Secondary | ICD-10-CM

## 2023-11-05 ENCOUNTER — Ambulatory Visit (HOSPITAL_COMMUNITY)
Admission: RE | Admit: 2023-11-05 | Discharge: 2023-11-05 | Disposition: A | Discharge status: Home / Self Care | Source: Ambulatory Visit | Attending: Gastroenterology | Admitting: Gastroenterology

## 2023-11-05 DIAGNOSIS — R932 Abnormal findings on diagnostic imaging of liver and biliary tract: Secondary | ICD-10-CM | POA: Insufficient documentation

## 2023-11-05 DIAGNOSIS — K760 Fatty (change of) liver, not elsewhere classified: Secondary | ICD-10-CM | POA: Diagnosis not present

## 2023-11-05 DIAGNOSIS — Q631 Lobulated, fused and horseshoe kidney: Secondary | ICD-10-CM | POA: Diagnosis not present

## 2023-11-05 DIAGNOSIS — R160 Hepatomegaly, not elsewhere classified: Secondary | ICD-10-CM | POA: Diagnosis not present

## 2023-11-05 MED ORDER — GADOXETATE DISODIUM 0.25 MMOL/ML IV SOLN
10.0000 mL | Freq: Once | INTRAVENOUS | Status: AC | PRN
  Administered 2023-11-05: 10 mL via INTRAVENOUS

## 2023-11-06 ENCOUNTER — Ambulatory Visit: Attending: Adult Health

## 2023-11-06 ENCOUNTER — Other Ambulatory Visit: Payer: Self-pay | Admitting: *Deleted

## 2023-11-06 DIAGNOSIS — I10 Essential (primary) hypertension: Secondary | ICD-10-CM | POA: Diagnosis not present

## 2023-11-06 DIAGNOSIS — R000 Tachycardia, unspecified: Secondary | ICD-10-CM

## 2023-11-06 NOTE — Progress Notes (Unsigned)
 Enrolled for [employer] to mail a ZIO XT long term holter monitor to the patients address on file.   EP to read

## 2023-11-08 ENCOUNTER — Ambulatory Visit: Payer: Self-pay | Admitting: Gastroenterology

## 2023-11-12 ENCOUNTER — Encounter: Payer: Self-pay | Admitting: Neurology

## 2023-11-26 ENCOUNTER — Ambulatory Visit
Admission: RE | Admit: 2023-11-26 | Discharge: 2023-11-26 | Disposition: A | Discharge status: Home / Self Care | Source: Ambulatory Visit | Attending: Obstetrics & Gynecology | Admitting: Obstetrics & Gynecology

## 2023-11-26 DIAGNOSIS — Z1231 Encounter for screening mammogram for malignant neoplasm of breast: Secondary | ICD-10-CM

## 2023-11-29 DIAGNOSIS — R000 Tachycardia, unspecified: Secondary | ICD-10-CM

## 2023-12-02 DIAGNOSIS — R339 Retention of urine, unspecified: Secondary | ICD-10-CM | POA: Diagnosis not present

## 2023-12-05 ENCOUNTER — Encounter: Payer: Self-pay | Admitting: Neurology

## 2023-12-09 ENCOUNTER — Other Ambulatory Visit: Payer: Self-pay | Admitting: Physician Assistant

## 2023-12-09 DIAGNOSIS — D509 Iron deficiency anemia, unspecified: Secondary | ICD-10-CM

## 2023-12-10 ENCOUNTER — Ambulatory Visit: Payer: Self-pay | Admitting: Physician Assistant

## 2023-12-10 ENCOUNTER — Inpatient Hospital Stay: Admitting: Physician Assistant

## 2023-12-10 ENCOUNTER — Inpatient Hospital Stay: Attending: Physician Assistant

## 2023-12-10 VITALS — BP 151/83 | HR 76 | Temp 97.6°F | Resp 16 | Ht 66.0 in

## 2023-12-10 DIAGNOSIS — N83201 Unspecified ovarian cyst, right side: Secondary | ICD-10-CM | POA: Diagnosis not present

## 2023-12-10 DIAGNOSIS — Z801 Family history of malignant neoplasm of trachea, bronchus and lung: Secondary | ICD-10-CM | POA: Diagnosis not present

## 2023-12-10 DIAGNOSIS — Z803 Family history of malignant neoplasm of breast: Secondary | ICD-10-CM | POA: Diagnosis not present

## 2023-12-10 DIAGNOSIS — K769 Liver disease, unspecified: Secondary | ICD-10-CM | POA: Insufficient documentation

## 2023-12-10 DIAGNOSIS — Z79899 Other long term (current) drug therapy: Secondary | ICD-10-CM | POA: Insufficient documentation

## 2023-12-10 DIAGNOSIS — Z808 Family history of malignant neoplasm of other organs or systems: Secondary | ICD-10-CM | POA: Insufficient documentation

## 2023-12-10 DIAGNOSIS — D509 Iron deficiency anemia, unspecified: Secondary | ICD-10-CM | POA: Diagnosis not present

## 2023-12-10 DIAGNOSIS — Z800 Family history of malignant neoplasm of digestive organs: Secondary | ICD-10-CM | POA: Insufficient documentation

## 2023-12-10 LAB — CBC WITH DIFFERENTIAL (CANCER CENTER ONLY)
Abs Immature Granulocytes: 0.08 K/uL — ABNORMAL HIGH (ref 0.00–0.07)
Basophils Absolute: 0.1 K/uL (ref 0.0–0.1)
Basophils Relative: 1 %
Eosinophils Absolute: 0.3 K/uL (ref 0.0–0.5)
Eosinophils Relative: 3 %
HCT: 40.9 % (ref 36.0–46.0)
Hemoglobin: 13.2 g/dL (ref 12.0–15.0)
Immature Granulocytes: 1 %
Lymphocytes Relative: 24 %
Lymphs Abs: 2.2 K/uL (ref 0.7–4.0)
MCH: 28.2 pg (ref 26.0–34.0)
MCHC: 32.3 g/dL (ref 30.0–36.0)
MCV: 87.4 fL (ref 80.0–100.0)
Monocytes Absolute: 0.7 K/uL (ref 0.1–1.0)
Monocytes Relative: 7 %
Neutro Abs: 5.9 K/uL (ref 1.7–7.7)
Neutrophils Relative %: 64 %
Platelet Count: 401 K/uL — ABNORMAL HIGH (ref 150–400)
RBC: 4.68 MIL/uL (ref 3.87–5.11)
RDW: 14.4 % (ref 11.5–15.5)
WBC Count: 9.3 K/uL (ref 4.0–10.5)
nRBC: 0 % (ref 0.0–0.2)

## 2023-12-10 LAB — CMP (CANCER CENTER ONLY)
ALT: 23 U/L (ref 0–44)
AST: 27 U/L (ref 15–41)
Albumin: 4.8 g/dL (ref 3.5–5.0)
Alkaline Phosphatase: 46 U/L (ref 38–126)
Anion gap: 11 (ref 5–15)
BUN: 14 mg/dL (ref 6–20)
CO2: 22 mmol/L (ref 22–32)
Calcium: 10.8 mg/dL — ABNORMAL HIGH (ref 8.9–10.3)
Chloride: 101 mmol/L (ref 98–111)
Creatinine: 0.92 mg/dL (ref 0.44–1.00)
GFR, Estimated: >60 mL/min (ref >60)
Glucose, Bld: 133 mg/dL — ABNORMAL HIGH (ref 70–99)
Potassium: 4.4 mmol/L (ref 3.5–5.1)
Sodium: 134 mmol/L — ABNORMAL LOW (ref 135–145)
Total Bilirubin: 0.3 mg/dL (ref 0.0–1.2)
Total Protein: 7.9 g/dL (ref 6.5–8.1)

## 2023-12-10 LAB — IRON AND IRON BINDING CAPACITY (CC-WL,HP ONLY)
Iron: 47 ug/dL (ref 28–170)
Saturation Ratios: 7 % — ABNORMAL LOW (ref 10.4–31.8)
TIBC: 647 ug/dL — ABNORMAL HIGH (ref 250–450)
UIBC: 600 ug/dL — ABNORMAL HIGH (ref 148–442)

## 2023-12-10 LAB — FERRITIN: Ferritin: 36 ng/mL (ref 11–307)

## 2023-12-10 NOTE — Progress Notes (Signed)
 [facility] [phone]   [phone]  PROGRESS NOTE  Patient Care Team: Janey Santos, MD as PCP - General (Internal Medicine)  CHIEF COMPLAINTS/PURPOSE OF CONSULTATION:  Iron  deficiency anemia  TREATMENT HISTORY: -Received IV feraheme  x 2 doses on 05/04/2020 and 05/11/2020 -Received IV venofer  x 5 doses on 01/04/2021-02/01/2021 -Received IV venofer  x 5 doses on 03/21/2022-04/18/2022.  -Received IV venofer  x 5 doses on 06/22/2022-07/20/2022.  -Received IV venofer  x 2 doses on 08/14/2022-08/21/2022.  -Received IV monoferric  x 1 dose on 09/24/2022 -Received IV monoferric  x 1 dose on 11/13/2022.  -Received IV monoferric  x 1 dose on 09/19/2023.   HISTORY OF PRESENTING ILLNESS:     [name] 43 y.o. female returns for a follow up for iron  deficiency anemia. She was last seen on 06/11/2023. In the interim, she received IV monoferric  x 1 dose. She presents today unaccompanied.   Ms. Hogle reports having persistent fatigue which interferes with her daily activities. Her appetite is unchanged but has noted 9 lb weight gain since August 2025. She reports occasional episodes of nausea. Her bowel habits are persistent with varying episodes of diarrhea and constipation. She reports occasional episodes of blood around her stoma and intermittent episodes of bright red blood in the stool.  She denies any fevers, chills, shortness of breath, chest pain or cough. She has no other complaints.   All other systems were reviewed with the patient and are negative.  MEDICAL HISTORY:  Past Medical History:  Diagnosis Date   ADHD (attention deficit hyperactivity disorder)    Allergy    Anemia    Anxiety    Arthritis, lumbar spine    Bipolar disorder (HCC)    Chronic back pain    Chronic headaches    Chronic interstitial cystitis    removed my bladder when I went to end stage IC   Depression    Disc degeneration, lumbar 01/10/2022   Fibromyalgia    GERD  (gastroesophageal reflux disease)    Gestational diabetes    resolved   History of blood transfusion 02/2010   related to OR   History of kidney stones    HLD (hyperlipidemia)    Hypothyroid    IBS (irritable colon syndrome)    IDA (iron  deficiency anemia)    Interstitial cystitis 06/2008   Dr. Janit Meeter [facility]   Polycystic ovarian disease    Preeclampsia 2009   Self-catheterizes urinary bladder    I have an [place]  pouch; made a bladder using part of my small intestines; cath out of stoma in my belly button (08/02/2015)   Small bowel obstruction (HCC) 08/03/2015   Spina bifida (HCC) 01/10/2022   Thyroid  disease    Vitamin D deficiency     SURGICAL HISTORY: Past Surgical History:  Procedure Laterality Date   ABDOMINAL HYSTERECTOMY  02/2010   left my ovaries   APPENDECTOMY  02/2010   BLADDER REMOVAL  02/2010   and [place]  Pouch   BREAST BIOPSY Left 10/26/2019   CESAREAN SECTION  2009   CHOLECYSTECTOMY N/A 05/27/2017   Procedure: LAPAROSCOPIC CHOLECYSTECTOMY;  Surgeon: Rubin Calamity, MD;  Location: [facility] OR;  Service: General;  Laterality: N/A;   COLONOSCOPY     ESOPHAGOGASTRODUODENOSCOPY (EGD) WITH PROPOFOL  N/A 07/25/2022   Procedure: ESOPHAGOGASTRODUODENOSCOPY (EGD) WITH PROPOFOL ;  Surgeon: Leigh Elspeth SQUIBB, MD;  Location: [facility];  Service: Gastroenterology;  Laterality: N/A;   HOT HEMOSTASIS N/A 07/25/2022   Procedure: HOT HEMOSTASIS (ARGON PLASMA COAGULATION/BICAP);  Surgeon: Leigh Elspeth SQUIBB, MD;  Location: [facility];  Service: Gastroenterology;  Laterality: N/A;   [place]  pouch  01/19/2021   Cystoscopy with bulk amid injection   LAPAROSCOPIC LYSIS OF ADHESIONS N/A 05/27/2017   Procedure: LAPAROSCOPIC LYSIS OF ADHESIONS;  Surgeon: Rubin Calamity, MD;  Location: [facility] OR;  Service: General;  Laterality: N/A;   OTHER SURGICAL HISTORY     remval of urethra when bladder was removed   SMALL INTESTINE SURGERY  2017   for bowel obstruction   SPINAL  CORD STIMULATOR IMPLANT  09/2009; ~ 04/2015   SPINAL CORD STIMULATOR REMOVAL  ~ 04/2015   UPPER GI ENDOSCOPY     WISDOM TOOTH EXTRACTION      SOCIAL HISTORY: Social History   Socioeconomic History   Marital status: Married    Spouse name: Not on file   Number of children: 1   Years of education: Not on file   Highest education level: Not on file  Occupational History   Occupation: Disabled  Tobacco Use   Smoking status: Never   Smokeless tobacco: Never  Vaping Use   Vaping status: Never Used  Substance and Sexual Activity   Alcohol  use: Yes    Comment: 08/03/2015 glass of wine maybe twice/month   Drug use: No   Sexual activity: Yes    Comment: hysterectomy  Other Topics Concern   Not on file  Social History Narrative   Not on file   Social Drivers of Health   Financial Resource Strain: Not on file  Food Insecurity: No Food Insecurity (07/23/2023)   Hunger Vital Sign    Worried About Running Out of Food in the Last Year: Never true    Ran Out of Food in the Last Year: Never true  Transportation Needs: No Transportation Needs (07/23/2023)   PRAPARE - Administrator, Civil Service (Medical): No    Lack of Transportation (Non-Medical): No  Physical Activity: Not on file  Stress: Not on file  Social Connections: Unknown (07/23/2023)   Social Connection and Isolation Panel    Frequency of Communication with Friends and Family: Patient declined    Frequency of Social Gatherings with Friends and Family: Patient declined    Attends Religious Services: Patient declined    Database administrator or Organizations: Patient declined    Attends Banker Meetings: Patient declined    Marital Status: Married  Catering manager Violence: Not At Risk (07/23/2023)   Humiliation, Afraid, Rape, and Kick questionnaire    Fear of Current or Ex-Partner: No    Emotionally Abused: No    Physically Abused: No    Sexually Abused: No    FAMILY HISTORY: Family History   Problem Relation Age of Onset   Lung cancer Mother        smoker   Other Father        benigh tumor between heart and lungs, had surgery and something went wrong, he died at 59   Diabetes Paternal Grandmother    Heart disease Paternal Grandmother    Diabetes Paternal Grandfather    Heart disease Paternal Grandfather    Pancreatic cancer Paternal Grandfather    Liver cancer Paternal Grandfather    Breast cancer Maternal Aunt    Colon cancer Neg Hx    Esophageal cancer Neg Hx    Prostate cancer Neg Hx    Rectal cancer Neg Hx    Stomach cancer Neg Hx     ALLERGIES:  is allergic to keflex [cephalexin], penicillins, sulfa antibiotics, and  ozempic (0.25 or 0.5 mg-dose) [semaglutide(0.25 or 0.5mg -dos)].  MEDICATIONS:  Current Outpatient Medications  Medication Sig Dispense Refill   acetaminophen  (TYLENOL ) 650 MG CR tablet Take 1,300 mg by mouth 2 (two) times daily.     amantadine (SYMMETREL) 100 MG capsule Take 100 mg by mouth 2 (two) times daily.     amphetamine -dextroamphetamine  (ADDERALL) 30 MG tablet Take 30 mg by mouth 2 (two) times daily.     AUSTEDO XR 48 MG TB24 Take 1 tablet by mouth every evening.     clonazePAM  (KLONOPIN ) 2 MG tablet Take 1 mg by mouth 3 (three) times daily as needed for anxiety.     D3-50 1.25 MG (50000 UT) capsule Take 50,000 Units by mouth 3 (three) times a week. Take one capsule by mouth on Tuesdays, Thursdays and Saturdays per patient     dicyclomine  (BENTYL ) 10 MG capsule TAKE 1 CAPSULE BY MOUTH TWICE A DAY 180 capsule 1   fenofibrate 160 MG tablet Take 160 mg by mouth at bedtime.     hydrocortisone  (ANUSOL -HC) 25 MG suppository Place 1 suppository (25 mg total) rectally at bedtime. 10 suppository 0   lamoTRIgine (LAMICTAL) 200 MG tablet Take 400 mg by mouth at bedtime.     levonorgestrel-ethinyl estradiol (SEASONALE,INTROVALE,JOLESSA) 0.15-0.03 MG tablet Take 1 tablet by mouth in the morning.     levothyroxine  (SYNTHROID ) 125 MCG tablet Take 125 mcg by  mouth daily before breakfast.     lithium  carbonate (ESKALITH) 450 MG ER tablet Take 675 mg by mouth at bedtime.     minoxidil  (LONITEN ) 2.5 MG tablet Take 0.5 tablets (1.25 mg total) by mouth daily. 60 tablet 9   Nebivolol HCl 20 MG TABS Take 1 tablet by mouth daily.     nitrofurantoin (MACRODANTIN) 50 MG capsule Take 50 mg by mouth at bedtime.     omeprazole  (PRILOSEC) 40 MG capsule Take 1 capsule (40 mg total) by mouth daily.     ondansetron  (ZOFRAN ) 4 MG tablet Take 0.5-1 tablets (2-4 mg total) by mouth every 8 (eight) hours as needed for vomiting or nausea. 20 tablet 0   ramelteon (ROZEREM) 8 MG tablet Take 1 tablet by mouth at bedtime.     rOPINIRole (REQUIP) 3 MG tablet Take 3 mg by mouth at bedtime.     rosuvastatin (CRESTOR) 5 MG tablet Take 1 tablet by mouth at bedtime.     Safety Seal Miscellaneous MISC Apply 1 Application topically in the morning. Medication Name: Hormonic Hair Solution (Minoxidil  10%, Finasteride 0.1%) 30 g 6   telmisartan (MICARDIS) 40 MG tablet Take 40 mg by mouth at bedtime.     tiZANidine (ZANAFLEX) 4 MG tablet Take 8 mg by mouth 2 (two) times daily.     Current Facility-Administered Medications  Medication Dose Route Frequency Provider Last Rate Last Admin   acetaminophen  (TYLENOL ) tablet 650 mg  650 mg Oral Once Nyheem Binette T, PA-C       diphenhydrAMINE  (BENADRYL ) capsule 25 mg  25 mg Oral Once Oksana Deberry T, PA-C         PHYSICAL EXAMINATION:  ECOG PERFORMANCE STATUS: 1 - Symptomatic but completely ambulatory  Vitals:   12/10/23 1119 12/10/23 1122  BP: (!) 156/93 (!) 151/83  Pulse: 76   Resp: 16   Temp: 97.6 F (36.4 C)   SpO2: 100%    There were no vitals filed for this visit.   GENERAL:alert, no distress and comfortable SKIN:, .  skin color, texture, turgor are normal, no  rashes or significant lesions.  EYES: normal, conjunctiva are pink and non-injected, sclera clear LUNGS: clear to auscultation and percussion with normal breathing  effort HEART: regular rate & rhythm and no murmurs and no lower extremity edema Musculoskeletal:no cyanosis of digits and no clubbing  PSYCH: alert & oriented x 3 with fluent speech NEURO: no focal motor/sensory deficits  LABORATORY DATA:  I have reviewed the data as listed Lab Results  Component Value Date   WBC 9.3 12/10/2023   HGB 13.2 12/10/2023   HCT 40.9 12/10/2023   MCV 87.4 12/10/2023   PLT 401 (H) 12/10/2023     Chemistry      Component Value Date/Time   NA 134 (L) 12/10/2023 1019   NA 135 10/23/2023 1007   K 4.4 12/10/2023 1019   CL 101 12/10/2023 1019   CO2 22 12/10/2023 1019   BUN 14 12/10/2023 1019   BUN 13 10/23/2023 1007   CREATININE 0.92 12/10/2023 1019      Component Value Date/Time   CALCIUM 10.8 (H) 12/10/2023 1019   ALKPHOS 46 12/10/2023 1019   AST 27 12/10/2023 1019   ALT 23 12/10/2023 1019   BILITOT 0.3 12/10/2023 1019      RADIOGRAPHIC STUDIES: I have personally reviewed the radiological images as listed and agreed with the findings in the report. LONG TERM MONITOR (3-14 DAYS) Result Date: 11/29/2023 14 day monitor HR 51 - 114, average 81 bpm. No atrial fibrillation detected. Rare supraventricular ectopy. Rare ventricular ectopy. No sustained arrhythmias. Symptom trigger episodes correspond to sinus rhythm Eulas BRAVO [facility]   MM 3D SCREENING MAMMOGRAM BILATERAL BREAST Result Date: 11/27/2023 CLINICAL DATA:  Screening. EXAM: DIGITAL SCREENING BILATERAL MAMMOGRAM WITH TOMOSYNTHESIS AND CAD TECHNIQUE: Bilateral screening digital craniocaudal and mediolateral oblique mammograms were obtained. Bilateral screening digital breast tomosynthesis was performed. The images were evaluated with computer-aided detection. COMPARISON:  Previous exam(s). ACR Breast Density Category b: There are scattered areas of fibroglandular density. FINDINGS: There are no findings suspicious for malignancy. IMPRESSION: No mammographic evidence of  malignancy. A result letter of this screening mammogram will be mailed directly to the patient. RECOMMENDATION: Screening mammogram in one year. (Code:SM-B-01Y) BI-RADS CATEGORY  1: Negative. Electronically Signed   By: Curtistine Noble   On: 11/27/2023 13:18     ASSESSMENT & PLAN:  [name] is a 43 y.o. female who returns for follow-up for iron  deficiency anemia.    #Iron  deficiency anemia: --Unable to tolerate oral iron  due to GI intolerance --Underwent EGD on 07/25/22. Findings suggest mild gastric vacular ectasia which was quite friable and underlying source of IDA. Treated with APC.  --Under the care of Dr. Leigh in GI --Last received IV monoferric  1000 mg x 1 dose on 09/19/23  --Labs today show no evidence of anemia with Hgb 13.2, MCV 87.4. Iron  panel shows deficiency with TIBC 647, saturation 7%, ferritin pending. --Proceed with another round of IV monoferric  x 1 dose.  --We will add pre-meds to next IV monoferric  infusion to try to prevent peeling of skin.  --RTC in 3 months for lab check and 6 months for labs/follow up  #Hypercalcemia: --Calcium level has been elevated in the past around 10.8.  Patient denies taking any calcium supplements.  --will check PTH levels  #Liver lesions: --Seen on MR liver from 05/08/2023. Differentials include FNH or adenomas. AFP level was normal.  --MRI liver from 11/05/2023 showed unchanged arterially hyperenhancing liver lesions, largest again in the anterior right lobe of the liver, hepatic  segment V,measuring 2.8 x 2.0 cm, not significantly changed.  Findings are consistent with benign focal nodular hyperplasias.Hepatomegaly and severe hepatic steatosis. --Under surveillance by GI (Dr. Leigh).   #Right Ovarian Cyst: --Seen on US  pelvic US  on 05/08/2023. Planning to follow up with OB/GYN to continue to monitor.   All questions were answered. The patient knows to call the clinic with any problems, questions or concerns.  I have  spent a total of 25 minutes minutes of face-to-face and non-face-to-face time, preparing to see the patient,  performing a medically appropriate examination, counseling and educating the patient, ordering meds,  documenting clinical information in the electronic health record, and care coordination.    [name] ONEIDA Police, PA-C Hematology and Oncology [facility]

## 2023-12-12 ENCOUNTER — Telehealth: Payer: Self-pay

## 2023-12-12 NOTE — Telephone Encounter (Signed)
 [name], patient will be scheduled as soon as possible.  Auth Submission: NO AUTH NEEDED Site of care: Site of care: [facility] Payer: [employer] [place]  Medication & CPT/J Code(s) submitted: Monoferric  (Ferrci derisomaltose) [id number] Diagnosis Code:  Route of submission (phone, fax, portal): portal Phone # Fax # Auth type: Buy/Bill PB Units/visits requested: 1000mg  x 1 dose Reference number: [id number] Approval from: 12/12/23 to 03/04/24

## 2023-12-13 ENCOUNTER — Inpatient Hospital Stay

## 2023-12-13 DIAGNOSIS — D509 Iron deficiency anemia, unspecified: Secondary | ICD-10-CM | POA: Diagnosis not present

## 2023-12-13 LAB — CBC WITH DIFFERENTIAL (CANCER CENTER ONLY)
Abs Immature Granulocytes: 0.06 K/uL (ref 0.00–0.07)
Basophils Absolute: 0.1 K/uL (ref 0.0–0.1)
Basophils Relative: 1 %
Eosinophils Absolute: 0.2 K/uL (ref 0.0–0.5)
Eosinophils Relative: 2 %
HCT: 38.5 % (ref 36.0–46.0)
Hemoglobin: 12.3 g/dL (ref 12.0–15.0)
Immature Granulocytes: 1 %
Lymphocytes Relative: 15 %
Lymphs Abs: 1.2 K/uL (ref 0.7–4.0)
MCH: 28.1 pg (ref 26.0–34.0)
MCHC: 31.9 g/dL (ref 30.0–36.0)
MCV: 87.9 fL (ref 80.0–100.0)
Monocytes Absolute: 0.7 K/uL (ref 0.1–1.0)
Monocytes Relative: 9 %
Neutro Abs: 5.5 K/uL (ref 1.7–7.7)
Neutrophils Relative %: 72 %
Platelet Count: 362 K/uL (ref 150–400)
RBC: 4.38 MIL/uL (ref 3.87–5.11)
RDW: 14.4 % (ref 11.5–15.5)
WBC Count: 7.8 K/uL (ref 4.0–10.5)
nRBC: 0 % (ref 0.0–0.2)

## 2023-12-13 LAB — CMP (CANCER CENTER ONLY)
ALT: 26 U/L (ref 0–44)
AST: 33 U/L (ref 15–41)
Albumin: 4.7 g/dL (ref 3.5–5.0)
Alkaline Phosphatase: 46 U/L (ref 38–126)
Anion gap: 9 (ref 5–15)
BUN: 18 mg/dL (ref 6–20)
CO2: 22 mmol/L (ref 22–32)
Calcium: 10.1 mg/dL (ref 8.9–10.3)
Chloride: 105 mmol/L (ref 98–111)
Creatinine: 0.9 mg/dL (ref 0.44–1.00)
GFR, Estimated: >60 mL/min (ref >60)
Glucose, Bld: 207 mg/dL — ABNORMAL HIGH (ref 70–99)
Potassium: 4.3 mmol/L (ref 3.5–5.1)
Sodium: 136 mmol/L (ref 135–145)
Total Bilirubin: 0.3 mg/dL (ref 0.0–1.2)
Total Protein: 7.6 g/dL (ref 6.5–8.1)

## 2023-12-13 LAB — IRON AND IRON BINDING CAPACITY (CC-WL,HP ONLY)
Iron: 75 ug/dL (ref 28–170)
Saturation Ratios: 12 % (ref 10.4–31.8)
TIBC: 610 ug/dL — ABNORMAL HIGH (ref 250–450)
UIBC: 535 ug/dL — ABNORMAL HIGH (ref 148–442)

## 2023-12-13 LAB — FERRITIN: Ferritin: 70 ng/mL (ref 11–307)

## 2023-12-14 LAB — PTH, INTACT AND CALCIUM
Calcium, Total (PTH): 9.7 mg/dL (ref 8.7–10.2)
PTH: 19 pg/mL (ref 15–65)

## 2023-12-16 ENCOUNTER — Ambulatory Visit: Payer: Self-pay | Admitting: Physician Assistant

## 2023-12-17 ENCOUNTER — Ambulatory Visit

## 2023-12-17 DIAGNOSIS — Z906 Acquired absence of other parts of urinary tract: Secondary | ICD-10-CM

## 2023-12-17 DIAGNOSIS — T43505A Adverse effect of unspecified antipsychotics and neuroleptics, initial encounter: Secondary | ICD-10-CM

## 2023-12-17 DIAGNOSIS — K31819 Angiodysplasia of stomach and duodenum without bleeding: Secondary | ICD-10-CM | POA: Diagnosis not present

## 2023-12-17 DIAGNOSIS — D509 Iron deficiency anemia, unspecified: Secondary | ICD-10-CM

## 2023-12-17 DIAGNOSIS — K651 Peritoneal abscess: Secondary | ICD-10-CM

## 2023-12-17 DIAGNOSIS — R208 Other disturbances of skin sensation: Secondary | ICD-10-CM | POA: Diagnosis not present

## 2023-12-17 DIAGNOSIS — G2401 Drug induced subacute dyskinesia: Secondary | ICD-10-CM

## 2023-12-17 DIAGNOSIS — H547 Unspecified visual loss: Secondary | ICD-10-CM

## 2023-12-17 MED ORDER — GADOBENATE DIMEGLUMINE 529 MG/ML IV SOLN
20.0000 mL | Freq: Once | INTRAVENOUS | Status: AC | PRN
Start: 2023-12-17 — End: 2023-12-17
  Administered 2023-12-17: 20 mL via INTRAVENOUS

## 2023-12-22 ENCOUNTER — Other Ambulatory Visit

## 2023-12-22 ENCOUNTER — Encounter: Payer: Self-pay | Admitting: Dermatology

## 2023-12-22 DIAGNOSIS — L649 Androgenic alopecia, unspecified: Secondary | ICD-10-CM

## 2023-12-23 ENCOUNTER — Other Ambulatory Visit

## 2023-12-23 MED ORDER — MINOXIDIL 2.5 MG PO TABS: 2.5000 mg | ORAL_TABLET | Freq: Every day | ORAL | 2 refills | Status: DC

## 2023-12-24 ENCOUNTER — Ambulatory Visit

## 2023-12-24 VITALS — BP 165/96 | HR 77 | Temp 98.6°F | Resp 20 | Ht 66.0 in | Wt 252.0 lb

## 2023-12-24 DIAGNOSIS — E611 Iron deficiency: Secondary | ICD-10-CM

## 2023-12-24 DIAGNOSIS — D500 Iron deficiency anemia secondary to blood loss (chronic): Secondary | ICD-10-CM | POA: Diagnosis not present

## 2023-12-24 MED ORDER — LORATADINE 10 MG PO TABS: 10.0000 mg | ORAL_TABLET | Freq: Once | ORAL | Status: DC

## 2023-12-24 MED ORDER — SODIUM CHLORIDE 0.9 % IV SOLN
1000.0000 mg | Freq: Once | INTRAVENOUS | Status: AC
  Administered 2023-12-24: 1000 mg via INTRAVENOUS
  Filled 2023-12-24: qty 10

## 2023-12-24 MED ORDER — FAMOTIDINE IN NACL 20-0.9 MG/50ML-% IV SOLN
20.0000 mg | Freq: Once | INTRAVENOUS | Status: AC
  Administered 2023-12-24: 20 mg via INTRAVENOUS
  Filled 2023-12-24: qty 50

## 2023-12-24 NOTE — Progress Notes (Signed)
 Diagnosis: Iron  Deficiency Anemia  Provider:  Mannam, Praveen MD  Procedure: IV Infusion  IV Type: Peripheral, IV Location: L Antecubital   Pepcid  (famotidine ), Dose: 20 mg  Infusion Start Time: 1159  Infusion Stop Time: 1230   Monoferric  (Ferric Derisomaltose ), Dose: 1000 mg  Infusion Start Time: 1232  Infusion Stop Time: 1257  Post Infusion IV Care: Patient declined observation and Peripheral IV Discontinued  Discharge: Condition: Good, Destination: Home . AVS Declined  Performed by:  Rocky FORBES Sar, RN

## 2023-12-30 DIAGNOSIS — G2111 Neuroleptic induced parkinsonism: Secondary | ICD-10-CM | POA: Diagnosis not present

## 2023-12-30 DIAGNOSIS — G2401 Drug induced subacute dyskinesia: Secondary | ICD-10-CM | POA: Diagnosis not present

## 2023-12-30 DIAGNOSIS — F3174 Bipolar disorder, in full remission, most recent episode manic: Secondary | ICD-10-CM | POA: Diagnosis not present

## 2023-12-30 DIAGNOSIS — F3176 Bipolar disorder, in full remission, most recent episode depressed: Secondary | ICD-10-CM | POA: Diagnosis not present

## 2023-12-31 DIAGNOSIS — H21233 Degeneration of iris (pigmentary), bilateral: Secondary | ICD-10-CM | POA: Diagnosis not present

## 2024-01-01 DIAGNOSIS — R339 Retention of urine, unspecified: Secondary | ICD-10-CM | POA: Diagnosis not present

## 2024-01-06 ENCOUNTER — Encounter: Payer: Self-pay | Admitting: Physician Assistant

## 2024-01-13 NOTE — Telephone Encounter (Signed)
 Called [facility] again and spoke with staff member to have the notes faxed to us  for Dr Chalice to review.

## 2024-01-13 NOTE — Telephone Encounter (Signed)
 Received notes from [facility] visit 12/31/23.

## 2024-01-16 ENCOUNTER — Encounter: Payer: Self-pay | Admitting: Neurology

## 2024-01-22 ENCOUNTER — Other Ambulatory Visit (INDEPENDENT_AMBULATORY_CARE_PROVIDER_SITE_OTHER): Payer: Self-pay

## 2024-01-22 ENCOUNTER — Encounter: Payer: Self-pay | Admitting: Gastroenterology

## 2024-01-22 ENCOUNTER — Telehealth: Payer: Self-pay | Admitting: Neurology

## 2024-01-22 DIAGNOSIS — Z0289 Encounter for other administrative examinations: Secondary | ICD-10-CM

## 2024-01-22 NOTE — Telephone Encounter (Signed)
 Amyloidosis, h ATTR test sent today.Dedra Gores, MD

## 2024-01-22 NOTE — Telephone Encounter (Signed)
 Prevention genetics NAVIGAATR saliva test sample collected today in office today. Sample placed in pre-labeled [employer] bag with order form and given to front desk for pickup.   Test will be free for patient. From order sheet: Billing ID: [id number] Special Project Number: [id number]

## 2024-01-23 NOTE — Telephone Encounter (Signed)
 Update: received message from front desk yesterday 11/19 at 10:20 AM that package was picked up by [employer].

## 2024-01-31 DIAGNOSIS — R339 Retention of urine, unspecified: Secondary | ICD-10-CM | POA: Diagnosis not present

## 2024-02-03 ENCOUNTER — Encounter: Payer: Self-pay | Admitting: Gastroenterology

## 2024-02-03 DIAGNOSIS — D509 Iron deficiency anemia, unspecified: Secondary | ICD-10-CM

## 2024-02-03 DIAGNOSIS — R1030 Lower abdominal pain, unspecified: Secondary | ICD-10-CM

## 2024-02-04 NOTE — Telephone Encounter (Signed)
 Lab orders placed in EPIC for BMP and CBC.

## 2024-02-05 ENCOUNTER — Telehealth: Payer: Self-pay | Admitting: *Deleted

## 2024-02-05 ENCOUNTER — Other Ambulatory Visit

## 2024-02-05 DIAGNOSIS — D509 Iron deficiency anemia, unspecified: Secondary | ICD-10-CM | POA: Diagnosis not present

## 2024-02-05 DIAGNOSIS — R208 Other disturbances of skin sensation: Secondary | ICD-10-CM

## 2024-02-05 DIAGNOSIS — K31819 Angiodysplasia of stomach and duodenum without bleeding: Secondary | ICD-10-CM

## 2024-02-05 DIAGNOSIS — G2401 Drug induced subacute dyskinesia: Secondary | ICD-10-CM

## 2024-02-05 DIAGNOSIS — H547 Unspecified visual loss: Secondary | ICD-10-CM

## 2024-02-05 DIAGNOSIS — R1030 Lower abdominal pain, unspecified: Secondary | ICD-10-CM | POA: Diagnosis not present

## 2024-02-05 LAB — CBC WITH DIFFERENTIAL/PLATELET
Basophils Absolute: 0.1 K/uL (ref 0.0–0.1)
Basophils Relative: 1 % (ref 0.0–3.0)
Eosinophils Absolute: 0.1 K/uL (ref 0.0–0.7)
Eosinophils Relative: 1.4 % (ref 0.0–5.0)
HCT: 37.3 % (ref 36.0–46.0)
Hemoglobin: 12.7 g/dL (ref 12.0–15.0)
Lymphocytes Relative: 10.9 % — ABNORMAL LOW (ref 12.0–46.0)
Lymphs Abs: 1 K/uL (ref 0.7–4.0)
MCHC: 34 g/dL (ref 30.0–36.0)
MCV: 85.6 fl (ref 78.0–100.0)
Monocytes Absolute: 0.6 K/uL (ref 0.1–1.0)
Monocytes Relative: 6.7 % (ref 3.0–12.0)
Neutro Abs: 7.6 K/uL (ref 1.4–7.7)
Neutrophils Relative %: 80 % — ABNORMAL HIGH (ref 43.0–77.0)
Platelets: 309 K/uL (ref 150.0–400.0)
RBC: 4.36 Mil/uL (ref 3.87–5.11)
RDW: 16.8 % — ABNORMAL HIGH (ref 11.5–15.5)
WBC: 9.5 K/uL (ref 4.0–10.5)

## 2024-02-05 LAB — BASIC METABOLIC PANEL WITH GFR
BUN: 14 mg/dL (ref 6–23)
CO2: 21 meq/L (ref 19–32)
Calcium: 10.4 mg/dL (ref 8.4–10.5)
Chloride: 96 meq/L (ref 96–112)
Creatinine, Ser: 0.75 mg/dL (ref 0.40–1.20)
GFR: 96.83 mL/min (ref >60.00)
Glucose, Bld: 172 mg/dL — ABNORMAL HIGH (ref 70–99)
Potassium: 4.4 meq/L (ref 3.5–5.1)
Sodium: 130 meq/L — ABNORMAL LOW (ref 135–145)

## 2024-02-05 NOTE — Telephone Encounter (Signed)
 Negative amyloidosis test

## 2024-02-05 NOTE — Telephone Encounter (Signed)
 Received result via fax below.

## 2024-02-08 ENCOUNTER — Telehealth: Payer: Self-pay | Admitting: Neurology

## 2024-02-08 NOTE — Telephone Encounter (Signed)
 These are the referrals that can possibly help [name].   To make a diagnosis includes a range of testing, notably an autonomic reflex screen, tilt table test, and testing of the sudomotor response (ESC, QSART or thermoregulatory sweat test).     Referral for dysautonomia testing and treatment to :   1) [facility]  (Its a holistic clinic , run by Chiropractors but has reported success in treatments). Its the best accessible by location.   2) Second referral to [employer] center : [facility] , they mainly look at patients who had dysautonomia related to COVID infection.    3) if the patient has means and opportunity to travel , she should consider [facility], in  [place], which  has the largest dysautonomia center; [employer]

## 2024-02-08 NOTE — Telephone Encounter (Signed)
 At this time, I have no other pathway in mind.  I looked at your Symptoms :  Peripheral vision blurring. Hyper hydrosis Dysesthesia , pain and numbness.  Joint pain Thyroid  disease Fibromyalgia Low iron  IBS,  Bowel obstruction.   Bladder dysfunction with Interstitial cystitis.   Autonomic Nervous System (ANS) Dysfunction (Dysautonomia): Many of the symptoms point to a problem with the ANS, which controls involuntary functions like heart rate, digestion, temperature regulation, and bladder function. Hyperhidrosis (excessive sweating) or its opposite (anhidrosis) is a common symptom of dysautonomia. Gastrointestinal issues (IBS, constipation, bowel obstruction concerns) are frequent due to impaired gut motility. Bladder dysfunction (incontinence or retention) is also linked to autonomic neuropathy. Vision problems like blurring can be associated with dysautonomia, sometimes due to blood pressure changes upon standing (orthostatic intolerance). Dysesthesia (abnormal sensation) and joint pain are also reported symptoms. Functional Pain Syndromes: Interstitial cystitis (IC/BPS), fibromyalgia, and IBS are all considered functional pain syndromes that commonly coexist in the same individual and may involve a generalized dysfunction in pain or sensory processing. Autoimmune Links: Conditions like Hashimoto's thyroiditis (a common cause of hypothyroidism) are autoimmune disorders. Autoimmune diseases can cause peripheral neuropathy, leading to dysesthesia and potentially affecting autonomic nerves.   To make a diagnosis includes a range of testing, notably an autonomic reflex screen, tilt table test, and testing of the sudomotor response (ESC, QSART or thermoregulatory sweat test).   I would like to refer you to a [facility] , these are usually part of [facility].  It wont be an appointment soon, but we need to put you on the list.   Dedra Gores, MD

## 2024-02-09 ENCOUNTER — Ambulatory Visit: Payer: Self-pay | Admitting: Gastroenterology

## 2024-02-11 DIAGNOSIS — H21233 Degeneration of iris (pigmentary), bilateral: Secondary | ICD-10-CM | POA: Diagnosis not present

## 2024-02-13 NOTE — Addendum Note (Signed)
 Addended by: HILLIARD HEATHER CROME on: 02/13/2024 05:19 PM   Modules accepted: Orders

## 2024-02-17 NOTE — Addendum Note (Signed)
 Addended by: DAYNE SHERRY RAMAN on: 02/17/2024 12:33 PM   Modules accepted: Orders

## 2024-02-18 ENCOUNTER — Telehealth: Payer: Self-pay | Admitting: Neurology

## 2024-02-18 NOTE — Telephone Encounter (Signed)
 Referral to Neurology  for [facility] faxed to  [facility]   [facility]  [facility]   [facility] Phone: [phone] Fax:[phone]

## 2024-02-18 NOTE — Telephone Encounter (Signed)
 Referral to Neurology faxed to [facility] Phone [phone] Fax  [phone]

## 2024-02-19 NOTE — Progress Notes (Addendum)
 "                                                                                                        [facility]  INFECTIOUS DISEASE OUTPATIENT CONSULT NOTE ([facility])  [name] Date of Birth: [birth year] MRN: [id number] Patient seen and examined by me on 02/21/2024   Thalia Turkington is being seen in consultation at the request of Delon Greet for evaluation of New Patient (UTI)  Assessment 1.  History of cystectomy/urinary diversion/[place]  pouch  2.  Hyponatremia   Plan: She has a history of interstitial cystitis and has undergone cystectomy/urinary diversion and [place]  pouch.  She currently denies significant symptoms, although her urine cultures from 01/28/2024 were noted to be positive for low colony count E. coli which would not be unexpected..  She is here to establish care in case she would need infectious disease input in the future.  She has been on suppressive therapy with Macrobid and I have noted that her E. coli has developed resistance to the Macrobid and may not be an effective suppressive regimen and will hold for now.  She is also scheduled to follow-up with urology.  Will repeat a CMP per her request as she was noted to have hyponatremia on labs obtained 2 weeks ago  She will follow-up as planned      Please note that those non-infectious problems listed above as active problems directly impact the patient's risk for infection, ability to respond to treatment and clear infections, or impact the selection or dosing of antibiotics, or are directly influenced by infection, treatment of infection, or relate to monitoring of infection and/or potential toxicities of antimicrobial therapy.   HPI: [name] is a 43 y.o. female is being seen for UTI [name] is a 43 y.o. female being consulted for recurrent urinary tract infection She has a past medical history that is significant for iron  deficiency anemia,  alopecia, right shoulder pain she has a history of interstitial cystitis and has undergone cystectomy and urinary diversion/[place]  pouch..  She has had history of frequent urinary tract infections in the past.  However she notes that over the last few years she has had 1-2 infections.  She has been on suppressive therapy with Macrobid.  She did have a low colony count E. coli on urine cultures obtained on 01/28/2024 but currently does not report fever chills flank pain or change in color of urine and reports being fairly stable from a urinary standpoint..  She is here to establish care.   Medical History[1] Surgical History[2]  Medications: Current antibiotics/Antibiotic history: Drug: Start Date: End date: Duration/Other:                       Current Medications[3]   Allergies: Sulfasalazine, Semaglutide, Cephalexin, Penicillins, and Sulfamethoxazole   Social History:   Social History   Tobacco Use   Smoking status: Never   Smokeless tobacco: Never  Substance Use Topics   Alcohol  use: Yes     Family History: Family  History[4] I have reviewed family history and updated as appropriate  Immunizations: Immunization History  Administered Date(s) Administered   MMR 07/11/2023   Moderna SARS-CoV-2 Primary Series 12+ yrs 05/28/2019, 06/24/2019   Pfizer COVID-19 12+yrs (aka Comirnaty) 11/17/2021, 11/17/2022, 11/24/2023   Pfizer SARS-CoV-2 Bivalent 12+ yrs 12/20/2020   Pfizer SARS-CoV-2 Primary Series 12+ yrs 10/29/2019, 10/17/2020   mdfluenza, MDCK, trivalent, PF 11/17/2022, 11/24/2023    Review of Systems Gen: Denies fevers chills, denies  HEENT: Denies sore throat or difficulty swallowing, denies vision problems Neck: Denies Neck pain or neck stiffness Cardiovascular: Denies chest pain or palpitations Respiratory: Denies cough, denies productive sputum.  Denies chest pain, Denies SOB Gastrointestinal: Denies diarrhea.  Denies vomiting.  Denies abdominal  pain Genitourinary: Denies dysuria, denies hematuria.  Denies urethral discharge Skin: Denies skin rash denies.  Denies itching Musculoskeletal: Denies joint pain denies joint swelling.  Denies pain or redness in the extremities CNS: Denies headache.  Denies vision problems.  Denies weakness or numbness in any extremity Endocrine: Denies increased urinary frequency.  Denies fatigue Hematological: Denies bleeding tendency  Objective:  Physical Examination: Body mass index is 39.74 kg/m.  Vitals:   02/20/24 1325  BP: 142/88  Pulse:   Resp:   Temp:   SpO2:      General appearance - alert, well appearing, and in no distress and oriented to person, place, and time Mouth - mucous membranes moist, pharynx normal without lesions. Neck  -supple Chest - clear to auscultation, no wheezes, rales or rhonchi, symmetric air entry Heart - normal rate and regular rhythm Abdomen - soft, nontender, nondistended, no masses or organomegaly, [place]  pouch noted Neurological - alert, oriented, normal speech, no focal findings or movement disorder noted Extremities - no pedal edema noted Musculoskeletal-no joint swelling Endocrine: No thyroid  swelling Psych: mood and affect are appropriate    Labs: Recent Results (from the past week)  Comprehensive Metabolic Panel   Collection Time: 02/20/24  1:53 PM  Result Value Ref Range   Sodium 134 (L) 136 - 145 mmol/L   Potassium 4.7 3.5 - 5.1 mmol/L   Chloride 100 98 - 107 mmol/L   CO2 21 21 - 31 mmol/L   Anion Gap 13 6 - 14 mmol/L   Glucose, Random 141 (H) 70 - 99 mg/dL   Blood Urea Nitrogen (BUN) 10 7 - 25 mg/dL   Creatinine 9.11 9.39 - 1.20 mg/dL   eGFR 83 >40 fO/fpw/8.26f7   Albumin 5.0 3.5 - 5.7 g/dL   Total Protein 7.6 6.4 - 8.9 g/dL   Bilirubin, Total 0.4 0.3 - 1.0 mg/dL   Alkaline Phosphatase (ALP) 50 34 - 104 U/L   Aspartate Aminotransferase (AST) 30 13 - 39 U/L   Alanine Aminotransferase (ALT) 28 7 - 52 U/L   Calcium 10.5 (H) 8.6 -  10.3 mg/dL   BUN/Creatinine Ratio    .        Microbiology: @[id number] @  Imaging: ( personally reviewed by me):   No results found.  I have personally reviewed the Radiology report as noted: above I have independently reviewed the images as noted: above I have personally reviewed the diagnostic tests reports (biospy, smear, culture, etc.) as noted: above     Tawanna Sheen, MD [facility]-Infectious [facility]- [facility] [phone]   This record has been created using Dragon voice recognition software.           [1] Past Medical History: Diagnosis Date   Abdominal pain, unspecified site    Blood transfusion    Chronic interstitial cystitis    Constipation    Dyspareunia    Dysuria    Fibromyalgia    Nocturia    Pyuria    Thyroid  disease    Urge incontinence    Urinary frequency    Urinary hesitancy    Urinary urgency    Vaginal pain   [2] Past Surgical History: Procedure Laterality Date   ABDOMINAL SURGERY      Procedure: ABDOMINAL SURGERY   APPENDECTOMY     Procedure: APPENDECTOMY   BACK SURGERY  09/2009   Procedure: BACK SURGERY; Spinal cord stimulator placement   EXPLORATORY LAPAROTOMY N/A 08/19/2015   Procedure: LAPAROTOMY EXPLORATORY with abdominal wash out and drain placement, small bowel resection;  Surgeon: Lamar Lynwood Sar, MD;  Location: [facility] MAIN OR;  Service: Urology;  Laterality: N/A;   HYSTERECTOMY      Procedure: HYSTERECTOMY   IMPLANTATION / PLACEMENT EPIDURAL NEUROSTIMULATOR ELECTRODES     Procedure: IMPLANTATION / PLACEMENT EPIDURAL NEUROSTIMULATOR ELECTRODES   INJECTION URETHRAL N/A 05/19/2020   Procedure: CYSTOSCOPY WITH URETHRAL BULKING-bulkamid;  Surgeon: Lamar Lynwood Sar, MD;  Location: [facility] OUTPATIENT OR;  Service: Urology;  Laterality: N/A;  This pt has an [place]  poluch and will receive  injection into her cath limb    INJECTION URETHRAL N/A 01/19/2021   Procedure: BULKAMID URETHRAL INJECTION AND CYSTOSCOPY  this will be in an [place]  poiuch;  Surgeon: Lamar Lynwood Sar, MD;  Location: [facility] OUTPATIENT OR;  Service: Urology;  Laterality: N/A;   OTHER SURGICAL HISTORY     Procedure: OTHER SURGICAL HISTORY (bladder removal due interstial cystitis)   SKIN BIOPSY     Procedure: SKIN BIOPSY   SPINAL CORD STIMULATOR REMOVAL N/A 05/27/2015   Procedure: SPINAL CORD STIMULATOR GENERATOR REPLACEMENT;  Surgeon: Dan Hy, MD;  Location: [facility] MAIN OR;  Service: Stefanie Ast;  Laterality: N/A;  NEVRO REP   SPINAL CORD STIMULATOR REMOVAL N/A 11/27/2017   Procedure: SPINAL CORD STIMULATOR GENERATOR REMOVAL;  Surgeon: Dan Hy, MD;  Location: [facility] MAIN OR;  Service: Stefanie Ast;  Laterality: N/A;  NEVRO    URETHRA SURGERY     Procedure: URETHRA SURGERY; removed  [3]  Current Outpatient Medications:    amantadine (SYMMETREL) 100 mg capsule, Take 100 mg by mouth 2 (two) times a day., Disp: , Rfl:    cholecalciferol (VITAMIN D3) 1,250 mcg (50,000 unit) capsule, TAKE 1 CAPSULE BY MOUTH 3 TIMES A WEEK, Disp: , Rfl:    clonazePAM  (KlonoPIN ) 1 mg tablet, Take 1 mg by mouth 2 (two) times a day as needed for anxiety., Disp: , Rfl:    deutetrabenazine (Austedo XR) 48 mg Tb24, Take 48 mg by mouth daily., Disp: , Rfl:    dextroamphetamine -amphetamine  (ADDERALL) 30 mg tablet, Take 30 mg by mouth 2 (two) times a day., Disp: , Rfl:    dicyclomine  (BENTYL ) 10 mg capsule, Take 10 mg by mouth 3 (three) times a day., Disp: , Rfl:    fenofibrate (LOFIBRA) 160  mg tablet, fenofibrate 160 mg tablet, Disp: , Rfl:    lamoTRIgine (LaMICtal) 200 mg tablet, Take 2 tablets by mouth Once Daily., Disp: , Rfl:    levonorgestreL-ethinyl estrad 0.15 mg-30 mcg (91) 3MPk, TAKE 1 TABLET BY MOUTH EVERY DAY, Disp: , Rfl:    levothyroxine  (SYNTHROID ) 137 mcg tablet, Take 137 mcg by mouth Once  Daily., Disp: , Rfl:    lidocaine  (XYLOCAINE ) 5 % ointment, Place 1 gram to stoma daily prior to catherization., Disp: 30 g, Rfl: 2   lithium  (LITHOBID) 450 mg ER tablet, Take 1 tablet by mouth every evening., Disp: , Rfl:    MISCELLANEOUS MEDICATION/PRODUCT, Inject 80 mGentamycin IM tid x 5 days Disp 15 total doses, Disp: 15 each, Rfl: 0   nebivoloL (BYSTOLIC) 20 mg tablet, Take 20 mg by mouth daily., Disp: , Rfl:    nitrofurantoin (MACRODANTIN) 50 mg capsule, Take 1 capsule (50 mg total) by mouth daily., Disp: 90 capsule, Rfl: 3   omega-3 acid ethyl esters (LOVAZA) 1 gram capsule, , Disp: , Rfl:    omeprazole  (PriLOSEC) 40 mg DR capsule, TAKE 1 CAPSULE (40 MG TOTAL) BY MOUTH DAILY., Disp: , Rfl:    ondansetron  (ZOFRAN ) 4 mg tablet, Take 4 mg by mouth every 8 (eight) hours as needed for nausea., Disp: , Rfl:    ramelteon (ROZEREM) 8 mg tablet, Take 8 mg by mouth daily., Disp: , Rfl:    rOPINIRole (REQUIP) 2 mg tablet, TAKE 1 TABLETS 1-2 HOURS BEFORE BEDTIME AS DIRECTED 90 DAYS, Disp: , Rfl:    rosuvastatin (CRESTOR) 5 mg tablet, Take 5 mg by mouth Once Daily., Disp: , Rfl:    telmisartan (MICARDIS) 40 mg tab tablet, TAKE 1 TABLET BY MOUTH EVERY DAY FOR 30 DAYS, Disp: , Rfl:    tiZANidine (ZANAFLEX) 4 mg tablet, TAKE 2 TABLETS BY MOUTH TWICE A DAY FOR SPASMS, Disp: 360 tablet, Rfl: 3   diphenoxylate -atropine  (LOMOTIL ) 2.5-0.025 mg per tablet, TAKE 1 TABLET EVERY 6 HOURS AS NEEDED FOR DIARRHEA OR LOOSE STOOLS (Patient not taking: Reported on 02/20/2024), Disp: , Rfl:    gentamicin  (GARAMYCIN ) 40 mg/mL injection, Inject 2 mL (80 mg total) into the muscle 3 (three) times a day for 5 days. (Patient not taking: Reported on 02/20/2024), Disp: 30 mL, Rfl: 0   metoprolol  succinate (TOPROL  XL) 50 mg 24 hr tablet, metoprolol  succinate ER 50 mg tablet,extended release 24 hr (Patient not taking: Reported on 02/20/2024), Disp: , Rfl:    oxybutynin (DITROPAN) 5 mg/5 mL syrp syrup, Oxybutynin  5mg /55ml syrup. Instilled 5ml in pouch 4x/day. Dispense 473ml with refills. (Patient not taking: Reported on 02/20/2024), Disp: 473 mL, Rfl: 5 [4] Family History Problem Relation Name Age of Onset   Cancer Father     Cancer Mother     Anesthesia problems Neg Hx    "

## 2024-02-20 ENCOUNTER — Ambulatory Visit: Admitting: Neurology

## 2024-02-20 DIAGNOSIS — E871 Hypo-osmolality and hyponatremia: Secondary | ICD-10-CM | POA: Diagnosis not present

## 2024-02-20 DIAGNOSIS — Z9889 Other specified postprocedural states: Secondary | ICD-10-CM | POA: Diagnosis not present

## 2024-03-01 ENCOUNTER — Ambulatory Visit: Payer: Self-pay | Admitting: Neurology

## 2024-03-02 ENCOUNTER — Encounter: Payer: Self-pay | Admitting: Gastroenterology

## 2024-03-02 NOTE — Telephone Encounter (Signed)
 Linda/Dottie:  Ok to send in RX for Ondansetron  4mg  ODT one tab dissolve on tongue every 8 hours PRN # 30, no additional refills. If patient continues to have nausea I recommend scheduling her for an office visit for further evaluation with Dr. Leigh of [facility] THX.

## 2024-03-03 ENCOUNTER — Other Ambulatory Visit: Payer: Self-pay | Admitting: Gastroenterology

## 2024-03-03 DIAGNOSIS — K2970 Gastritis, unspecified, without bleeding: Secondary | ICD-10-CM

## 2024-03-03 MED ORDER — ONDANSETRON 4 MG PO TBDP: 4.0000 mg | ORAL_TABLET | Freq: Three times a day (TID) | ORAL | 0 refills | Status: DC | PRN

## 2024-03-03 NOTE — Telephone Encounter (Signed)
 Cara Elida HERO, NP to [facility]    03/02/24  5:00 PM Note Linda/Dottie:  Ok to send in RX for Ondansetron  4mg  ODT one tab dissolve on tongue every 8 hours PRN # 30, no additional refills. If patient continues to have nausea I recommend scheduling her for an office visit for further evaluation with Dr. Leigh of [facility] THX.

## 2024-03-11 ENCOUNTER — Inpatient Hospital Stay: Attending: Physician Assistant

## 2024-03-11 ENCOUNTER — Other Ambulatory Visit: Payer: Self-pay

## 2024-03-11 DIAGNOSIS — K56609 Unspecified intestinal obstruction, unspecified as to partial versus complete obstruction: Secondary | ICD-10-CM

## 2024-03-11 DIAGNOSIS — D509 Iron deficiency anemia, unspecified: Secondary | ICD-10-CM

## 2024-03-11 LAB — CBC WITH DIFFERENTIAL (CANCER CENTER ONLY)
Abs Immature Granulocytes: 0.12 K/uL — ABNORMAL HIGH (ref 0.00–0.07)
Basophils Absolute: 0.1 K/uL (ref 0.0–0.1)
Basophils Relative: 1 %
Eosinophils Absolute: 0.3 K/uL (ref 0.0–0.5)
Eosinophils Relative: 2 %
HCT: 39.5 % (ref 36.0–46.0)
Hemoglobin: 12.8 g/dL (ref 12.0–15.0)
Immature Granulocytes: 1 %
Lymphocytes Relative: 16 %
Lymphs Abs: 1.8 K/uL (ref 0.7–4.0)
MCH: 28.4 pg (ref 26.0–34.0)
MCHC: 32.4 g/dL (ref 30.0–36.0)
MCV: 87.8 fL (ref 80.0–100.0)
Monocytes Absolute: 0.6 K/uL (ref 0.1–1.0)
Monocytes Relative: 5 %
Neutro Abs: 8.6 K/uL — ABNORMAL HIGH (ref 1.7–7.7)
Neutrophils Relative %: 75 %
Platelet Count: 437 K/uL — ABNORMAL HIGH (ref 150–400)
RBC: 4.5 MIL/uL (ref 3.87–5.11)
RDW: 14.8 % (ref 11.5–15.5)
WBC Count: 11.5 K/uL — ABNORMAL HIGH (ref 4.0–10.5)
nRBC: 0 % (ref 0.0–0.2)

## 2024-03-11 LAB — CMP (CANCER CENTER ONLY)
ALT: 31 U/L (ref 0–44)
AST: 37 U/L (ref 15–41)
Albumin: 4.7 g/dL (ref 3.5–5.0)
Alkaline Phosphatase: 58 U/L (ref 38–126)
Anion gap: 15 (ref 5–15)
BUN: 13 mg/dL (ref 6–20)
CO2: 20 mmol/L — ABNORMAL LOW (ref 22–32)
Calcium: 10.4 mg/dL — ABNORMAL HIGH (ref 8.9–10.3)
Chloride: 101 mmol/L (ref 98–111)
Creatinine: 0.83 mg/dL (ref 0.44–1.00)
GFR, Estimated: >60 mL/min
Glucose, Bld: 167 mg/dL — ABNORMAL HIGH (ref 70–99)
Potassium: 4.6 mmol/L (ref 3.5–5.1)
Sodium: 135 mmol/L (ref 135–145)
Total Bilirubin: 0.2 mg/dL (ref 0.0–1.2)
Total Protein: 7.8 g/dL (ref 6.5–8.1)

## 2024-03-11 LAB — IRON AND IRON BINDING CAPACITY (CC-WL,HP ONLY)
Iron: 56 ug/dL (ref 28–170)
Saturation Ratios: 9 % — ABNORMAL LOW (ref 10.4–31.8)
TIBC: 595 ug/dL — ABNORMAL HIGH (ref 250–450)
UIBC: 539 ug/dL

## 2024-03-11 LAB — FERRITIN: Ferritin: 66 ng/mL (ref 11–307)

## 2024-03-12 ENCOUNTER — Other Ambulatory Visit: Payer: Self-pay | Admitting: Physician Assistant

## 2024-03-12 ENCOUNTER — Encounter: Payer: Self-pay | Admitting: Gastroenterology

## 2024-03-12 ENCOUNTER — Encounter: Payer: Self-pay | Admitting: Physician Assistant

## 2024-03-12 LAB — AFP TUMOR MARKER: AFP, Serum, Tumor Marker: 1.8 ng/mL (ref 0.0–6.4)

## 2024-03-13 ENCOUNTER — Encounter (HOSPITAL_COMMUNITY): Payer: Self-pay | Admitting: Physician Assistant

## 2024-03-20 ENCOUNTER — Telehealth (HOSPITAL_COMMUNITY): Payer: Self-pay | Admitting: Pharmacy Technician

## 2024-03-20 ENCOUNTER — Other Ambulatory Visit: Payer: Self-pay | Admitting: Dermatology

## 2024-03-20 DIAGNOSIS — L649 Androgenic alopecia, unspecified: Secondary | ICD-10-CM

## 2024-03-20 NOTE — Telephone Encounter (Signed)
 Auth Submission: NO AUTH NEEDED Site of care: [facility] Payer: [employer] [place] Medication & CPT/J Code(s) submitted: Monoferric  (Ferric derisomaltose ) J1437 Diagnosis Code: E61.1, D50.0 Route of submission (phone, fax, portal): CHECKED BY PHONE Phone # [phone] Fax # Auth type: Buy/Bill HB Units/visits requested: 1000MG  X 1 DOSE Reference number: [id number] Approval from: 03/20/2024 to 06/18/24    Dagoberto Armour, CPhT [facility] Phone: [phone] 03/20/2024

## 2024-03-26 ENCOUNTER — Ambulatory Visit (HOSPITAL_COMMUNITY)
Admission: RE | Admit: 2024-03-26 | Discharge: 2024-03-26 | Disposition: A | Discharge status: Home / Self Care | Source: Ambulatory Visit | Attending: Physician Assistant | Admitting: Physician Assistant

## 2024-03-26 VITALS — BP 150/101 | HR 74 | Temp 98.1°F | Resp 16

## 2024-03-26 DIAGNOSIS — E611 Iron deficiency: Secondary | ICD-10-CM | POA: Insufficient documentation

## 2024-03-26 DIAGNOSIS — D500 Iron deficiency anemia secondary to blood loss (chronic): Secondary | ICD-10-CM | POA: Diagnosis present

## 2024-03-26 MED ORDER — SODIUM CHLORIDE 0.9 % IV SOLN
INTRAVENOUS | Status: AC
  Filled 2024-03-26: qty 10

## 2024-03-26 MED ORDER — HYDROCORTISONE SOD SUC (PF) 100 MG IJ SOLR
100.0000 mg | Freq: Once | INTRAMUSCULAR | Status: AC
  Administered 2024-03-26: 100 mg via INTRAVENOUS

## 2024-03-26 MED ORDER — LORATADINE 10 MG PO TABS
10.0000 mg | ORAL_TABLET | Freq: Once | ORAL | Status: AC
  Administered 2024-03-26: 10 mg via ORAL

## 2024-03-26 MED ORDER — LORATADINE 10 MG PO TABS
ORAL_TABLET | ORAL | Status: AC
  Filled 2024-03-26: qty 1

## 2024-03-26 MED ORDER — SODIUM CHLORIDE 0.9 % IV SOLN
1000.0000 mg | Freq: Once | INTRAVENOUS | Status: AC
  Administered 2024-03-26: 1000 mg via INTRAVENOUS

## 2024-03-26 MED ORDER — HYDROCORTISONE SOD SUC (PF) 100 MG IJ SOLR
INTRAMUSCULAR | Status: AC
  Filled 2024-03-26: qty 2

## 2024-03-30 ENCOUNTER — Other Ambulatory Visit: Payer: Self-pay | Admitting: Nurse Practitioner

## 2024-04-01 ENCOUNTER — Telehealth: Payer: Self-pay | Admitting: Neurology

## 2024-04-01 ENCOUNTER — Ambulatory Visit: Admitting: Neurology

## 2024-04-01 ENCOUNTER — Encounter: Payer: Self-pay | Admitting: Neurology

## 2024-04-01 VITALS — BP 132/79 | HR 89 | Ht 66.0 in | Wt 242.0 lb

## 2024-04-01 DIAGNOSIS — K31819 Angiodysplasia of stomach and duodenum without bleeding: Secondary | ICD-10-CM

## 2024-04-01 DIAGNOSIS — D509 Iron deficiency anemia, unspecified: Secondary | ICD-10-CM

## 2024-04-01 DIAGNOSIS — K651 Peritoneal abscess: Secondary | ICD-10-CM | POA: Diagnosis not present

## 2024-04-01 DIAGNOSIS — G90A Postural orthostatic tachycardia syndrome (POTS): Secondary | ICD-10-CM

## 2024-04-01 DIAGNOSIS — R208 Other disturbances of skin sensation: Secondary | ICD-10-CM | POA: Diagnosis not present

## 2024-04-01 MED ORDER — ALPRAZOLAM 0.5 MG PO TABS: 0.5000 mg | ORAL_TABLET | Freq: Every evening | ORAL | 0 refills | Status: AC | PRN

## 2024-04-01 MED ORDER — ACETAZOLAMIDE 125 MG PO TABS: 125.0000 mg | ORAL_TABLET | Freq: Two times a day (BID) | ORAL | 2 refills | Status: AC

## 2024-04-01 NOTE — Telephone Encounter (Addendum)
 Referral form for [facility] for Autonomic Failure Referral Form for a tilt testing has been put in Dr. Lionell office in to sign box for her to review and to select one option.

## 2024-04-01 NOTE — Progress Notes (Signed)
 "        Provider:  Dedra Gores, MD  Primary Care Physician:  Janey Santos, MD [address] [place] [address]     Referring Provider: Janey Santos, Md [address] [place],  [place] [address]          Chief Complaint according to patient   Patient presents with:                HISTORY OF PRESENT ILLNESS:  [name] is a 44 y.o. female patient who is here for revisit 04/01/2024 for  diffuse unexplained pain, visual changes ( bright  lights, non-coloured , little flashes. Unprovoked, no trigger , no HA.  When standing for 20 minutes or longer  she feels also a kind of tunnel vision,  visual  field is clear in the center but cloudy hazy in there periphery.  Her  body trembles, her legs hurt-  increased fatigue, but also frustrated and depressed abut it.  Androgenic alopecia, cystitis,  iron  def. Anemia.   I cannot consolidate all symptoms into one syndrome.   The amyloidosis test was normal.   Iron  75 47 50 88 77 99 91  TIBC 610 High  647 High  624 High  588 High  543 High  580 High  512 High   Saturation Ratios 12 7 Low  8 Low  15 14 17 18   UIBC 535 High  600 High  CM 574 High  CM 500 High  CM 466 High  CM 481 High       Gets iron  IV.   Steroids helped  pain.    Chief concern according to patient :  recent febrile illness and worsening leg pain.  Barometric pressure makes HA and leg pain worse.     Fam Hx : see previous note  Social HX; see previous note       Review of Systems: Out of a complete 14 system review, the patient complains of only the following symptoms, and all other reviewed systems are negative.:    INSOMNIA due to pain.   Hyponatremia , while eating salty foods.       Social History   Socioeconomic History   Marital status: Married    Spouse name: Not on file   Number of children: 1   Years of education: Not on file   Highest education level: Not on file  Occupational History   Occupation: Disabled   Tobacco Use   Smoking status: Never   Smokeless tobacco: Never  Vaping Use   Vaping status: Never Used  Substance and Sexual Activity   Alcohol  use: Yes    Comment: 08/03/2015 glass of wine maybe twice/month   Drug use: No   Sexual activity: Yes    Comment: hysterectomy  Other Topics Concern   Not on file  Social History Narrative   Not on file   Social Drivers of Health   Tobacco Use: Low Risk (04/01/2024)   Patient History    Smoking Tobacco Use: Never    Smokeless Tobacco Use: Never    Passive Exposure: Not on file  Financial Resource Strain: Not on file  Food Insecurity: No Food Insecurity (07/23/2023)   Hunger Vital Sign    Worried About Running Out of Food in the Last Year: Never true    Ran Out of Food in the Last Year: Never true  Transportation Needs: No Transportation Needs (07/23/2023)   PRAPARE - Transportation    Lack of  Transportation (Medical): No    Lack of Transportation (Non-Medical): No  Physical Activity: Not on file  Stress: Not on file  Social Connections: Unknown (07/23/2023)   Social Connection and Isolation Panel    Frequency of Communication with Friends and Family: Patient declined    Frequency of Social Gatherings with Friends and Family: Patient declined    Attends Religious Services: Patient declined    Active Member of Clubs or Organizations: Patient declined    Attends Banker Meetings: Patient declined    Marital Status: Married  Depression (PHQ2-9): Not on file  Alcohol  Screen: Not on file  Housing: Low Risk (07/23/2023)   Housing Stability Vital Sign    Unable to Pay for Housing in the Last Year: No    Number of Times Moved in the Last Year: 0    Homeless in the Last Year: No  Utilities: Not At Risk (07/23/2023)   AHC Utilities    Threatened with loss of utilities: No  Health Literacy: Not on file    Family History  Problem Relation Age of Onset   Lung cancer Mother        smoker   Other Father        benigh  tumor between heart and lungs, had surgery and something went wrong, he died at 25   Diabetes Paternal Grandmother    Heart disease Paternal Grandmother    Diabetes Paternal Grandfather    Heart disease Paternal Grandfather    Pancreatic cancer Paternal Grandfather    Liver cancer Paternal Grandfather    Breast cancer Maternal Aunt    Colon cancer Neg Hx    Esophageal cancer Neg Hx    Prostate cancer Neg Hx    Rectal cancer Neg Hx    Stomach cancer Neg Hx     Past Medical History:  Diagnosis Date   ADHD (attention deficit hyperactivity disorder)    Allergy    Anemia    Anxiety    Arthritis, lumbar spine    Bipolar disorder (HCC)    Chronic back pain    Chronic headaches    Chronic interstitial cystitis    removed my bladder when I went to end stage IC   Depression    Disc degeneration, lumbar 01/10/2022   Fibromyalgia    GERD (gastroesophageal reflux disease)    Gestational diabetes    resolved   History of blood transfusion 02/2010   related to OR   History of kidney stones    HLD (hyperlipidemia)    Hypothyroid    IBS (irritable colon syndrome)    IDA (iron  deficiency anemia)    Interstitial cystitis 06/2008   Dr. Janit Meeter [facility]   Polycystic ovarian disease    Preeclampsia 2009   Self-catheterizes urinary bladder    I have an [place]  pouch; made a bladder using part of my small intestines; cath out of stoma in my belly button (08/02/2015)   Small bowel obstruction (HCC) 08/03/2015   Spina bifida (HCC) 01/10/2022   Thyroid  disease    Vitamin D deficiency     Past Surgical History:  Procedure Laterality Date   ABDOMINAL HYSTERECTOMY  02/2010   left my ovaries   APPENDECTOMY  02/2010   BLADDER REMOVAL  02/2010   and [place]  Pouch   BREAST BIOPSY Left 10/26/2019   CESAREAN SECTION  2009   CHOLECYSTECTOMY N/A 05/27/2017   Procedure: LAPAROSCOPIC CHOLECYSTECTOMY;  Surgeon: Rubin Calamity, MD;  Location: [facility] OR;  Service: General;  Laterality:  N/A;    COLONOSCOPY     ESOPHAGOGASTRODUODENOSCOPY (EGD) WITH PROPOFOL  N/A 07/25/2022   Procedure: ESOPHAGOGASTRODUODENOSCOPY (EGD) WITH PROPOFOL ;  Surgeon: Leigh Elspeth SQUIBB, MD;  Location: [facility] ENDOSCOPY;  Service: Gastroenterology;  Laterality: N/A;   HOT HEMOSTASIS N/A 07/25/2022   Procedure: HOT HEMOSTASIS (ARGON PLASMA COAGULATION/BICAP);  Surgeon: Leigh Elspeth SQUIBB, MD;  Location: [facility] ENDOSCOPY;  Service: Gastroenterology;  Laterality: N/A;   [place]  pouch  01/19/2021   Cystoscopy with bulk amid injection   LAPAROSCOPIC LYSIS OF ADHESIONS N/A 05/27/2017   Procedure: LAPAROSCOPIC LYSIS OF ADHESIONS;  Surgeon: Rubin Calamity, MD;  Location: [facility] OR;  Service: General;  Laterality: N/A;   OTHER SURGICAL HISTORY     remval of urethra when bladder was removed   SMALL INTESTINE SURGERY  2017   for bowel obstruction   SPINAL CORD STIMULATOR IMPLANT  09/2009; ~ 04/2015   SPINAL CORD STIMULATOR REMOVAL  ~ 04/2015   UPPER GI ENDOSCOPY     WISDOM TOOTH EXTRACTION       Medications Ordered Prior to Encounter[1]  Allergies[2]   DIAGNOSTIC DATA (LABS, IMAGING, TESTING) - I reviewed patient records, labs, notes, testing and imaging myself where available.  Lab Results  Component Value Date   WBC 11.5 (H) 03/11/2024   HGB 12.8 03/11/2024   HCT 39.5 03/11/2024   MCV 87.8 03/11/2024   PLT 437 (H) 03/11/2024      Component Value Date/Time   NA 135 03/11/2024 1111   NA 135 10/23/2023 1007   K 4.6 03/11/2024 1111   CL 101 03/11/2024 1111   CO2 20 (L) 03/11/2024 1111   GLUCOSE 167 (H) 03/11/2024 1111   BUN 13 03/11/2024 1111   BUN 13 10/23/2023 1007   CREATININE 0.83 03/11/2024 1111   CALCIUM 10.4 (H) 03/11/2024 1111   CALCIUM 9.7 12/13/2023 0835   PROT 7.8 03/11/2024 1111   PROT 7.6 10/23/2023 1007   ALBUMIN 4.7 03/11/2024 1111   ALBUMIN 4.9 10/23/2023 1007   AST 37 03/11/2024 1111   ALT 31 03/11/2024 1111   ALKPHOS 58 03/11/2024 1111   BILITOT 0.2 03/11/2024 1111   GFRNONAA >60  03/11/2024 1111   GFRAA 60 (L) 05/23/2017 1000   No results found for: CHOL, HDL, LDLCALC, LDLDIRECT, TRIG, CHOLHDL Lab Results  Component Value Date   HGBA1C 6.2 (H) 10/23/2023   Lab Results  Component Value Date   VITAMINB12 247 10/23/2023   Lab Results  Component Value Date   TSH 2.910 10/23/2023    PHYSICAL EXAM:  Vitals:   04/01/24 0924  BP: 132/79  Pulse: 89   No data found. Body mass index is 39.06 kg/m.   Wt Readings from Last 3 Encounters:  04/01/24 242 lb (109.8 kg)  12/24/23 252 lb (114.3 kg)  10/23/23 240 lb (108.9 kg)     Ht Readings from Last 3 Encounters:  04/01/24 5' 6 (1.676 m)  12/24/23 5' 6 (1.676 m)  12/10/23 5' 6 (1.676 m)      General: The patient is awake, alert and appears not in acute distress and groomed. Head: Normocephalic, atraumatic.  Neck is supple.   Cardiovascular:  Regular rate and cardiac rhythm by pulse, without distended neck veins. Respiratory: no shortness of breath  Skin:  With evidence of ankle edema, but no rash. Trunk: BMI is 39    NEUROLOGIC EXAM: The patient is awake and alert, oriented to place and time.   Memory subjective described as intact.  Attention span & concentration ability appears normal.  Speech is fluent,  without  dysarthria, dysphonia or aphasia.  Mood and affect are appropriate.   Neurological Examination: Mental Status: Intact. Language and speech are normal. No cognitive deficits. Cranial Nerves II-XII: Intact. PERL. EOMI. VFF. No nystagmus.  No facial droop.  No ptosis.  Hearing is grossly intact bilaterally.  The tongue is normal and midline. Motor: Strengths are 5/5 throughout. Muscle bulk and tone are normal. PLUS:  tremors.  Coordination: No ataxia or dysmetria.  Sensory: Grossly intact throughout to all modalities. Reflexes: Normal and symmetric throughout. Babinski's sign is absent bilaterally.  ASSESSMENT AND PLAN :   44 y.o. year old female  here with:     1) barometric pressure sensitivity, headaches, vision, started on diamox  250 mg bid  2)  LP ordered ,  for IIH work up.   3) no papilloedema on exam.  Ordered pituitary MRI and lumbar MRI      RV in 6 months if tests are normal.   Refill diamox  if subjectively helpful   Order tilt test at [facility] or [facility].   I would like to thank Avva, Ravisankar, MD and Avva, Ravisankar, Md [address] [place],  [place] [address] for allowing me to meet with this pleasant patient.   Sleep Clinic Patients are generally offered input on sleep hygiene, life style changes and how to improve compliance with medical treatment where applicable. Review and reiteration of good sleep hygiene measures is offered to any sleep clinic patient, be it in the first consultation or with any follow up visits.    Any patient with sleepiness should be cautioned not to drive, work at heights, or operate dangerous or heavy equipment when feeling tired or sleepy.      The patient will be seen in follow-up in the sleep clinic at [facility] for discussion of test results, sleep related symptoms and treatment compliance review, further management strategies, etc.   The referring provider will be notified of the test results.   The patient's condition requires frequent monitoring and adjustments in the treatment plan, reflecting the ongoing complexity of care.  This provider is the continuing focal point for all needed services for this condition.  After spending a total time of  35  minutes face to face and time for  history taking, physical and neurologic examination, review of laboratory studies,  personal review of imaging studies, reports and results of other testing and review of referral information / records as far as provided in visit,   Electronically signed by: Dedra Gores, MD 04/01/2024 10:03 AM  [facility] Board certified by The [employer] of Sleep Medicine and Diplomate of  the [employer] of Sleep Medicine. Board certified In Neurology through the ABPN, Fellow of the [employer] of Neurology.       [1]  Current Outpatient Medications on File Prior to Visit  Medication Sig Dispense Refill   acetaminophen  (TYLENOL ) 650 MG CR tablet Take 1,300 mg by mouth 2 (two) times daily.     amantadine (SYMMETREL) 100 MG capsule Take 100 mg by mouth 2 (two) times daily.     amphetamine -dextroamphetamine  (ADDERALL) 30 MG tablet Take 30 mg by mouth 2 (two) times daily.     AUSTEDO XR 48 MG TB24 Take 1 tablet by mouth every evening.     clonazePAM  (KLONOPIN ) 2 MG tablet Take 1 mg by mouth 3 (three) times daily as needed for anxiety.     D3-50 1.25 MG (50000 UT) capsule Take  50,000 Units by mouth 3 (three) times a week. Take one capsule by mouth on Tuesdays, Thursdays and Saturdays per patient     dicyclomine  (BENTYL ) 10 MG capsule TAKE 1 CAPSULE BY MOUTH TWICE A DAY 180 capsule 1   fenofibrate 160 MG tablet Take 160 mg by mouth at bedtime.     lamoTRIgine (LAMICTAL) 200 MG tablet Take 400 mg by mouth at bedtime.     levonorgestrel-ethinyl estradiol (SEASONALE,INTROVALE,JOLESSA) 0.15-0.03 MG tablet Take 1 tablet by mouth in the morning.     levothyroxine  (SYNTHROID ) 125 MCG tablet Take 125 mcg by mouth daily before breakfast.     lithium  carbonate (ESKALITH) 450 MG ER tablet Take 675 mg by mouth at bedtime.     minoxidil  (LONITEN ) 2.5 MG tablet TAKE 1 TABLET BY MOUTH EVERY DAY 90 tablet 0   Nebivolol HCl 20 MG TABS Take 1 tablet by mouth daily.     omeprazole  (PRILOSEC) 40 MG capsule TAKE 1 CAPSULE BY MOUTH TWICE A DAY 180 capsule 1   ondansetron  (ZOFRAN ) 4 MG tablet Take 0.5-1 tablets (2-4 mg total) by mouth every 8 (eight) hours as needed for vomiting or nausea. 20 tablet 0   ondansetron  (ZOFRAN -ODT) 4 MG disintegrating tablet DISSOLVE 1 TABLET BY MOUTH EVERY 8 HOURS AS NEEDED FOR NAUSEA OR VOMITING 30 tablet 0   ramelteon (ROZEREM) 8 MG tablet Take 1 tablet by  mouth at bedtime.     rOPINIRole (REQUIP) 3 MG tablet Take 3 mg by mouth at bedtime.     rosuvastatin (CRESTOR) 5 MG tablet Take 1 tablet by mouth at bedtime.     Safety Seal Miscellaneous MISC Apply 1 Application topically in the morning. Medication Name: Hormonic Hair Solution (Minoxidil  10%, Finasteride 0.1%) 30 g 6   telmisartan (MICARDIS) 40 MG tablet Take 40 mg by mouth at bedtime.     tiZANidine (ZANAFLEX) 4 MG tablet Take 8 mg by mouth 2 (two) times daily.     nitrofurantoin (MACRODANTIN) 50 MG capsule Take 50 mg by mouth at bedtime.     Current Facility-Administered Medications on File Prior to Visit  Medication Dose Route Frequency Provider Last Rate Last Admin   acetaminophen  (TYLENOL ) tablet 650 mg  650 mg Oral Once Thayil, Irene T, PA-C       diphenhydrAMINE  (BENADRYL ) capsule 25 mg  25 mg Oral Once Thayil, Irene T, PA-C      [2]  Allergies Allergen Reactions   Keflex [Cephalexin] Hives    Can take with antihistamine   Penicillins Hives and Other (See Comments)    PATIENT HAS HAD A PCN REACTION WITH IMMEDIATE RASH, FACIAL/TONGUE/THROAT SWELLING, SOB, OR LIGHTHEADEDNESS WITH HYPOTENSION:  #  #  #  YES  #  #  #   Has patient had a PCN reaction causing severe rash involving mucus membranes or skin necrosis: No Has patient had a PCN reaction that required hospitalization No Has patient had a PCN reaction occurring within the last 10 years: No If all of the above answers are NO, then may proceed with Cephalosporin use.    Sulfa Antibiotics Hives   Ozempic (0.25 Or 0.5 Mg-Dose) [Semaglutide(0.25 Or 0.5mg -Dos)] Other (See Comments)    Caused Abdominal block   "

## 2024-04-01 NOTE — Patient Instructions (Addendum)
 ASSESSMENT AND PLAN :    44 y.o. year old female  here with:     1) barometric pressure sensitivity, headaches, vision, started on diamox  250 mg bid   2)  LP ordered ,  for IIH work up.  Needs to be at [facility] or [facility], I like for the patient to have a Lumbar  MRI first and MRI brain repeat.    3) no papilloedema on exam.    RV in 6 months if tests are normal.   Refill diamox  if subjectively helpful    Order tilt test at [facility] or [facility].    I would like to thank Avva, Ravisankar, MD and Avva, Ravisankar, Md [address] [place],  [place] [address] for allowing me to meet with this pleasant patient.

## 2024-04-01 NOTE — Addendum Note (Signed)
 Addended by: CHALICE SAUNAS on: 04/01/2024 10:32 AM   Modules accepted: Orders

## 2024-04-09 ENCOUNTER — Other Ambulatory Visit: Payer: Self-pay | Admitting: Neurology

## 2024-04-09 DIAGNOSIS — R208 Other disturbances of skin sensation: Secondary | ICD-10-CM

## 2024-04-09 DIAGNOSIS — H547 Unspecified visual loss: Secondary | ICD-10-CM

## 2024-04-09 DIAGNOSIS — G90A Postural orthostatic tachycardia syndrome (POTS): Secondary | ICD-10-CM

## 2024-04-09 NOTE — Progress Notes (Signed)
 Re order for LP and labs entered  DRI  [id number]

## 2024-04-15 ENCOUNTER — Other Ambulatory Visit

## 2024-04-30 ENCOUNTER — Ambulatory Visit: Admitting: Dermatology

## 2024-06-09 ENCOUNTER — Ambulatory Visit: Admitting: Physician Assistant

## 2024-06-09 ENCOUNTER — Other Ambulatory Visit

## 2024-06-22 ENCOUNTER — Other Ambulatory Visit
